# Patient Record
Sex: Female | Born: 1990 | Race: Black or African American | Hispanic: No | Marital: Single | Smoking: Never smoker
Health system: Southern US, Community
[De-identification: ages and names within clinical notes are randomized; demographics above are authoritative.]

## PROBLEM LIST (undated history)

## (undated) ENCOUNTER — Inpatient Hospital Stay (HOSPITAL_COMMUNITY): Payer: Self-pay

## (undated) DIAGNOSIS — A749 Chlamydial infection, unspecified: Secondary | ICD-10-CM

## (undated) DIAGNOSIS — R51 Headache: Secondary | ICD-10-CM

## (undated) DIAGNOSIS — O039 Complete or unspecified spontaneous abortion without complication: Secondary | ICD-10-CM

## (undated) HISTORY — PX: NO PAST SURGERIES: SHX2092

---
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
## 2012-01-02 ENCOUNTER — Encounter (HOSPITAL_COMMUNITY): Payer: Self-pay | Admitting: *Deleted

## 2012-01-02 ENCOUNTER — Inpatient Hospital Stay (HOSPITAL_COMMUNITY)
Admission: AD | Admit: 2012-01-02 | Discharge: 2012-01-03 | Disposition: A | Discharge status: Home / Self Care | Payer: 59 | Source: Ambulatory Visit | Attending: Obstetrics and Gynecology | Admitting: Obstetrics and Gynecology

## 2012-01-02 DIAGNOSIS — O99891 Other specified diseases and conditions complicating pregnancy: Secondary | ICD-10-CM | POA: Insufficient documentation

## 2012-01-02 DIAGNOSIS — O4700 False labor before 37 completed weeks of gestation, unspecified trimester: Secondary | ICD-10-CM

## 2012-01-02 LAB — URINALYSIS, ROUTINE W REFLEX MICROSCOPIC
Hgb urine dipstick: NEGATIVE
Leukocytes, UA: NEGATIVE
Nitrite: NEGATIVE
Protein, ur: NEGATIVE mg/dL
Urobilinogen, UA: 4 mg/dL — ABNORMAL HIGH (ref 0.0–1.0)

## 2012-01-02 NOTE — MAU Note (Signed)
Pt G3 P0 at 33.2wks leaking clear fluid since 2030 and having lower back pain.

## 2012-01-02 NOTE — MAU Provider Note (Signed)
  History     CSN: [id number]  Arrival date and time: 01/02/12 2057   None     Chief Complaint  Patient presents with  . Rupture of Membranes   HPI 21 yo G3P0020 @ 33+2 p/w c/o leaking fluid.  She states she got out of the shower and had a gush of fluid.  She denies continued leaking fluid.  She states she has had some dysuria. Denies vaginal bleeding.  Active fetal movement and ocassional cramping. Her pregnancy has been uncomplicated up to this point. Last sexual activity 1 week ago    Past Medical History  Diagnosis Date  . Headache   . Miscarriage     x2, one at 13weeks, one at [redacted] weeks gestation    Past Surgical History  Procedure Date  . No past surgeries     Family History  Problem Relation Age of Onset  . Hypertension Mother   . Depression Maternal Aunt   . Diabetes Maternal Aunt   . Depression Maternal Uncle   . Diabetes Maternal Uncle   . Asthma Paternal Aunt   . Cancer Paternal Aunt   . Depression Paternal Aunt   . Diabetes Paternal Aunt   . Asthma Paternal Uncle   . Depression Paternal Uncle   . Diabetes Paternal Uncle   . Birth defects Cousin   . Anesthesia problems Neg Hx   . Hypotension Neg Hx   . Malignant hyperthermia Neg Hx   . Pseudochol deficiency Neg Hx     History  Substance Use Topics  . Smoking status: Never Smoker   . Smokeless tobacco: Never Used  . Alcohol Use: Yes     Comment: social    Allergies:  Allergies  Allergen Reactions  . Fruit Blend Other (See Comments)    Sore throat from eating acidic fresh fruits    Prescriptions prior to admission  Medication Sig Dispense Refill  . Prenatal Vit-Fe Fumarate-FA (MULTIVITAMIN-PRENATAL) 27-0.8 MG TABS Take 1 tablet by mouth daily.        ROS: as above Physical Exam   Blood pressure 133/66, pulse 107, temperature 98.2 F (36.8 C), temperature source Oral, resp. rate 18, height 5\' 6"  (1.676 m), weight 92.443 kg (203 lb 12.8 oz), last menstrual period 05/14/2011.  Physical  Exam AOx3, NAD Gravid soft, NT FHT 130-140 reassuring NEFG, perineum dry, 2 small lesions that appear to be molluscum Vagina rugated, scant d/c, no pool Cvx closed, thick, high  WP: fern negative  MAU Course  Procedures 1) WP: fern negative 2) U/A   Assessment and Plan  1) Check U/A 2) No evidence of ROM. If u/a negative with D/C home. If c/w UTI will D/C home with abx  Crystall Donaldson H. 01/02/2012, 9:55 PM

## 2012-01-02 NOTE — MAU Note (Signed)
Pt feels like she broke her water about ago, unsure if fluid continues to leak

## 2012-01-23 NOTE — L&D Delivery Note (Signed)
Delivery Note Called to stand by for delivery as Dr was in another delivery.  When I entered her room, the head was crowning.   At 6:24 PM a viable and healthy female was delivered via  (Presentation:OA ;  ).  APGAR:9/9, ; weight .   Placenta status: Delivered spontaneously and grossly intact with 3VC  No difficulty with shoulders.   Anesthesia:   Episiotomy: None  Lacerations: shallow right labial/perifurethral noted, not bleeding, not repaired.  Suture Repair:  Est. Blood Loss (mL):   Mom to postpartum.  Baby to nursery-stable.  Wynelle Bourgeois 02/22/2012, 6:42 PM

## 2012-01-23 NOTE — L&D Delivery Note (Signed)
Appreciate Marie's attendance.  Pt comfortable and doing well postpartum.  No complaints.  Baby girl doing well.

## 2012-01-24 LAB — OB RESULTS CONSOLE GBS: GBS: NEGATIVE

## 2012-02-16 ENCOUNTER — Inpatient Hospital Stay (HOSPITAL_COMMUNITY)
Admission: AD | Admit: 2012-02-16 | Discharge: 2012-02-16 | Disposition: A | Discharge status: Home / Self Care | Payer: 59 | Source: Ambulatory Visit | Attending: Obstetrics & Gynecology | Admitting: Obstetrics & Gynecology

## 2012-02-16 ENCOUNTER — Encounter (HOSPITAL_COMMUNITY): Payer: Self-pay | Admitting: *Deleted

## 2012-02-16 DIAGNOSIS — O99891 Other specified diseases and conditions complicating pregnancy: Secondary | ICD-10-CM

## 2012-02-16 DIAGNOSIS — N949 Unspecified condition associated with female genital organs and menstrual cycle: Secondary | ICD-10-CM

## 2012-02-16 DIAGNOSIS — N898 Other specified noninflammatory disorders of vagina: Secondary | ICD-10-CM

## 2012-02-16 LAB — WET PREP, GENITAL: Yeast Wet Prep HPF POC: NONE SEEN

## 2012-02-16 MED ORDER — VALACYCLOVIR HCL 500 MG PO TABS: 500.0000 mg | ORAL_TABLET | Freq: Two times a day (BID) | ORAL | Status: DC

## 2012-02-16 NOTE — MAU Note (Signed)
I'm having vaginal irritation and swelling. Some occ abdominal pains

## 2012-02-16 NOTE — MAU Provider Note (Signed)
History     CSN: [id number]  Arrival date and time: 02/16/12 2131   None     Chief Complaint  Patient presents with  . Vaginal Pain   HPI  Pt is a G3P0020 at 38.4 wks IUP here with report that perineum is swollen and burns x 1 day.  Last intercourse three days ago.  Denies HSV or MRSA.  No report of itching.    Past Medical History  Diagnosis Date  . Headache   . Miscarriage     x2, one at 13weeks, one at [redacted] weeks gestation    Past Surgical History  Procedure Date  . No past surgeries     Family History  Problem Relation Age of Onset  . Hypertension Mother   . Depression Maternal Aunt   . Diabetes Maternal Aunt   . Depression Maternal Uncle   . Diabetes Maternal Uncle   . Asthma Paternal Aunt   . Cancer Paternal Aunt   . Depression Paternal Aunt   . Diabetes Paternal Aunt   . Asthma Paternal Uncle   . Depression Paternal Uncle   . Diabetes Paternal Uncle   . Birth defects Cousin   . Anesthesia problems Neg Hx   . Hypotension Neg Hx   . Malignant hyperthermia Neg Hx   . Pseudochol deficiency Neg Hx     History  Substance Use Topics  . Smoking status: Former Games developer  . Smokeless tobacco: Never Used  . Alcohol Use: Yes     Comment: social    Allergies:  Allergies  Allergen Reactions  . Fruit Blend Other (See Comments)    Sore throat from eating acidic fresh fruits    Prescriptions prior to admission  Medication Sig Dispense Refill  . guaiFENesin (ROBITUSSIN) 100 MG/5ML liquid Take 200 mg by mouth 3 (three) times daily as needed. cough      . Prenatal Vit-Fe Fumarate-FA (PRENATAL MULTIVITAMIN) TABS Take 1 tablet by mouth daily.        Review of Systems  Genitourinary:       Vaginal pain and burning  All other systems reviewed and are negative.   Physical Exam   Blood pressure 132/72, pulse 89, temperature 97.9 F (36.6 C), temperature source Oral, resp. rate 20, height 5' 7.5" (1.715 m), weight 95.8 kg (211 lb 3.2 oz), last menstrual period  05/14/2011.  Physical Exam  Constitutional: She is oriented to person, place, and time. She appears well-developed and well-nourished. No distress.  HENT:  Head: Normocephalic.  Neck: Normal range of motion. Neck supple.  Cardiovascular: Normal rate, regular rhythm and normal heart sounds.   Respiratory: Effort normal and breath sounds normal.  GI: Soft. There is no tenderness.  Genitourinary:    No bleeding around the vagina. Vaginal discharge (thick white) found.  Neurological: She is alert and oriented to person, place, and time.  Skin: Skin is warm and dry.  FHR 130's, +accels Toco - None  MAU Course  Procedures Results for orders placed during the hospital encounter of 02/16/12 (from the past 24 hour(s))  WET PREP, GENITAL     Status: Abnormal   Collection Time   02/16/12 10:15 PM      Component Value Range   Yeast Wet Prep HPF POC NONE SEEN  NONE SEEN   Trich, Wet Prep NONE SEEN  NONE SEEN   Clue Cells Wet Prep HPF POC NONE SEEN  NONE SEEN   WBC, Wet Prep HPF POC FEW (*) NONE SEEN  Consulted with Dr. Arlyce Dice > reviewed HPI/exam > begin Valtrex BID with follow-up in office.  Assessment and Plan   Vaginal Lesion   Plan: DC to home RX Valtrex HSV Culture pending Pt education provided.  [facility] 02/16/2012, 10:06 PM

## 2012-02-16 NOTE — MAU Note (Signed)
PT SAYS HER PERINEUM  IS SWOLLEN  AND BURNS- STARTED Friday NIGHT-  LAST SEX - ON WED .   DENIES HSV AND MRSA.   NO ITCHING.

## 2012-02-18 LAB — HERPES SIMPLEX VIRUS CULTURE

## 2012-02-22 ENCOUNTER — Inpatient Hospital Stay (HOSPITAL_COMMUNITY)
Admission: AD | Admit: 2012-02-22 | Discharge: 2012-02-24 | DRG: 775 | Disposition: A | Discharge status: Home / Self Care | Payer: 59 | Source: Ambulatory Visit | Attending: Obstetrics and Gynecology | Admitting: Obstetrics and Gynecology

## 2012-02-22 ENCOUNTER — Encounter (HOSPITAL_COMMUNITY): Payer: Self-pay | Admitting: Anesthesiology

## 2012-02-22 ENCOUNTER — Inpatient Hospital Stay (HOSPITAL_COMMUNITY): Payer: 59 | Admitting: Anesthesiology

## 2012-02-22 ENCOUNTER — Encounter (HOSPITAL_COMMUNITY): Payer: Self-pay | Admitting: *Deleted

## 2012-02-22 DIAGNOSIS — N898 Other specified noninflammatory disorders of vagina: Secondary | ICD-10-CM

## 2012-02-22 HISTORY — DX: Chlamydial infection, unspecified: A74.9

## 2012-02-22 LAB — COMPREHENSIVE METABOLIC PANEL
ALT: 10 U/L (ref 0–35)
Alkaline Phosphatase: 114 U/L (ref 39–117)
CO2: 22 mEq/L (ref 19–32)
Chloride: 102 mEq/L (ref 96–112)
GFR calc Af Amer: >90 mL/min (ref >90)
GFR calc non Af Amer: >90 mL/min (ref >90)
Glucose, Bld: 89 mg/dL (ref 70–99)
Potassium: 3.7 mEq/L (ref 3.5–5.1)
Sodium: 134 mEq/L — ABNORMAL LOW (ref 135–145)

## 2012-02-22 LAB — CBC
Hemoglobin: 10 g/dL — ABNORMAL LOW (ref 12.0–15.0)
MCH: 30.1 pg (ref 26.0–34.0)
RBC: 3.32 MIL/uL — ABNORMAL LOW (ref 3.87–5.11)

## 2012-02-22 LAB — URINALYSIS, DIPSTICK ONLY
Bilirubin Urine: NEGATIVE
Nitrite: NEGATIVE
Specific Gravity, Urine: 1.025 (ref 1.005–1.030)
pH: 7 (ref 5.0–8.0)

## 2012-02-22 MED ORDER — CITRIC ACID-SODIUM CITRATE 334-500 MG/5ML PO SOLN: 30.0000 mL | ORAL | Status: DC | PRN

## 2012-02-22 MED ORDER — ACETAMINOPHEN 325 MG PO TABS: 650.0000 mg | ORAL_TABLET | ORAL | Status: DC | PRN

## 2012-02-22 MED ORDER — FENTANYL 2.5 MCG/ML BUPIVACAINE 1/10 % EPIDURAL INFUSION (WH - ANES)
14.0000 mL/h | INTRAMUSCULAR | Status: DC
  Filled 2012-02-22: qty 125

## 2012-02-22 MED ORDER — PRENATAL MULTIVITAMIN CH
1.0000 | ORAL_TABLET | Freq: Every day | ORAL | Status: DC
  Administered 2012-02-24: 1 via ORAL
  Filled 2012-02-22 (×2): qty 1

## 2012-02-22 MED ORDER — TETANUS-DIPHTH-ACELL PERTUSSIS 5-2.5-18.5 LF-MCG/0.5 IM SUSP
0.5000 mL | Freq: Once | INTRAMUSCULAR | Status: AC
  Administered 2012-02-24: 0.5 mL via INTRAMUSCULAR

## 2012-02-22 MED ORDER — WITCH HAZEL-GLYCERIN EX PADS: 1.0000 "application " | MEDICATED_PAD | CUTANEOUS | Status: DC | PRN

## 2012-02-22 MED ORDER — ZOLPIDEM TARTRATE 5 MG PO TABS: 5.0000 mg | ORAL_TABLET | Freq: Every evening | ORAL | Status: DC | PRN

## 2012-02-22 MED ORDER — BENZOCAINE-MENTHOL 20-0.5 % EX AERO
1.0000 "application " | INHALATION_SPRAY | CUTANEOUS | Status: DC | PRN
  Administered 2012-02-22 – 2012-02-24 (×2): 1 via TOPICAL
  Filled 2012-02-22 (×2): qty 56

## 2012-02-22 MED ORDER — LACTATED RINGERS IV SOLN
500.0000 mL | Freq: Once | INTRAVENOUS | Status: AC
  Administered 2012-02-22: 500 mL via INTRAVENOUS

## 2012-02-22 MED ORDER — IBUPROFEN 600 MG PO TABS: 600.0000 mg | ORAL_TABLET | Freq: Four times a day (QID) | ORAL | Status: DC | PRN

## 2012-02-22 MED ORDER — PHENYLEPHRINE 40 MCG/ML (10ML) SYRINGE FOR IV PUSH (FOR BLOOD PRESSURE SUPPORT): 80.0000 ug | PREFILLED_SYRINGE | INTRAVENOUS | Status: DC | PRN

## 2012-02-22 MED ORDER — ONDANSETRON HCL 4 MG/2ML IJ SOLN: 4.0000 mg | INTRAMUSCULAR | Status: DC | PRN

## 2012-02-22 MED ORDER — LIDOCAINE HCL (PF) 1 % IJ SOLN
INTRAMUSCULAR | Status: DC | PRN
  Administered 2012-02-22 (×2): 9 mL

## 2012-02-22 MED ORDER — ONDANSETRON HCL 4 MG PO TABS: 4.0000 mg | ORAL_TABLET | ORAL | Status: DC | PRN

## 2012-02-22 MED ORDER — IBUPROFEN 600 MG PO TABS
600.0000 mg | ORAL_TABLET | Freq: Four times a day (QID) | ORAL | Status: DC
  Administered 2012-02-23 – 2012-02-24 (×7): 600 mg via ORAL
  Filled 2012-02-22 (×7): qty 1

## 2012-02-22 MED ORDER — ONDANSETRON HCL 4 MG/2ML IJ SOLN: 4.0000 mg | Freq: Four times a day (QID) | INTRAMUSCULAR | Status: DC | PRN

## 2012-02-22 MED ORDER — DIPHENHYDRAMINE HCL 25 MG PO CAPS: 25.0000 mg | ORAL_CAPSULE | Freq: Four times a day (QID) | ORAL | Status: DC | PRN

## 2012-02-22 MED ORDER — DIPHENHYDRAMINE HCL 50 MG/ML IJ SOLN: 12.5000 mg | INTRAMUSCULAR | Status: DC | PRN

## 2012-02-22 MED ORDER — SIMETHICONE 80 MG PO CHEW: 80.0000 mg | CHEWABLE_TABLET | ORAL | Status: DC | PRN

## 2012-02-22 MED ORDER — EPHEDRINE 5 MG/ML INJ: 10.0000 mg | INTRAVENOUS | Status: DC | PRN

## 2012-02-22 MED ORDER — OXYCODONE-ACETAMINOPHEN 5-325 MG PO TABS
1.0000 | ORAL_TABLET | ORAL | Status: DC | PRN
  Administered 2012-02-23: 1 via ORAL
  Filled 2012-02-22: qty 1

## 2012-02-22 MED ORDER — DIBUCAINE 1 % RE OINT: 1.0000 "application " | TOPICAL_OINTMENT | RECTAL | Status: DC | PRN

## 2012-02-22 MED ORDER — SENNOSIDES-DOCUSATE SODIUM 8.6-50 MG PO TABS
2.0000 | ORAL_TABLET | Freq: Every day | ORAL | Status: DC
  Administered 2012-02-23 (×2): 2 via ORAL

## 2012-02-22 MED ORDER — PHENYLEPHRINE 40 MCG/ML (10ML) SYRINGE FOR IV PUSH (FOR BLOOD PRESSURE SUPPORT)
80.0000 ug | PREFILLED_SYRINGE | INTRAVENOUS | Status: DC | PRN
  Filled 2012-02-22: qty 5

## 2012-02-22 MED ORDER — LACTATED RINGERS IV SOLN
INTRAVENOUS | Status: DC
  Administered 2012-02-22: 16:00:00 via INTRAVENOUS

## 2012-02-22 MED ORDER — OXYTOCIN 40 UNITS IN LACTATED RINGERS INFUSION - SIMPLE MED
62.5000 mL/h | INTRAVENOUS | Status: DC
  Filled 2012-02-22: qty 1000

## 2012-02-22 MED ORDER — EPHEDRINE 5 MG/ML INJ
10.0000 mg | INTRAVENOUS | Status: DC | PRN
  Filled 2012-02-22: qty 4

## 2012-02-22 MED ORDER — LANOLIN HYDROUS EX OINT: TOPICAL_OINTMENT | CUTANEOUS | Status: DC | PRN

## 2012-02-22 MED ORDER — OXYCODONE-ACETAMINOPHEN 5-325 MG PO TABS: 1.0000 | ORAL_TABLET | ORAL | Status: DC | PRN

## 2012-02-22 MED ORDER — FENTANYL 2.5 MCG/ML BUPIVACAINE 1/10 % EPIDURAL INFUSION (WH - ANES)
INTRAMUSCULAR | Status: DC | PRN
  Administered 2012-02-22: 14 mL/h via EPIDURAL

## 2012-02-22 MED ORDER — OXYTOCIN BOLUS FROM INFUSION: 500.0000 mL | INTRAVENOUS | Status: DC

## 2012-02-22 MED ORDER — LACTATED RINGERS IV SOLN: 500.0000 mL | INTRAVENOUS | Status: DC | PRN

## 2012-02-22 MED ORDER — LIDOCAINE HCL (PF) 1 % IJ SOLN
30.0000 mL | INTRAMUSCULAR | Status: DC | PRN
  Filled 2012-02-22: qty 30

## 2012-02-22 NOTE — Anesthesia Preprocedure Evaluation (Signed)
Anesthesia Evaluation  Patient identified by MRN, date of birth, ID band Patient awake    Reviewed: Allergy & Precautions, H&P , NPO status , Patient's Chart, lab work & pertinent test results  Airway Mallampati: II TM Distance: >3 FB Neck ROM: full    Dental No notable dental hx.    Pulmonary neg pulmonary ROS,    Pulmonary exam normal       Cardiovascular negative cardio ROS      Neuro/Psych negative psych ROS   GI/Hepatic negative GI ROS, Neg liver ROS,   Endo/Other  negative endocrine ROS  Renal/GU negative Renal ROS  negative genitourinary   Musculoskeletal negative musculoskeletal ROS (+)   Abdominal Normal abdominal exam  (+)   Peds negative pediatric ROS (+)  Hematology negative hematology ROS (+)   Anesthesia Other Findings   Reproductive/Obstetrics (+) Pregnancy                           Anesthesia Physical Anesthesia Plan  ASA: II  Anesthesia Plan: Epidural   Post-op Pain Management:    Induction:   Airway Management Planned:   Additional Equipment:   Intra-op Plan:   Post-operative Plan:   Informed Consent: I have reviewed the patients History and Physical, chart, labs and discussed the procedure including the risks, benefits and alternatives for the proposed anesthesia with the patient or authorized representative who has indicated his/her understanding and acceptance.     Plan Discussed with:   Anesthesia Plan Comments:         Anesthesia Quick Evaluation  

## 2012-02-22 NOTE — Anesthesia Procedure Notes (Signed)
Epidural Patient location during procedure: OB Start time: 02/22/2012 3:17 PM End time: 02/22/2012 3:20 PM  Staffing Anesthesiologist: Sandrea Hughs Performed by: anesthesiologist   Preanesthetic Checklist Completed: patient identified, site marked, surgical consent, pre-op evaluation, timeout performed, IV checked, risks and benefits discussed and monitors and equipment checked  Epidural Patient position: sitting Prep: site prepped and draped and DuraPrep Patient monitoring: continuous pulse ox and blood pressure Approach: midline Injection technique: LOR air  Needle:  Needle type: Tuohy  Needle gauge: 17 G Needle length: 9 cm and 9 Needle insertion depth: 7 cm Catheter type: closed end flexible Catheter size: 19 Gauge Catheter at skin depth: 12 cm Test dose: negative and Other  Assessment Sensory level: T8 Events: blood not aspirated, injection not painful, no injection resistance, negative IV test and no paresthesia  Additional Notes Reason for block:procedure for pain

## 2012-02-22 NOTE — MAU Note (Signed)
Patient states she is having contractions every 5 minutes. Denies bleeding or leaking. Reports good fetal movement.  

## 2012-02-22 NOTE — MAU Note (Signed)
UC's since 0500, intense. States MD stripped membranes 2 days and was 1cm.

## 2012-02-22 NOTE — H&P (Signed)
22 y.o. [redacted]w[redacted]d  G3P0020 comes in c/o ctx.  Otherwise has good fetal movement and no bleeding.  Past Medical History  Diagnosis Date  . Headache   . Miscarriage     x2, one at 13weeks, one at [redacted] weeks gestation  . Chlamydia     Past Surgical History  Procedure Date  . No past surgeries     OB History    Grav Para Term Preterm Abortions TAB SAB Ect Mult Living   3 0 0 0 2 0 2 0 0 0      # Outc Date GA Lbr Len/2nd Wgt Sex Del Anes PTL Lv   1 SAB            2 SAB            3 CUR               History   Social History  . Marital Status: Single    Spouse Name: N/A    Number of Children: N/A  . Years of Education: N/A   Occupational History  . Not on file.   Social History Main Topics  . Smoking status: Former Games developer  . Smokeless tobacco: Never Used  . Alcohol Use: Yes     Comment: social  . Drug Use: No  . Sexually Active: Yes    Birth Control/ Protection: None   Other Topics Concern  . Not on file   Social History Narrative  . No narrative on file   Fruit blend    Prenatal Transfer Tool  Maternal Diabetes: No Genetic Screening: Normal Fetal Ultrasounds or other Referrals:  Other:  no abnormalities note for anatomy scan Maternal Substance Abuse:  No Significant Maternal Medications:  None Significant Maternal Lab Results: Lab values include: Group B Strep negative  Other PNC:    Filed Vitals:   02/22/12 1546  BP: 93/49  Pulse: 108  Temp:   Resp:      Lungs/Cor:  NAD Abdomen:  soft, gravid Ex:  no cords, erythema SVE: 4.5/80/-1, now 7/100/0 FHTs:  125, good STV, NST R Toco:  q2-3   A/P   Admit in labor  GBS neg  Epidural received  AROM clear  Routine care  [place], Darlene Livingston

## 2012-02-23 LAB — CBC
HCT: 26.3 % — ABNORMAL LOW (ref 36.0–46.0)
Hemoglobin: 8.9 g/dL — ABNORMAL LOW (ref 12.0–15.0)
RBC: 2.97 MIL/uL — ABNORMAL LOW (ref 3.87–5.11)

## 2012-02-23 MED ORDER — INFLUENZA VIRUS VACC SPLIT PF IM SUSP
0.5000 mL | Freq: Once | INTRAMUSCULAR | Status: AC
  Administered 2012-02-23: 0.5 mL via INTRAMUSCULAR
  Filled 2012-02-23: qty 0.5

## 2012-02-23 NOTE — Anesthesia Postprocedure Evaluation (Signed)
Anesthesia Post Note  Patient: Darlene Livingston  Procedure(s) Performed: * No procedures listed *  Anesthesia type: Epidural  Patient location: Mother/Baby  Post pain: Pain level controlled  Post assessment: Post-op Vital signs reviewed  Last Vitals:  Filed Vitals:   02/23/12 0525  BP: 126/85  Pulse: 80  Temp: 36.4 C  Resp: 18    Post vital signs: Reviewed  Level of consciousness:alert  Complications: No apparent anesthesia complications

## 2012-02-24 MED ORDER — IBUPROFEN 600 MG PO TABS: 600.0000 mg | ORAL_TABLET | Freq: Four times a day (QID) | ORAL | Status: DC

## 2012-02-24 MED ORDER — OXYCODONE-ACETAMINOPHEN 5-325 MG PO TABS: 1.0000 | ORAL_TABLET | ORAL | Status: DC | PRN

## 2012-02-24 NOTE — Discharge Summary (Signed)
Obstetric Discharge Summary Reason for Admission: onset of labor Prenatal Procedures: none Intrapartum Procedures: spontaneous vaginal delivery Postpartum Procedures: none Complications-Operative and Postpartum: none Hemoglobin  Date Value Range Status  02/23/2012 8.9* 12.0 - 15.0 g/dL Final     HCT  Date Value Range Status  02/23/2012 26.3* 36.0 - 46.0 % Final    Physical Exam:  General: alert and cooperative Lochia: appropriate Uterine Fundus: firm DVT Evaluation: No evidence of DVT seen on physical exam.  Discharge Diagnoses: Term Pregnancy-delivered  Discharge Information: Date: 02/24/2012 Activity: pelvic rest Diet: routine Medications: PNV, Ibuprofen and Percocet Condition: stable Instructions: refer to practice specific booklet Discharge to: home Follow-up Information    Follow up with Rae Sutcliffe, DO. In 4 weeks.   Contact information:   [address] [place] [address] [phone]          Newborn Data: Live born female  Birth Weight: 6 lb 2.9 oz (2805 g) APGAR: 9, 9  Home with mother.  Darlene Livingston 02/24/2012, 7:44 AM

## 2012-04-07 ENCOUNTER — Emergency Department (HOSPITAL_COMMUNITY)
Admission: EM | Admit: 2012-04-07 | Discharge: 2012-04-07 | Disposition: A | Discharge status: Home / Self Care | Payer: 59 | Attending: Emergency Medicine | Admitting: Emergency Medicine

## 2012-04-07 ENCOUNTER — Encounter (HOSPITAL_COMMUNITY): Payer: Self-pay | Admitting: Emergency Medicine

## 2012-04-07 DIAGNOSIS — D1800 Hemangioma unspecified site: Secondary | ICD-10-CM

## 2012-04-07 DIAGNOSIS — Z8619 Personal history of other infectious and parasitic diseases: Secondary | ICD-10-CM | POA: Insufficient documentation

## 2012-04-07 DIAGNOSIS — D1801 Hemangioma of skin and subcutaneous tissue: Secondary | ICD-10-CM | POA: Insufficient documentation

## 2012-04-07 DIAGNOSIS — Z87891 Personal history of nicotine dependence: Secondary | ICD-10-CM | POA: Insufficient documentation

## 2012-04-07 MED ORDER — HYDROCODONE-ACETAMINOPHEN 5-325 MG PO TABS
1.0000 | ORAL_TABLET | Freq: Once | ORAL | Status: AC
  Administered 2012-04-07: 1 via ORAL
  Filled 2012-04-07: qty 1

## 2012-04-07 NOTE — ED Provider Notes (Signed)
History     CSN: [id number]  Arrival date & time 04/07/12  0006   First MD Initiated Contact with Patient 04/07/12 0106      Chief Complaint  Patient presents with  . Headache   HPI  History provided by the patient. Patient is a 22 year old female with history of migraine headaches who presents with 2 complaints of migraine headache as well as right scalp irritation from a birthmark. Patient states she has a birthmark to her right scalp since she was born but normally stays flat with redness but in the past has caused a small area of growth which she had removed by a dermatologist. She states that over the past several months there has been a new growth to the area which recently became irritated and cut with bleeding. She has had some bleeding every time she rubs the area when she sleeps or combs her hair. Patient also states this irritation has caused her to have her migraine headaches which have been unrelieved by ibuprofen at home. She denies any other associated symptoms or complaints. No fever, chills or sweats. No numbness weakness in extremities.    Past Medical History  Diagnosis Date  . Headache   . Miscarriage     x2, one at 13weeks, one at [redacted] weeks gestation  . Chlamydia     Past Surgical History  Procedure Laterality Date  . No past surgeries      Family History  Problem Relation Age of Onset  . Hypertension Mother   . Depression Maternal Aunt   . Diabetes Maternal Aunt   . Depression Maternal Uncle   . Diabetes Maternal Uncle   . Asthma Paternal Aunt   . Cancer Paternal Aunt   . Depression Paternal Aunt   . Diabetes Paternal Aunt   . Asthma Paternal Uncle   . Depression Paternal Uncle   . Diabetes Paternal Uncle   . Birth defects Cousin   . Anesthesia problems Neg Hx   . Hypotension Neg Hx   . Malignant hyperthermia Neg Hx   . Pseudochol deficiency Neg Hx     History  Substance Use Topics  . Smoking status: Former Games developer  . Smokeless tobacco: Never  Used  . Alcohol Use: Yes     Comment: social    OB History   Grav Para Term Preterm Abortions TAB SAB Ect Mult Living   3 1 1  0 2 0 2 0 0 1      Review of Systems  Constitutional: Negative for fever and chills.  Neurological: Positive for headaches.  Psychiatric/Behavioral: Negative for confusion.  All other systems reviewed and are negative.    Allergies  Fruit blend  Home Medications   Current Outpatient Rx  Name  Route  Sig  Dispense  Refill  . ibuprofen (ADVIL,MOTRIN) 600 MG tablet   Oral   Take 1 tablet (600 mg total) by mouth every 6 (six) hours.   30 tablet   0   . oxyCODONE-acetaminophen (PERCOCET/ROXICET) 5-325 MG per tablet   Oral   Take 1-2 tablets by mouth every 4 (four) hours as needed (moderate - severe pain).   6 tablet   0   . Prenatal Vit-Fe Fumarate-FA (PRENATAL MULTIVITAMIN) TABS   Oral   Take 1 tablet by mouth daily.           BP 121/70  Pulse 68  Temp(Src) 98.6 F (37 C) (Oral)  Resp 18  Ht 5\' 7"  (1.702 m)  Wt 190  lb (86.183 kg)  BMI 29.75 kg/m2  SpO2 100%  LMP 04/06/2012  Breastfeeding? No  Physical Exam  Nursing note and vitals reviewed. Constitutional: She is oriented to person, place, and time. She appears well-developed and well-nourished. No distress.  HENT:  Head: Normocephalic and atraumatic.  Eyes: Conjunctivae and EOM are normal. Pupils are equal, round, and reactive to light.  Neck: Normal range of motion. Neck supple.  No meningeal signs  Cardiovascular: Normal rate and regular rhythm.   No murmur heard. Pulmonary/Chest: Effort normal and breath sounds normal. No respiratory distress. She has no wheezes. She has no rales.  Abdominal: Soft. There is no tenderness. There is no rigidity, no rebound, no guarding, no CVA tenderness and no tenderness at McBurney's point.  Neurological: She is alert and oriented to person, place, and time. She has normal strength. No cranial nerve deficit or sensory deficit. Gait normal.   Skin: Skin is warm and dry. No rash noted.  Erythematous patch to the right parietal scalp consistent with history of hemangioma. There is a small 3 mm pedunculated flesh-colored growth to the superior posterior aspect of this with underlying dry blood. No active bleeding. There is no fluctuance to the skin. No signs concerning for secondary skin infection.  Psychiatric: She has a normal mood and affect. Her behavior is normal.    ED Course  Procedures     1. Hemangioma       MDM  Patient seen and evaluated. Patient well-appearing in no acute distress. Normal nonfocal neuro exam.          Angus Seller, PA-C 04/07/12 0151

## 2012-04-07 NOTE — ED Notes (Signed)
Pt states she has a birth mark in hair that is growing and causing her to have headaches. Pt states she had it removed when she was 11-20 yrs old, since being pregnant area has grown significantly. PWD. VSS.

## 2012-04-07 NOTE — ED Provider Notes (Signed)
Medical screening examination/treatment/procedure(s) were performed by non-physician practitioner and as supervising physician I was immediately available for consultation/collaboration.  Laiana Fratus K Linker, MD 04/07/12 0156 

## 2012-04-15 ENCOUNTER — Encounter: Payer: Self-pay | Admitting: Medical

## 2012-04-15 ENCOUNTER — Telehealth: Payer: Self-pay | Admitting: Family Medicine

## 2012-04-15 ENCOUNTER — Ambulatory Visit (INDEPENDENT_AMBULATORY_CARE_PROVIDER_SITE_OTHER): Payer: 59 | Admitting: Medical

## 2012-04-15 VITALS — BP 110/80 | HR 68 | Temp 98.3°F | Resp 16 | Wt 194.0 lb

## 2012-04-15 DIAGNOSIS — L42 Pityriasis rosea: Secondary | ICD-10-CM

## 2012-04-15 DIAGNOSIS — L989 Disorder of the skin and subcutaneous tissue, unspecified: Secondary | ICD-10-CM

## 2012-04-15 NOTE — Patient Instructions (Signed)
Pityriasis Rosea - the rash on back, chest, abdomen, arms.  This will resolve on its on in several weeks.   It may get worse appearing before it gets better.   If the itching is real bad, you can use short term Benadryl OTC, 1/2-1 tablet (up to 25mg ), 3 times daily, but I would limit this.   Cool packs can help with itching.   Aveeno lotion or Lubriderm moisturizing lotion can also help with the itchy rash.  We will refer you back to dermatology to recheck the scalp lesion.  What is pityriasis rosea? Pityriasis rosea (say "pih-tih-RY-uh-sus ROH-zee-uh") is a common skin problem that causes a rash. Although it can occur at any age, it is seen most often in those between the ages of 51 and 47.  Pityriasis rosea is usually harmless. But it can cause serious problems in pregnant women. See a picture of pityriasis rosea. What causes pityriasis rosea? Experts aren't sure what causes pityriasis rosea. Unlike many other skin conditions, it is not an allergic reaction or caused by a fungus or bacteria. And there aren't signs that it is caused by a virus. But something irritates the skin and causes the rash. What are the symptoms? Pityriasis rosea causes a rash. The rash often begins with a single, round or oval, pink patch that is scaly with a raised border (herald patch). The size of the patch ranges from 2 cm (0.8 in.) to 10 cm (3.9 in.). The larger patches are more common. See a picture of a herald patch.  Days to weeks later, salmon-colored, 1 cm (0.4 in.) to 2 cm (0.8 in.) oval patches appear in batches on the abdomen, chest, back, arms, and legs. Patches sometimes spread to the neck but rarely to the face.  Patches on the back are often vertical and angled to form a "Christmas tree" or "fir tree" appearance.  The rash does not cause itching in 25% of people who have pityriasis rosea. For 50% of people, the itch is mild to moderate. And for 25% of people, the itch is severe.1  The rash usually lasts 6  to 8 weeks, but it can last up to several months. The rash may take other forms. Rounded bumps (papular rash) may be seen in young children, pregnant women, and people with dark skin. Blisters (vesicular rash) may be seen in infants and young children. In some people, the herald patch may not appear, or two herald patches may appear close together. Before the herald patch appears, you may feel tired and as though you have a cold. You may have a headache, nausea, sore throat, and loss of appetite. The pityriasis rosea rash is similar to the rash seen in other skin conditions, including ringworm of the skin, tinea versicolor, eczema, and psoriasis. A rash similar to pityriasis rosea also can be caused by syphilis and by certain medicines such as antibiotics. If you get a rash on the palms of your hands or the soles of your feet, see your doctor. This can be a sign of something more serious than pityriasis rosea. How is pityriasis rosea diagnosed? Your doctor will diagnose pityriasis rosea by looking at the rash. Diagnosis can be hard when only the herald patch is visible, because the condition is often mistaken for ringworm or eczema at this time. After the rash appears, diagnosis is generally clear. If the diagnosis is unclear, your doctor may do a potassium hydroxide (KOH) test to make sure the rash is not caused by a  fungal infection. A skin sample may be taken from the infected area and examined under the microscope (biopsy). If the diagnosis is unclear in a sexually active person, a test for syphilis is often done.

## 2012-04-15 NOTE — Telephone Encounter (Signed)
Message copied by Janeice Robinson on Tue Apr 15, 2012  4:00 PM ------      Message from: Jac Canavan      Created: Tue Apr 15, 2012  1:07 PM       Refer to dermatology.  She has seen derm on [address] in the past, but wants whoever will see her soonest, [place], [place], etc.             Scalp lesion. ------

## 2012-04-15 NOTE — Telephone Encounter (Signed)
Patient is aware of her appointment to see Darlene Livingston on 04/17/12 @ 845 pm at [facility]. CLS [phone]

## 2012-04-15 NOTE — Progress Notes (Signed)
Subjective: Here as a new patient for skin concerns.  She is breastfeeding, just had baby 02/22/12.    She reports breaking out rash on chest, stomach, back, and arms.  Rash x 2 wk, worsening.  Rash is itching.   Has had this before.   She saw dermatology on [address] in the past, was diagnosed with "rosacea."  Diagnosis 1-2 year ago.  At that time was advised to use Aveeno.  She reports that rash started with a flat itch spot on her back, then on chest, then abdomen, low back, arms.  She notes some redness, itching.  No scaling, no pus, no warmth.  She has second skin concern.  Hx/o "birthmark" on right scalp, has 2 growth in the area that are crusting and getting bigger, and at times painful and flaky. Had this happen when she was young, had this cut off.   Now there are 2 growths.    Past Medical History  Diagnosis Date  . Headache   . Miscarriage     x2, one at 13weeks, one at [redacted] weeks gestation  . Chlamydia    ROS as in subjective   Objective: Gen: wd, wn, nad, AA female Skin: right parietal scalp with elongated slightly raised broad flat pink lesions, somewhat thickened compared to surrounding skin with 2 separate pedunculated crusting lesions with separation at the base.  Given all the crusted debris intermixed within the hair, hard to appreciate the pedunculated growths.  The flat pink base and 2 growths on top of the base are all within hairline.  Possible herald patch on left mid back, roundish flat, pink/red lesion, similar smaller scattered lesions on lower back, lower abdomen, left breast laterally, forearms.    Assessment: Encounter Diagnoses  Name Primary?  . Pityriasis rosea Yes  . Scalp lesion   . Lactating mother    Plan: pityriasis rosea - discussed findings, diagnosis, reassured she didn't have Rosacea.  Advised aveeno or lubriderm cream for itching, cool packs, and if needed, occasional low dose benadryl oral OTC.  Gave handout on diagnosis, and advised that this  will resolve spontaneously in the weeks to come.  Scalp lesion - refer to dermatology for eval and management.  Unclear etiology, and growing irritated lesion.   Differential includes seborrheic dermatitis, verruca, hemangioma, AK, other.

## 2012-05-03 ENCOUNTER — Emergency Department (HOSPITAL_COMMUNITY): Payer: 59

## 2012-05-03 ENCOUNTER — Emergency Department (HOSPITAL_COMMUNITY)
Admission: EM | Admit: 2012-05-03 | Discharge: 2012-05-04 | Disposition: A | Discharge status: Home / Self Care | Payer: 59 | Attending: Emergency Medicine | Admitting: Emergency Medicine

## 2012-05-03 ENCOUNTER — Encounter (HOSPITAL_COMMUNITY): Payer: Self-pay | Admitting: *Deleted

## 2012-05-03 DIAGNOSIS — Y9389 Activity, other specified: Secondary | ICD-10-CM | POA: Insufficient documentation

## 2012-05-03 DIAGNOSIS — W1809XA Striking against other object with subsequent fall, initial encounter: Secondary | ICD-10-CM | POA: Insufficient documentation

## 2012-05-03 DIAGNOSIS — Y92009 Unspecified place in unspecified non-institutional (private) residence as the place of occurrence of the external cause: Secondary | ICD-10-CM | POA: Insufficient documentation

## 2012-05-03 DIAGNOSIS — S8001XA Contusion of right knee, initial encounter: Secondary | ICD-10-CM

## 2012-05-03 DIAGNOSIS — S8000XA Contusion of unspecified knee, initial encounter: Secondary | ICD-10-CM | POA: Insufficient documentation

## 2012-05-03 DIAGNOSIS — Z87891 Personal history of nicotine dependence: Secondary | ICD-10-CM | POA: Insufficient documentation

## 2012-05-03 DIAGNOSIS — Z8679 Personal history of other diseases of the circulatory system: Secondary | ICD-10-CM | POA: Insufficient documentation

## 2012-05-03 DIAGNOSIS — Z8619 Personal history of other infectious and parasitic diseases: Secondary | ICD-10-CM | POA: Insufficient documentation

## 2012-05-03 DIAGNOSIS — Z8742 Personal history of other diseases of the female genital tract: Secondary | ICD-10-CM | POA: Insufficient documentation

## 2012-05-03 NOTE — ED Notes (Signed)
Pt states she fell off her bed this evening and hit her right knee on bed frame.  Pt states she has now has sharp, aching pain to right knee and is unable to bear weight on leg without pain.  Pt states pain is located in medial right knee.  No obvious swelling noted.

## 2012-05-03 NOTE — ED Provider Notes (Signed)
History    This chart was scribed for Darlene Livingston, a non-physician practitioner working with Raeford Razor, MD by Lewanda Rife, ED Scribe. This patient was seen in room WTR9/WTR9 and the patient's care was started at 2342.    CSN: [id number]  Arrival date & time 05/03/12  2227   First MD Initiated Contact with Patient 05/03/12 2320      Chief Complaint  Patient presents with  . Knee Pain    right    (Consider location/radiation/quality/duration/timing/severity/associated sxs/prior treatment) The history is provided by the patient.   Darlene Livingston is a 22 y.o. female who presents to the Emergency Department complaining of constant moderate right knee pain onset PTA. Pt reports falling off her bed and hitting her right knee on the frame. Pt denies previous knee injury. Pt denies any alleviating or aggravating factors. Pt denies taking any pain medications at home PTA.   Past Medical History  Diagnosis Date  . Headache   . Miscarriage     x2, one at 13weeks, one at [redacted] weeks gestation  . Chlamydia     Past Surgical History  Procedure Laterality Date  . No past surgeries      Family History  Problem Relation Age of Onset  . Hypertension Mother   . Depression Maternal Aunt   . Diabetes Maternal Aunt   . Depression Maternal Uncle   . Diabetes Maternal Uncle   . Asthma Paternal Aunt   . Cancer Paternal Aunt   . Depression Paternal Aunt   . Diabetes Paternal Aunt   . Asthma Paternal Uncle   . Depression Paternal Uncle   . Diabetes Paternal Uncle   . Birth defects Cousin   . Anesthesia problems Neg Hx   . Hypotension Neg Hx   . Malignant hyperthermia Neg Hx   . Pseudochol deficiency Neg Hx     History  Substance Use Topics  . Smoking status: Former Games developer  . Smokeless tobacco: Never Used  . Alcohol Use: Yes     Comment: social    OB History   Grav Para Term Preterm Abortions TAB SAB Ect Mult Living   3 1 1  0 2 0 2 0 0 1      Review of  Systems A complete 10 system review of systems was obtained and all systems are negative except as noted in the HPI and PMH.    Allergies  Fruit blend  Home Medications   Current Outpatient Rx  Name  Route  Sig  Dispense  Refill  . diphenhydrAMINE (BENADRYL) 25 mg capsule   Oral   Take 50 mg by mouth every 6 (six) hours as needed for itching.           BP 129/77  Pulse 76  Temp(Src) 99.1 F (37.3 C) (Oral)  Resp 17  SpO2 99%  LMP 04/06/2012  Physical Exam  Nursing note and vitals reviewed. Constitutional: She is oriented to person, place, and time. She appears well-developed and well-nourished. No distress.  HENT:  Head: Normocephalic and atraumatic.  Eyes: EOM are normal.  Neck: Neck supple. No tracheal deviation present.  Cardiovascular: Normal rate.   Pulses:      Popliteal pulses are 2+ on the right side.       Dorsalis pedis pulses are 2+ on the right side.       Posterior tibial pulses are 2+ on the right side.  Pulmonary/Chest: Effort normal. No respiratory distress.  Musculoskeletal: Normal range of motion.  She exhibits tenderness.  Tenderness over right patella. Patellar tendon intact. Negative anterior/posterior drawer test. Pain with anterior drawer test. Negative valgus and varus laxity. Limited ROM due to pain.   Neurological: She is alert and oriented to person, place, and time.  Skin: Skin is warm and dry.  Psychiatric: She has a normal mood and affect. Her behavior is normal.    ED Course  Procedures (including critical care time) Medications - No data to display  Dg Knee Complete 4 Views Right  05/04/2012  *RADIOLOGY REPORT*  Clinical Data: Fall out of bed.  anterior knee pain and swelling.  RIGHT KNEE - COMPLETE 4+ VIEW  Comparison:  None.  Findings:  There is no evidence of fracture, dislocation, or joint effusion.  There is no evidence of arthropathy or other focal bone abnormality.  Soft tissues are unremarkable.  IMPRESSION: Negative.    Original Report Authenticated By: Myles Rosenthal, M.D.       1. Knee contusion, right, initial encounter       MDM  PT with right knee injury after hitting it on bed frame. Joint is stable. X-ray negative. Pt unable to bear weight. Will apply immobilizer. Pain medications, crutches. Follow up with orthopedics if not improving.   I personally performed the services described in this documentation, which was scribed in my presence. The recorded information has been reviewed and is accurate.      Lottie Mussel, PA-C 05/04/12 [phone]

## 2012-05-03 NOTE — ED Notes (Signed)
Rushing to get ready for work and fell hitting  Rt knee  On the bed frame of the bed. Sharp pain in the knee radiating to the throughout the leg to the foot. States foot feel like it is falling asleep. Unable to apply pressure to the rt leg/ knee.

## 2012-05-04 MED ORDER — HYDROCODONE-ACETAMINOPHEN 5-325 MG PO TABS
1.0000 | ORAL_TABLET | Freq: Once | ORAL | Status: AC
  Administered 2012-05-04: 1 via ORAL
  Filled 2012-05-04: qty 1

## 2012-05-04 MED ORDER — NAPROXEN 500 MG PO TABS: 500.0000 mg | ORAL_TABLET | Freq: Two times a day (BID) | ORAL | Status: DC

## 2012-05-04 MED ORDER — IBUPROFEN 800 MG PO TABS
800.0000 mg | ORAL_TABLET | Freq: Once | ORAL | Status: AC
  Administered 2012-05-04: 800 mg via ORAL
  Filled 2012-05-04: qty 1

## 2012-05-04 MED ORDER — HYDROCODONE-ACETAMINOPHEN 5-325 MG PO TABS: 1.0000 | ORAL_TABLET | Freq: Four times a day (QID) | ORAL | Status: DC | PRN

## 2012-05-06 NOTE — ED Provider Notes (Signed)
Medical screening examination/treatment/procedure(s) were performed by non-physician practitioner and as supervising physician I was immediately available for consultation/collaboration.   Terris Germano, MD 05/06/12 1053 

## 2012-05-13 IMAGING — US US OB COMP LESS 14 WK
1 series · 14 of 14 positions shown · non-contrast
Comparison: 10/04/2010

CLINICAL DATA: Vaginal bleeding off and on for 1 week.  Heavy
bleeding with clots today.

OBSTETRIC <14 WK ULTRASOUND
TECHNIQUE: Transabdominal ultrasound was performed for evaluation
of the gestation as well as the maternal uterus and adnexal
regions.

[Series 1: us ob comp less 14 wks · 14 acquisitions, 14 frames shown]
[im 1/14]
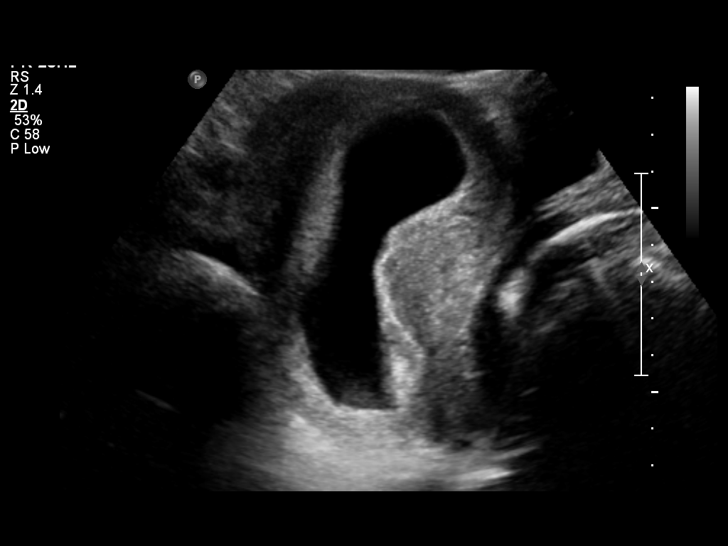
[im 2/14]
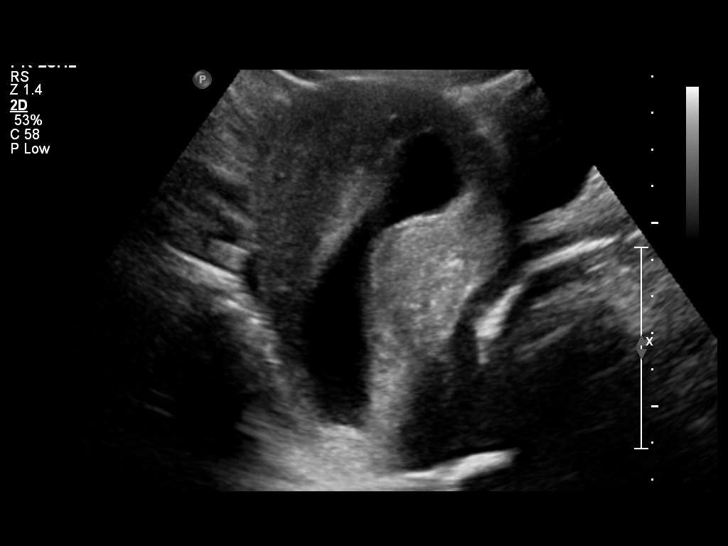
[im 3/14]
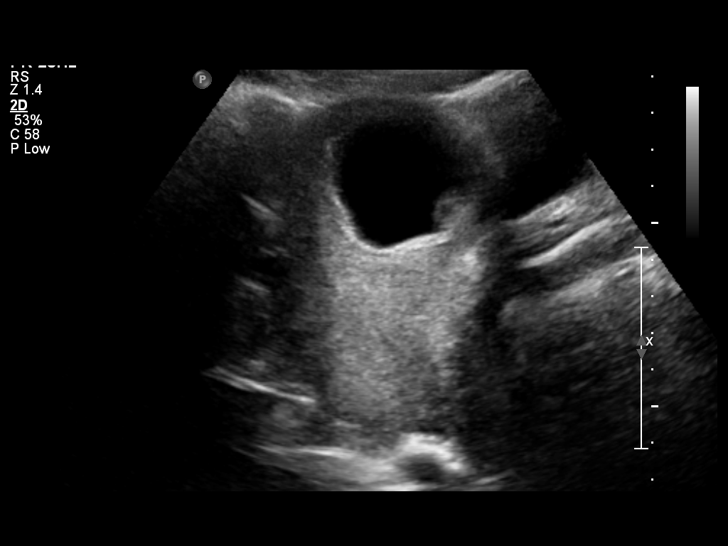
[im 4/14]
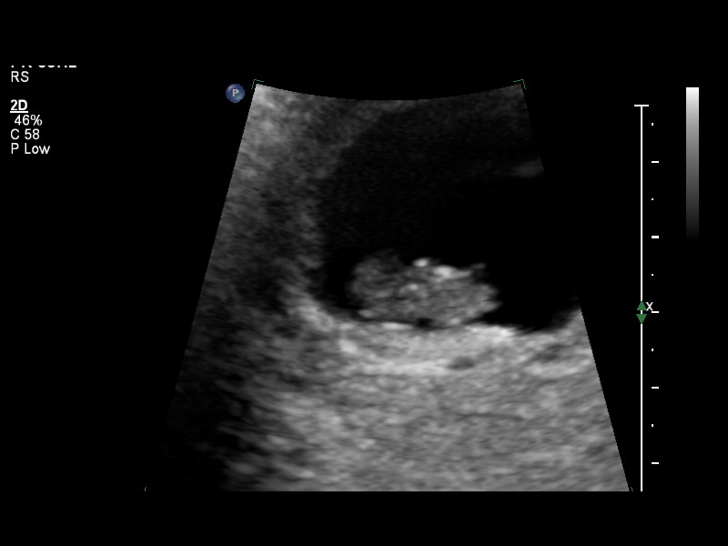
[im 5/14]
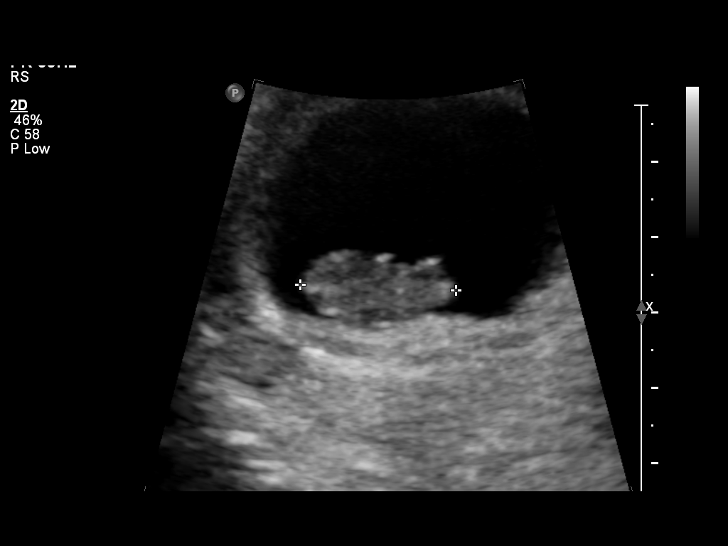
[im 6/14]
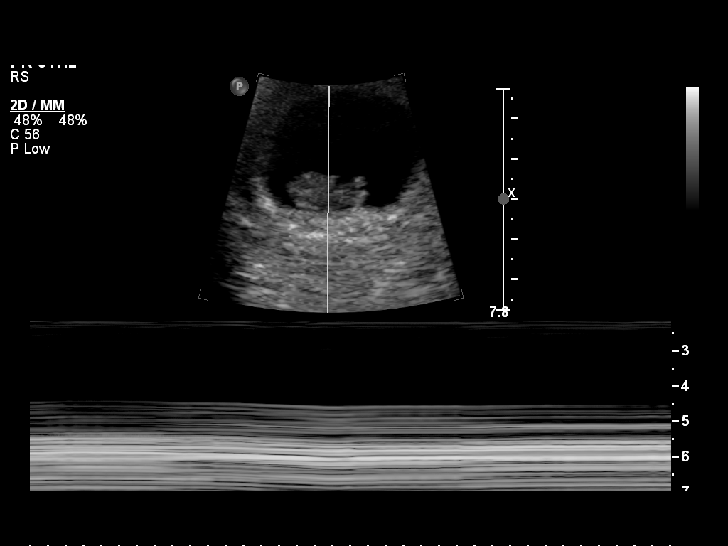
[im 7/14]
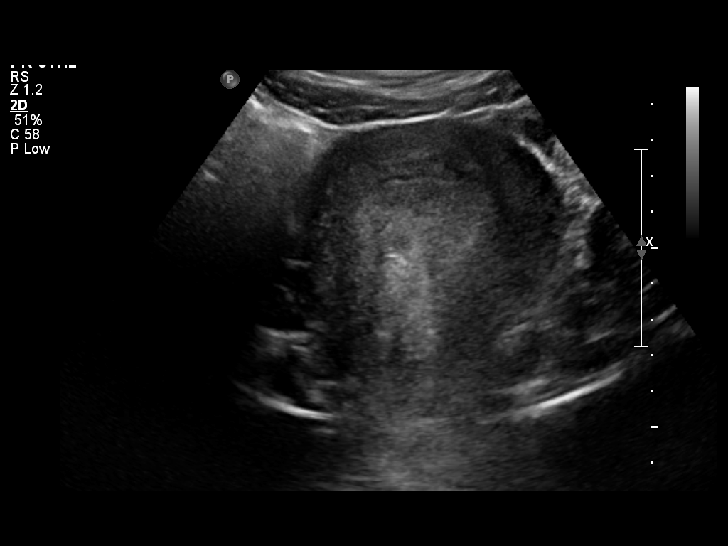
[im 8/14]
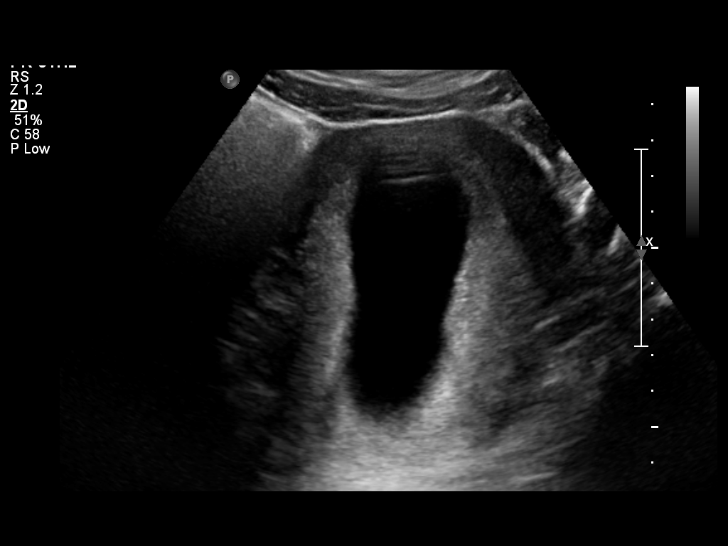
[im 9/14]
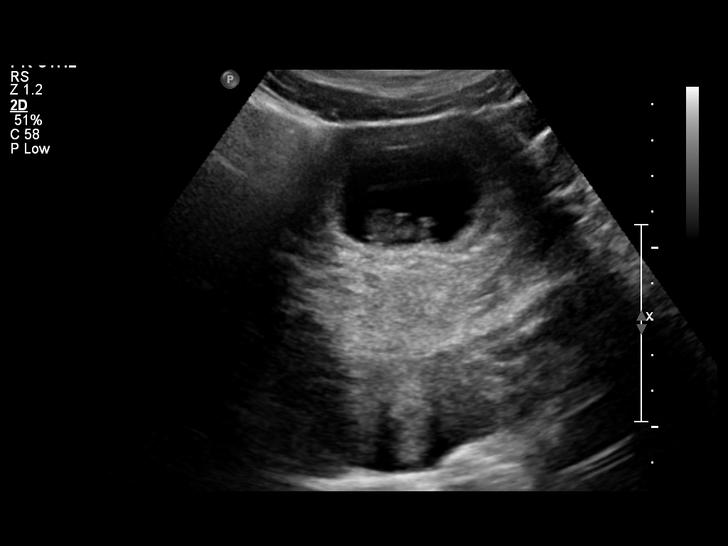
[im 10/14]
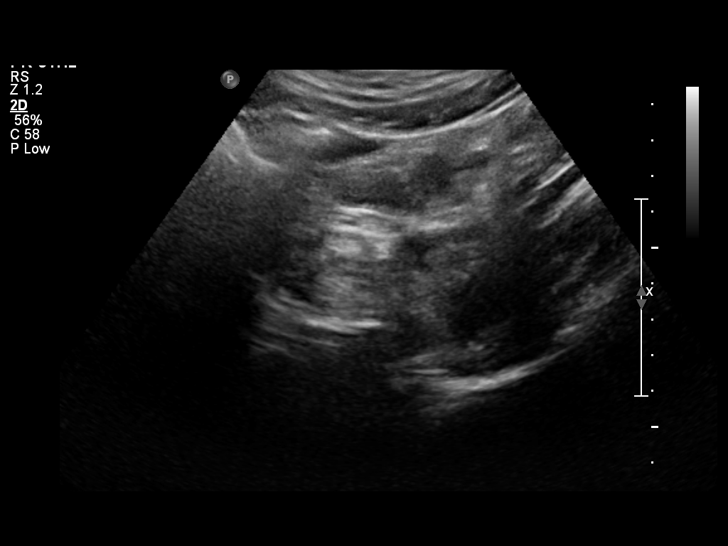
[im 11/14]
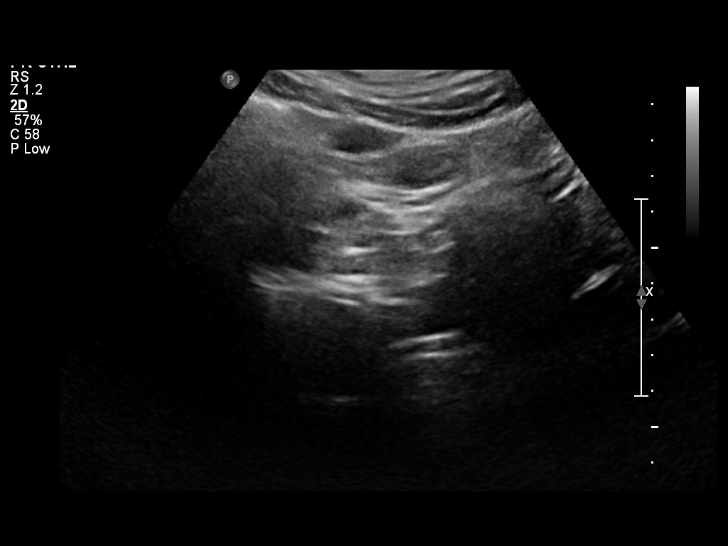
[im 12/14]
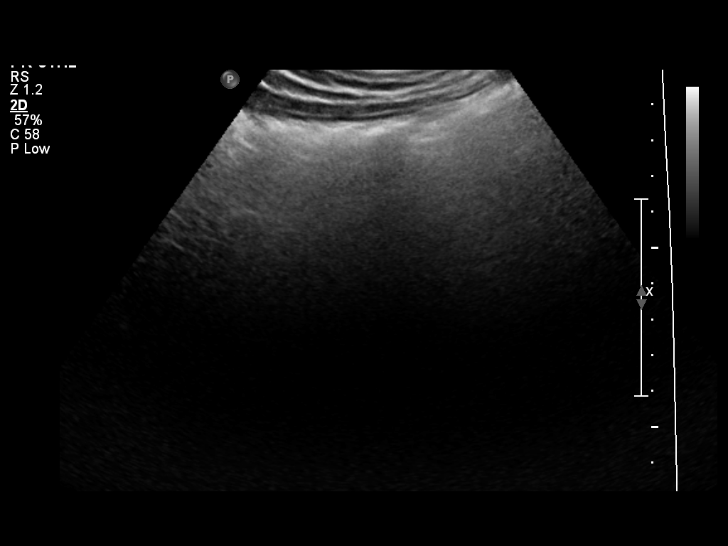
[im 13/14]
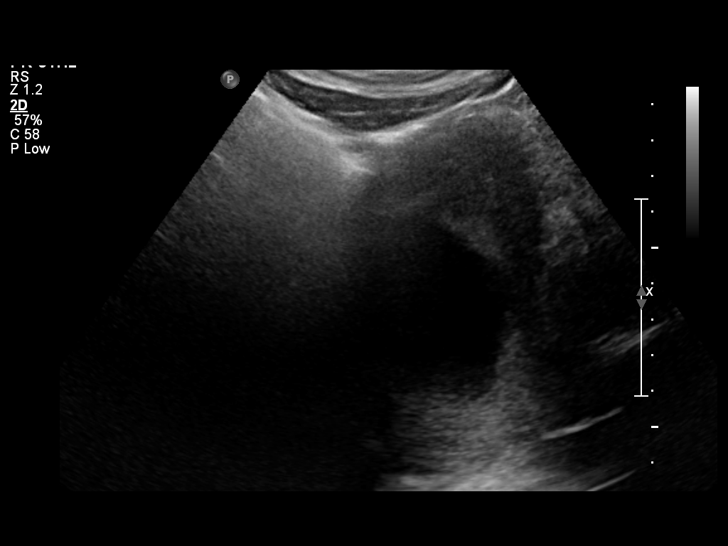
[im 14/14]
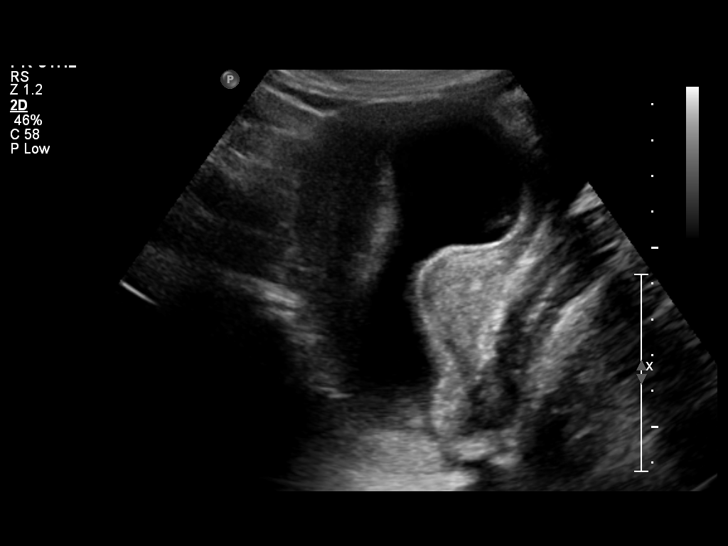

[14 of 14 positions shown; findings below may reference images not displayed]

Intrauterine gestational sac: Present, elongated.
Yolk sac: Not visualized
Embryo: Present
Cardiac Activity: Not present

CRL:  2.1 cm

Maternal uterus/Adnexae:
The ovaries are not visualized but have been previously evaluated.
IMPRESSION: Intrauterine fetal demise.

## 2012-08-14 ENCOUNTER — Emergency Department (HOSPITAL_COMMUNITY)
Admission: EM | Admit: 2012-08-14 | Discharge: 2012-08-14 | Disposition: A | Discharge status: Home / Self Care | Payer: 59 | Attending: Emergency Medicine | Admitting: Emergency Medicine

## 2012-08-14 ENCOUNTER — Encounter (HOSPITAL_COMMUNITY): Payer: Self-pay | Admitting: Emergency Medicine

## 2012-08-14 DIAGNOSIS — N390 Urinary tract infection, site not specified: Secondary | ICD-10-CM

## 2012-08-14 DIAGNOSIS — Z8742 Personal history of other diseases of the female genital tract: Secondary | ICD-10-CM | POA: Insufficient documentation

## 2012-08-14 DIAGNOSIS — N760 Acute vaginitis: Secondary | ICD-10-CM

## 2012-08-14 DIAGNOSIS — Z87891 Personal history of nicotine dependence: Secondary | ICD-10-CM | POA: Insufficient documentation

## 2012-08-14 DIAGNOSIS — B9689 Other specified bacterial agents as the cause of diseases classified elsewhere: Secondary | ICD-10-CM

## 2012-08-14 DIAGNOSIS — Z8619 Personal history of other infectious and parasitic diseases: Secondary | ICD-10-CM | POA: Insufficient documentation

## 2012-08-14 DIAGNOSIS — Z3202 Encounter for pregnancy test, result negative: Secondary | ICD-10-CM | POA: Insufficient documentation

## 2012-08-14 DIAGNOSIS — Z8669 Personal history of other diseases of the nervous system and sense organs: Secondary | ICD-10-CM | POA: Insufficient documentation

## 2012-08-14 LAB — WET PREP, GENITAL
Trich, Wet Prep: NONE SEEN
Yeast Wet Prep HPF POC: NONE SEEN

## 2012-08-14 LAB — URINALYSIS, ROUTINE W REFLEX MICROSCOPIC
Bilirubin Urine: NEGATIVE
Glucose, UA: NEGATIVE mg/dL
Ketones, ur: 15 mg/dL — AB
pH: 6 (ref 5.0–8.0)

## 2012-08-14 LAB — URINE MICROSCOPIC-ADD ON

## 2012-08-14 LAB — POCT PREGNANCY, URINE: Preg Test, Ur: NEGATIVE

## 2012-08-14 MED ORDER — METRONIDAZOLE 500 MG PO TABS: 500.0000 mg | ORAL_TABLET | Freq: Two times a day (BID) | ORAL | Status: DC

## 2012-08-14 MED ORDER — LIDOCAINE HCL (PF) 1 % IJ SOLN
INTRAMUSCULAR | Status: AC
  Filled 2012-08-14: qty 5

## 2012-08-14 MED ORDER — METRONIDAZOLE 500 MG PO TABS
500.0000 mg | ORAL_TABLET | Freq: Once | ORAL | Status: AC
  Administered 2012-08-14: 500 mg via ORAL
  Filled 2012-08-14: qty 1

## 2012-08-14 MED ORDER — CEFTRIAXONE SODIUM 1 G IJ SOLR
1.0000 g | Freq: Once | INTRAMUSCULAR | Status: AC
  Administered 2012-08-14: 1 g via INTRAMUSCULAR
  Filled 2012-08-14: qty 10

## 2012-08-14 MED ORDER — CEPHALEXIN 500 MG PO CAPS: 500.0000 mg | ORAL_CAPSULE | Freq: Two times a day (BID) | ORAL | Status: DC

## 2012-08-14 NOTE — ED Provider Notes (Signed)
History    CSN: [id number] Arrival date & time 08/14/12  0123  First MD Initiated Contact with Patient 08/14/12 0135     Chief Complaint  Patient presents with  . Vaginal Itching   (Consider location/radiation/quality/duration/timing/severity/associated sxs/prior Treatment) HPI  Darlene Livingston is a 22 y.o. female complaining of vaginal itching and burning, not exacerbated with urination, but constant x5 days. Pt has been taking OTC monistat suppositories with no relief. H/o BV and STI. Pt denies abdominal pain, fever, abnormal vaginal discharge.   Past Medical History  Diagnosis Date  . Headache(784.0)   . Miscarriage     x2, one at 13weeks, one at [redacted] weeks gestation  . Chlamydia    Past Surgical History  Procedure Laterality Date  . No past surgeries     Family History  Problem Relation Age of Onset  . Hypertension Mother   . Depression Maternal Aunt   . Diabetes Maternal Aunt   . Depression Maternal Uncle   . Diabetes Maternal Uncle   . Asthma Paternal Aunt   . Cancer Paternal Aunt   . Depression Paternal Aunt   . Diabetes Paternal Aunt   . Asthma Paternal Uncle   . Depression Paternal Uncle   . Diabetes Paternal Uncle   . Birth defects Cousin   . Anesthesia problems Neg Hx   . Hypotension Neg Hx   . Malignant hyperthermia Neg Hx   . Pseudochol deficiency Neg Hx    History  Substance Use Topics  . Smoking status: Former Games developer  . Smokeless tobacco: Never Used  . Alcohol Use: Yes     Comment: social   OB History   Grav Para Term Preterm Abortions TAB SAB Ect Mult Living   3 1 1  0 2 0 2 0 0 1     Review of Systems 10 systems reviewed and found to be negative, except as noted in the HPI  Allergies  Fruit blend  Home Medications   Current Outpatient Rx  Name  Route  Sig  Dispense  Refill  . diphenhydrAMINE (BENADRYL) 25 mg capsule   Oral   Take 50 mg by mouth every 6 (six) hours as needed for itching.         Marland Kitchen HYDROcodone-acetaminophen  (NORCO) 5-325 MG per tablet   Oral   Take 1 tablet by mouth every 6 (six) hours as needed for pain.   20 tablet   0   . naproxen (NAPROSYN) 500 MG tablet   Oral   Take 1 tablet (500 mg total) by mouth 2 (two) times daily.   30 tablet   0    BP 113/71  Pulse 79  Resp 16  SpO2 99% Physical Exam  Nursing note and vitals reviewed. Constitutional: She is oriented to person, place, and time. She appears well-developed and well-nourished. No distress.  HENT:  Head: Normocephalic.  Eyes: Conjunctivae and EOM are normal.  Cardiovascular: Normal rate.   Pulmonary/Chest: Effort normal and breath sounds normal. No stridor.  Abdominal: Soft. She exhibits no distension and no mass. There is no tenderness. There is no rebound and no guarding.  Genitourinary:  Pelvic exam chaperoned by Tech  No rashes or lesions, white/yellow homogenous discharge, no foul odor. No cervical motion or adnexal tenderess  Musculoskeletal: Normal range of motion.  Neurological: She is alert and oriented to person, place, and time.  Psychiatric: She has a normal mood and affect.    ED Course  Procedures (including critical care time)  Labs Reviewed  WET PREP, GENITAL - Abnormal; Notable for the following:    Clue Cells Wet Prep HPF POC MANY (*)    WBC, Wet Prep HPF POC MANY (*)    All other components within normal limits  URINALYSIS, ROUTINE W REFLEX MICROSCOPIC - Abnormal; Notable for the following:    APPearance TURBID (*)    Specific Gravity, Urine 1.043 (*)    Hgb urine dipstick MODERATE (*)    Bilirubin Urine SMALL (*)    Ketones, ur 15 (*)    Protein, ur 30 (*)    Leukocytes, UA MODERATE (*)    All other components within normal limits  URINE MICROSCOPIC-ADD ON - Abnormal; Notable for the following:    Squamous Epithelial / LPF MANY (*)    Bacteria, UA MANY (*)    Crystals CA OXALATE CRYSTALS (*)    All other components within normal limits  GC/CHLAMYDIA PROBE AMP  URINE CULTURE  URINALYSIS,  ROUTINE W REFLEX MICROSCOPIC  POCT PREGNANCY, URINE   No results found. 1. UTI (lower urinary tract infection)   2. Bacterial vaginosis     MDM   Filed Vitals:   08/14/12 0340  BP: 113/71  Pulse: 79  Resp: 16  SpO2: 99%     Darlene Livingston is a 22 y.o. female UA c/w UTI wet prep c/w BV. No signs of PID.   Medications  cefTRIAXone (ROCEPHIN) injection 1 g (1 g Intramuscular Given 08/14/12 0429)  metroNIDAZOLE (FLAGYL) tablet 500 mg (500 mg Oral Given 08/14/12 0416)    Pt is hemodynamically stable, appropriate for, and amenable to discharge at this time. Pt verbalized understanding and agrees with care plan. All questions answered. Outpatient follow-up and specific return precautions discussed.    Discharge Medication List as of 08/14/2012  4:07 AM    START taking these medications   Details  cephALEXin (KEFLEX) 500 MG capsule Take 1 capsule (500 mg total) by mouth 2 (two) times daily., Starting 08/14/2012, Until Discontinued, Print    metroNIDAZOLE (FLAGYL) 500 MG tablet Take 1 tablet (500 mg total) by mouth 2 (two) times daily. One po bid x 7 days, Starting 08/14/2012, Until Discontinued, Print        Note: Portions of this report may have been transcribed using voice recognition software. Every effort was made to ensure accuracy; however, inadvertent computerized transcription errors may be present    Darlene Emery, PA-C 08/16/12 [phone]

## 2012-08-14 NOTE — ED Notes (Signed)
PT. REPORTS VAGINAL IRRITATION ": BURNING" / ITCHING UNRELIEVED BY OTC MONISTAT CREAM FOR SEVERAL DAYS .

## 2012-08-14 NOTE — ED Notes (Signed)
Pelvic cart at bedside. 

## 2012-08-15 LAB — URINE CULTURE

## 2012-08-15 LAB — GC/CHLAMYDIA PROBE AMP
CT Probe RNA: NEGATIVE
GC Probe RNA: NEGATIVE

## 2012-08-16 NOTE — ED Provider Notes (Signed)
Medical screening examination/treatment/procedure(s) were performed by non-physician practitioner and as supervising physician I was immediately available for consultation/collaboration.   Hanley Seamen, MD 08/16/12 2239

## 2012-09-11 IMAGING — US US OB TRANSVAGINAL
1 series · 13 of 28 positions shown · non-contrast
Comparison: Pelvic ultrasound performed 11/20/2010

CLINICAL DATA: Pelvic cramping.

OBSTETRIC <14 WK US AND TRANSVAGINAL OB US
TECHNIQUE: Both transabdominal and transvaginal ultrasound
examinations were performed for complete evaluation of the
gestation as well as the maternal uterus, adnexal regions, and
pelvic cul-de-sac.  Transvaginal technique was performed to assess
early pregnancy.

[Series 1: us ob comp less 14 wks · 13 of 57 slices shown]
[im 3/57]
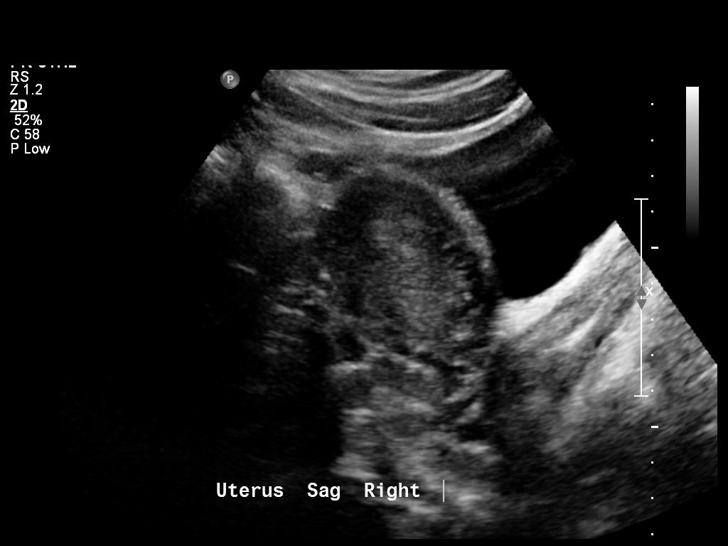
[im 7/57]
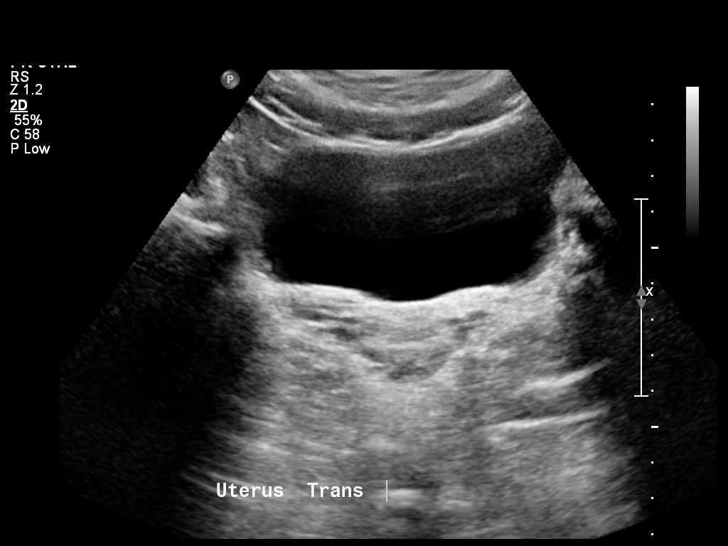
[im 11/57]
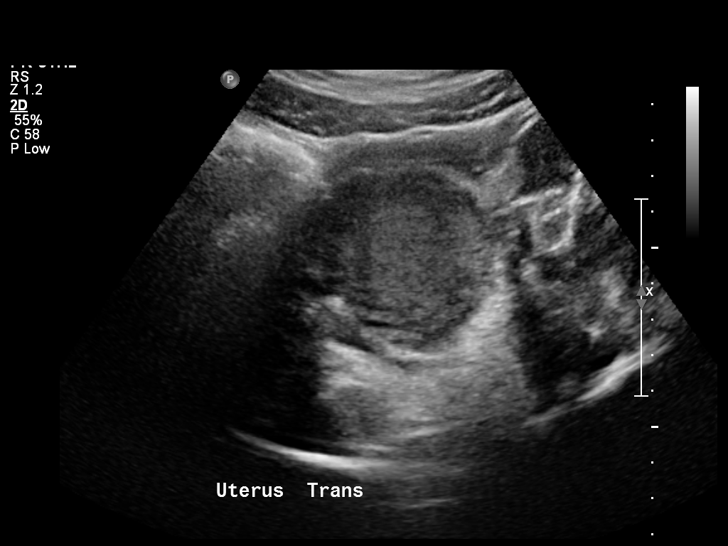
[im 15/57]
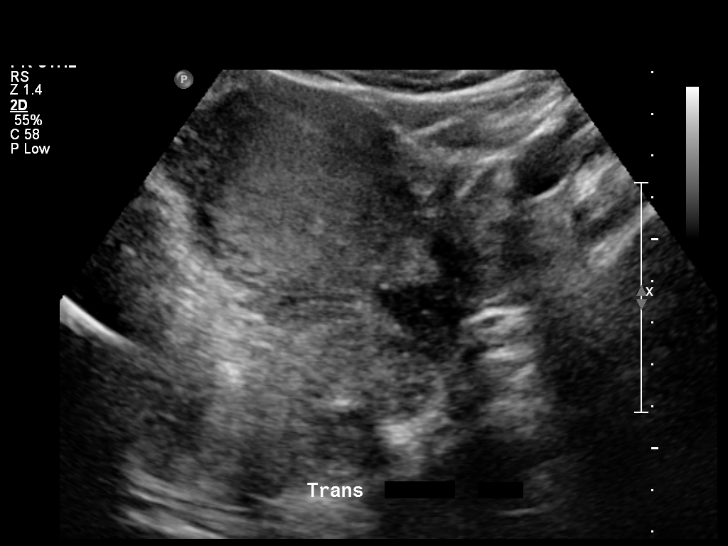
[im 19/57]
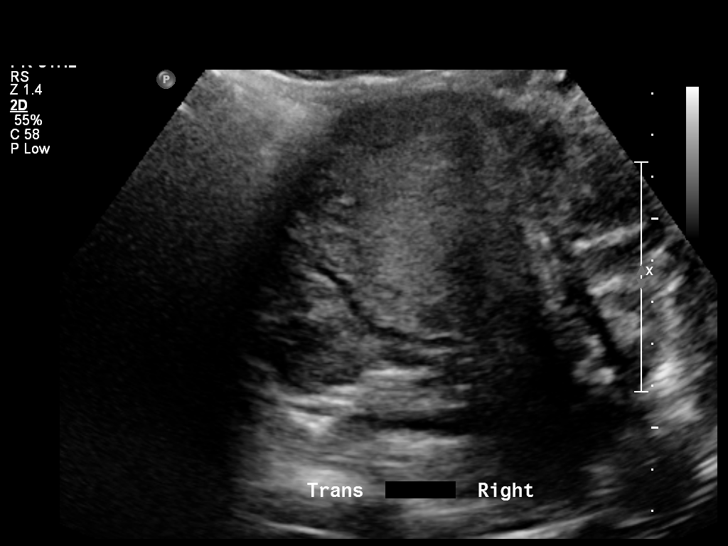
[im 23/57]
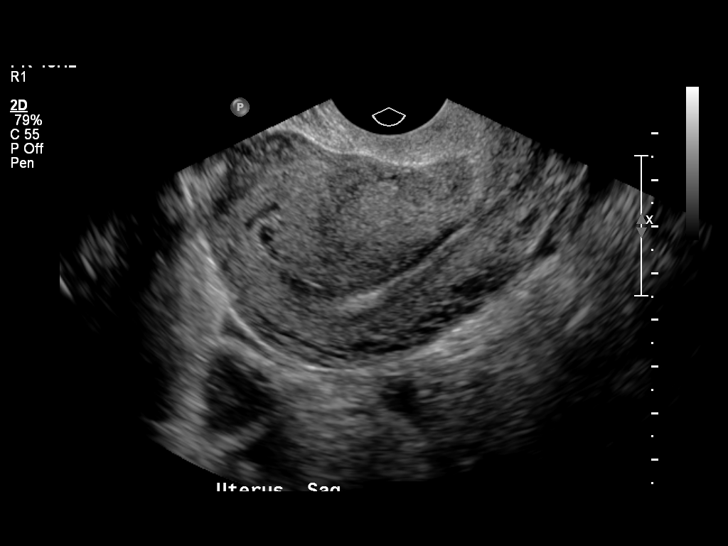
[im 30/57]
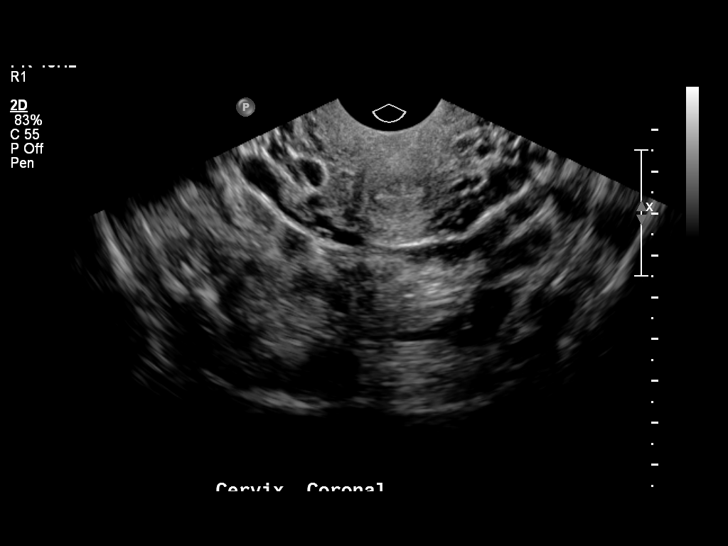
[im 34/57]
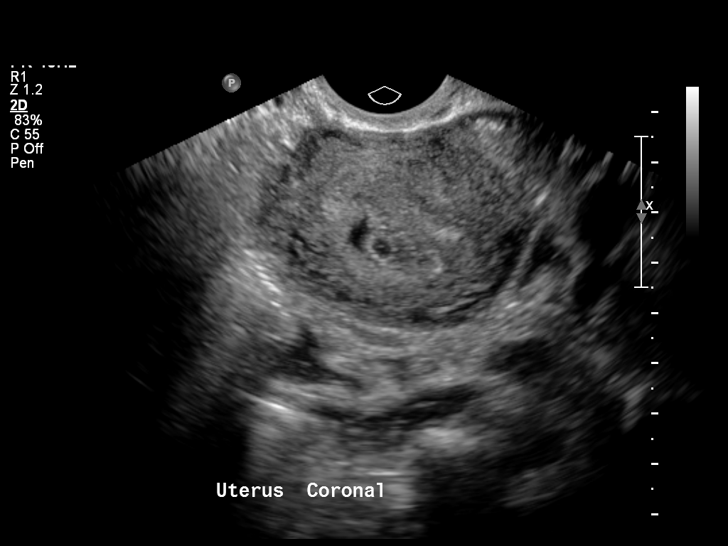
[im 38/57]
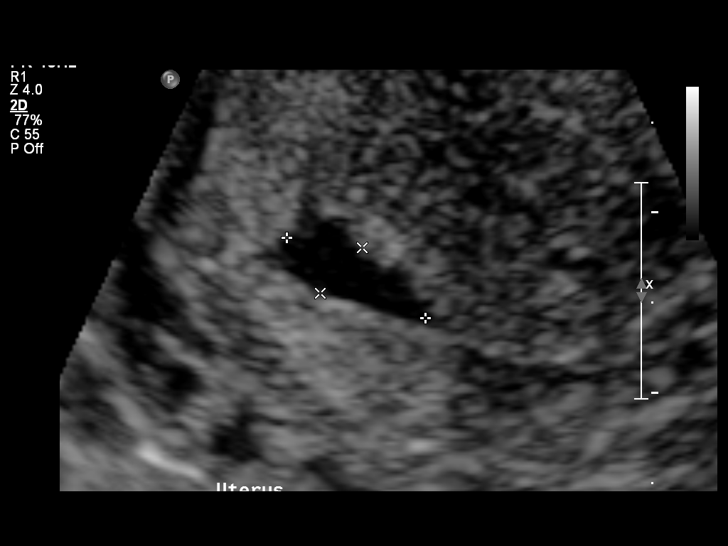
[im 42/57]
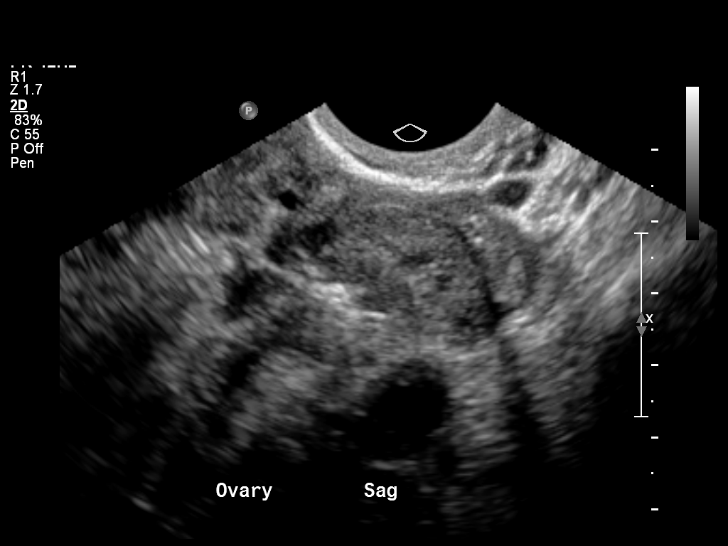
[im 46/57]
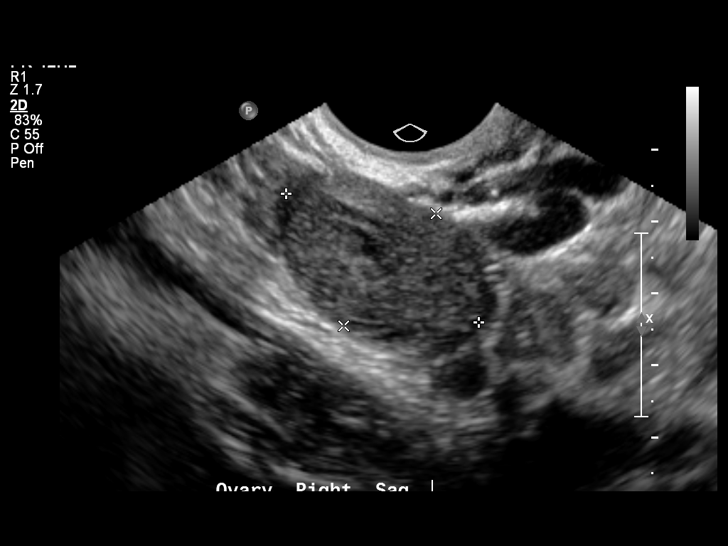
[im 50/57]
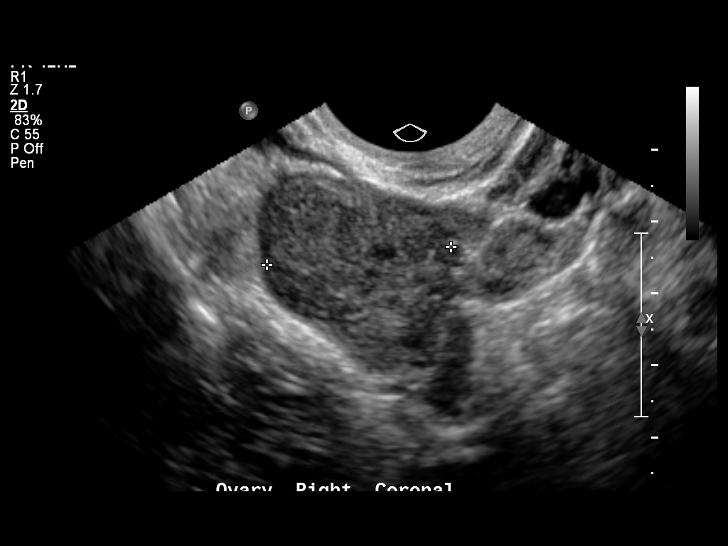
[im 54/57]
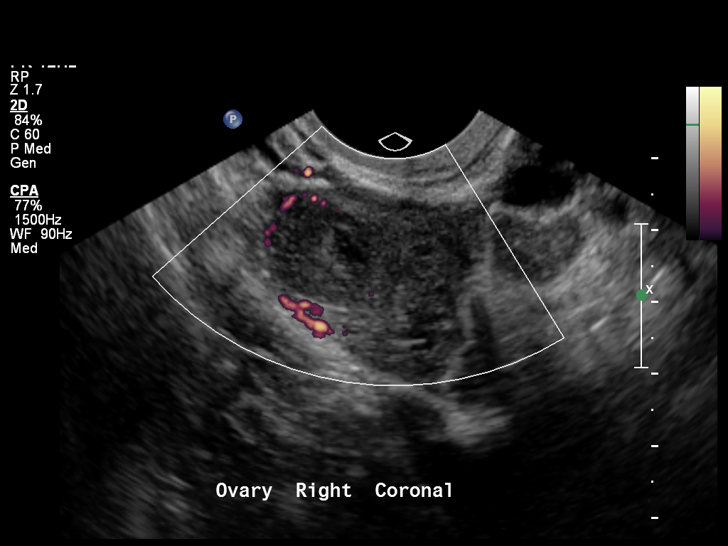

[13 of 28 positions shown; findings below may reference images not displayed]

Intrauterine gestational sac:  Visualized/normal in shape.
Yolk sac: Yes
Embryo: No
Cardiac Activity: N/A

MSD: 3.9  mm  4    w 6    d         US EDC: 11/22/2011

Maternal uterus/adnexae:
A small amount of subchorionic hemorrhage is noted.  The uterus is
otherwise unremarkable in appearance.

The ovaries are within normal limits.  The right ovary measures
x 2.0 x 2.6 cm, while the left ovary measures 2.3 x 1.7 x 1.4 cm.
No suspicious adnexal masses are seen; there is no evidence of
ovarian torsion.

No free fluid is seen within the pelvic cul-de-sac.
IMPRESSION: 1.  Single intrauterine gestational sac noted, with a mean sac
diameter of 3.9 mm.  The embryo is not yet seen.  This corresponds
to a gestational age of 4 weeks 6 days, which matches the
gestational age of 5 weeks 3 days by LMP, reflecting the estimated
date of delivery November 18, 2011.
2.  Small amount of subchorionic hemorrhage noted.

## 2013-08-12 ENCOUNTER — Inpatient Hospital Stay (HOSPITAL_COMMUNITY)
Admission: AD | Admit: 2013-08-12 | Discharge: 2013-08-13 | Disposition: A | Discharge status: Home / Self Care | Payer: 59 | Source: Ambulatory Visit | Attending: Obstetrics & Gynecology | Admitting: Obstetrics & Gynecology

## 2013-08-12 ENCOUNTER — Encounter (HOSPITAL_COMMUNITY): Payer: Self-pay | Admitting: *Deleted

## 2013-08-12 DIAGNOSIS — N898 Other specified noninflammatory disorders of vagina: Secondary | ICD-10-CM | POA: Insufficient documentation

## 2013-08-12 DIAGNOSIS — N9489 Other specified conditions associated with female genital organs and menstrual cycle: Secondary | ICD-10-CM

## 2013-08-12 DIAGNOSIS — R110 Nausea: Secondary | ICD-10-CM

## 2013-08-12 DIAGNOSIS — R51 Headache: Secondary | ICD-10-CM | POA: Insufficient documentation

## 2013-08-12 DIAGNOSIS — F17200 Nicotine dependence, unspecified, uncomplicated: Secondary | ICD-10-CM | POA: Insufficient documentation

## 2013-08-12 DIAGNOSIS — R109 Unspecified abdominal pain: Secondary | ICD-10-CM | POA: Insufficient documentation

## 2013-08-12 LAB — URINALYSIS, ROUTINE W REFLEX MICROSCOPIC
Bilirubin Urine: NEGATIVE
GLUCOSE, UA: NEGATIVE mg/dL
HGB URINE DIPSTICK: NEGATIVE
KETONES UR: 40 mg/dL — AB
LEUKOCYTES UA: NEGATIVE
Nitrite: NEGATIVE
PH: 6 (ref 5.0–8.0)
PROTEIN: NEGATIVE mg/dL
Specific Gravity, Urine: 1.025 (ref 1.005–1.030)
Urobilinogen, UA: 1 mg/dL (ref 0.0–1.0)

## 2013-08-12 LAB — POCT PREGNANCY, URINE: PREG TEST UR: NEGATIVE

## 2013-08-12 NOTE — MAU Note (Signed)
Pt reports lower abd pain for 3 days, worsening today. Vaginal irritation. Nausea for the last 3 days also. LMP 08/03/2013

## 2013-08-13 DIAGNOSIS — N898 Other specified noninflammatory disorders of vagina: Secondary | ICD-10-CM

## 2013-08-13 LAB — WET PREP, GENITAL
CLUE CELLS WET PREP: NONE SEEN
TRICH WET PREP: NONE SEEN
Yeast Wet Prep HPF POC: NONE SEEN

## 2013-08-13 MED ORDER — ONDANSETRON HCL 4 MG PO TABS: 4.0000 mg | ORAL_TABLET | Freq: Three times a day (TID) | ORAL | Status: DC | PRN

## 2013-08-13 MED ORDER — ONDANSETRON 8 MG PO TBDP
8.0000 mg | ORAL_TABLET | ORAL | Status: AC
  Administered 2013-08-13: 8 mg via ORAL
  Filled 2013-08-13: qty 1

## 2013-08-13 NOTE — Discharge Instructions (Signed)
Pelvic Pain Female pelvic pain can be caused by many different things and start from a variety of places. Pelvic pain refers to pain that is located in the lower half of the abdomen and between your hips. The pain may occur over a short period of time (acute) or may be reoccurring (chronic). The cause of pelvic pain may be related to disorders affecting the female reproductive organs (gynecologic), but it may also be related to the bladder, kidney stones, an intestinal complication, or muscle or skeletal problems. Getting help right away for pelvic pain is important, especially if there has been severe, sharp, or a sudden onset of unusual pain. It is also important to get help right away because some types of pelvic pain can be life threatening.  CAUSES  Below are only some of the causes of pelvic pain. The causes of pelvic pain can be in one of several categories.   Gynecologic.  Pelvic inflammatory disease.  Sexually transmitted infection.  Ovarian cyst or a twisted ovarian ligament (ovarian torsion).  Uterine lining that grows outside the uterus (endometriosis).  Fibroids, cysts, or tumors.  Ovulation.  Pregnancy.  Pregnancy that occurs outside the uterus (ectopic pregnancy).  Miscarriage.  Labor.  Abruption of the placenta or ruptured uterus.  Infection.  Uterine infection (endometritis).  Bladder infection.  Diverticulitis.  Miscarriage related to a uterine infection (septic abortion).  Bladder.  Inflammation of the bladder (cystitis).  Kidney stone(s).  Gastrointestinal.  Constipation.  Diverticulitis.  Neurologic.  Trauma.  Feeling pelvic pain because of mental or emotional causes (psychosomatic).  Cancers of the bowel or pelvis. EVALUATION  Your caregiver will want to take a careful history of your concerns. This includes recent changes in your health, a careful gynecologic history of your periods (menses), and a sexual history. Obtaining your family  history and medical history is also important. Your caregiver may suggest a pelvic exam. A pelvic exam will help identify the location and severity of the pain. It also helps in the evaluation of which organ system may be involved. In order to identify the cause of the pelvic pain and be properly treated, your caregiver may order tests. These tests may include:   A pregnancy test.  Pelvic ultrasonography.  An X-ray exam of the abdomen.  A urinalysis or evaluation of vaginal discharge.  Blood tests. HOME CARE INSTRUCTIONS   Only take over-the-counter or prescription medicines for pain, discomfort, or fever as directed by your caregiver.   Rest as directed by your caregiver.   Eat a balanced diet.   Drink enough fluids to make your urine clear or pale yellow, or as directed.   Avoid sexual intercourse if it causes pain.   Apply warm or cold compresses to the lower abdomen depending on which one helps the pain.   Avoid stressful situations.   Keep a journal of your pelvic pain. Write down when it started, where the pain is located, and if there are things that seem to be associated with the pain, such as food or your menstrual cycle.  Follow up with your caregiver as directed.  SEEK MEDICAL CARE IF:  Your medicine does not help your pain.  You have abnormal vaginal discharge. SEEK IMMEDIATE MEDICAL CARE IF:   You have heavy bleeding from the vagina.   Your pelvic pain increases.   You feel light-headed or faint.   You have chills.   You have pain with urination or blood in your urine.   You have uncontrolled diarrhea   or vomiting.   You have a fever or persistent symptoms for more than 3 days.  You have a fever and your symptoms suddenly get worse.   You are being physically or sexually abused.  MAKE SURE YOU:  Understand these instructions.  Will watch your condition.  Will get help if you are not doing well or get worse. Document Released:  12/06/2003 Document Revised: 05/25/2013 Document Reviewed: 04/30/2011 [facility] Patient Information 2015 [place], [place]. This information is not intended to replace advice given to you by your health care provider. Make sure you discuss any questions you have with your health care provider. Genital Herpes Genital herpes is a sexually transmitted disease. This means that it is a disease passed by having sex with an infected person. There is no cure for genital herpes. The time between attacks can be months to years. The virus may live in a person but produce no problems (symptoms). This infection can be passed to a baby as it travels down the birth canal (vagina). In a newborn, this can cause central nervous system damage, eye damage, or even death. The virus that causes genital herpes is usually HSV-2 virus. The virus that causes oral herpes is usually HSV-1. The diagnosis (learning what is wrong) is made through culture results. SYMPTOMS  Usually symptoms of pain and itching begin a few days to a week after contact. It first appears as small blisters that progress to small painful ulcers which then scab over and heal after several days. It affects the outer genitalia, birth canal, cervix, penis, anal area, buttocks, and thighs. HOME CARE INSTRUCTIONS   Keep ulcerated areas dry and clean.  Take medications as directed. Antiviral medications can speed up healing. They will not prevent recurrences or cure this infection. These medications can also be taken for suppression if there are frequent recurrences.  While the infection is active, it is contagious. Avoid all sexual contact during active infections.  Condoms may help prevent spread of the herpes virus.  Practice safe sex.  Wash your hands thoroughly after touching the genital area.  Avoid touching your eyes after touching your genital area.  Inform your caregiver if you have had genital herpes and become pregnant. It is your responsibility  to insure a safe outcome for your baby in this pregnancy.  Only take over-the-counter or prescription medicines for pain, discomfort, or fever as directed by your caregiver. SEEK MEDICAL CARE IF:   You have a recurrence of this infection.  You do not respond to medications and are not improving.  You have new sources of pain or discharge which have changed from the original infection.  You have an oral temperature above 102 F (38.9 C).  You develop abdominal pain.  You develop eye pain or signs of eye infection. Document Released: 01/06/2000 Document Revised: 04/02/2011 Document Reviewed: 01/26/2009 [facility] Patient Information 2015 [place], [place]. This information is not intended to replace advice given to you by your health care provider. Make sure you discuss any questions you have with your health care provider.

## 2013-08-13 NOTE — MAU Provider Note (Signed)
Chief Complaint: Nausea and Abdominal Pain   First Provider Initiated Contact with Patient 08/12/13 2346     SUBJECTIVE HPI: Darlene Livingston is a 23 y.o. [id number] who presents to maternity admissions reporting abdominal cramping, nausea, and vaginal irritation x3 days.  Patient's last menstrual period was 08/03/2013. Her period was normal for her, lasting 5 days.  She denies known exposure to STDs.  She denies vaginal bleeding, vaginal itching/burning, urinary symptoms, h/a, dizziness, vomiting, or fever/chills.     Past Medical History  Diagnosis Date  . Headache(784.0)   . Miscarriage     x2, one at 13weeks, one at [redacted] weeks gestation  . Chlamydia    Past Surgical History  Procedure Laterality Date  . No past surgeries     History   Social History  . Marital Status: Single    Spouse Name: N/A    Number of Children: N/A  . Years of Education: N/A   Occupational History  . Not on file.   Social History Main Topics  . Smoking status: Current Some Day Smoker  . Smokeless tobacco: Never Used  . Alcohol Use: No     Comment: social  . Drug Use: No  . Sexual Activity: Yes    Birth Control/ Protection: None   Other Topics Concern  . Not on file   Social History Narrative  . No narrative on file   No current facility-administered medications on file prior to encounter.   Current Outpatient Prescriptions on File Prior to Encounter  Medication Sig Dispense Refill  . cephALEXin (KEFLEX) 500 MG capsule Take 1 capsule (500 mg total) by mouth 2 (two) times daily.  20 capsule  0  . metroNIDAZOLE (FLAGYL) 500 MG tablet Take 1 tablet (500 mg total) by mouth 2 (two) times daily. One po bid x 7 days  14 tablet  0   Allergies  Allergen Reactions  . Fruit Blend Other (See Comments)    Sore throat from eating acidic fresh fruits    ROS: Pertinent items in HPI  OBJECTIVE Blood pressure 123/69, pulse 73, temperature 99.3 F (37.4 C), temperature source Oral, resp. rate 16, height  5\' 6"  (1.676 m), weight 78.472 kg (173 lb), last menstrual period 08/03/2013, SpO2 99.00%. GENERAL: Well-developed, well-nourished female in no acute distress.  HEENT: Normocephalic HEART: normal rate RESP: normal effort ABDOMEN: Soft, non-tender EXTREMITIES: Nontender, no edema NEURO: Alert and oriented Pelvic exam: Cervix pink, visually closed, with "strawberry" appearance/small red patches throughout, scant white creamy discharge, vaginal walls and external genitalia normal except one small flat lesion on right labia which is open and painful to touch Bimanual exam: Cervix 0/long/high, firm, anterior, positive CMT, uterus nontender, nonenlarged, adnexa without tenderness, enlargement, or mass  LAB RESULTS Results for orders placed during the hospital encounter of 08/12/13 (from the past 24 hour(s))  URINALYSIS, ROUTINE W REFLEX MICROSCOPIC     Status: Abnormal   Collection Time    08/12/13 11:05 PM      Result Value Ref Range   Color, Urine YELLOW  YELLOW   APPearance CLEAR  CLEAR   Specific Gravity, Urine 1.025  1.005 - 1.030   pH 6.0  5.0 - 8.0   Glucose, UA NEGATIVE  NEGATIVE mg/dL   Hgb urine dipstick NEGATIVE  NEGATIVE   Bilirubin Urine NEGATIVE  NEGATIVE   Ketones, ur 40 (*) NEGATIVE mg/dL   Protein, ur NEGATIVE  NEGATIVE mg/dL   Urobilinogen, UA 1.0  0.0 - 1.0 mg/dL   Nitrite  NEGATIVE  NEGATIVE   Leukocytes, UA NEGATIVE  NEGATIVE  POCT PREGNANCY, URINE     Status: None   Collection Time    08/12/13 11:12 PM      Result Value Ref Range   Preg Test, Ur NEGATIVE  NEGATIVE  WET PREP, GENITAL     Status: Abnormal   Collection Time    08/13/13 12:12 AM      Result Value Ref Range   Yeast Wet Prep HPF POC NONE SEEN  NONE SEEN   Trich, Wet Prep NONE SEEN  NONE SEEN   Clue Cells Wet Prep HPF POC NONE SEEN  NONE SEEN   WBC, Wet Prep HPF POC FEW (*) NONE SEEN     ASSESSMENT 1. Vaginal lesion   2. Uterine cramping   3. Nausea     PLAN Zofran 8 mg ODT in  MAU Discharge home Zofran 4 mg PO Q 8 hours PRN GCC pending Take home pregnancy test in 1 week if symptoms persist F/U with [facility] as needed Return to MAU as needed for emergencies    Medication List    STOP taking these medications       cephALEXin 500 MG capsule  Commonly known as:  KEFLEX     metroNIDAZOLE 500 MG tablet  Commonly known as:  FLAGYL       Follow-up Information   Follow up with [facility]. (As needed)    Specialty:  Obstetrics and Gynecology   Contact information:   [place] [address] [place] Certified Nurse-Midwife 08/13/2013  12:50 AM

## 2013-08-14 LAB — GC/CHLAMYDIA PROBE AMP
CT Probe RNA: NEGATIVE
GC PROBE AMP APTIMA: NEGATIVE

## 2013-08-14 LAB — HERPES SIMPLEX VIRUS CULTURE: CULTURE: NOT DETECTED

## 2013-10-11 ENCOUNTER — Encounter (HOSPITAL_COMMUNITY): Payer: Self-pay

## 2013-10-11 ENCOUNTER — Inpatient Hospital Stay (HOSPITAL_COMMUNITY)
Admission: AD | Admit: 2013-10-11 | Discharge: 2013-10-11 | Disposition: A | Discharge status: Home / Self Care | Payer: 59 | Source: Ambulatory Visit | Attending: Obstetrics and Gynecology | Admitting: Obstetrics and Gynecology

## 2013-10-11 DIAGNOSIS — N898 Other specified noninflammatory disorders of vagina: Secondary | ICD-10-CM

## 2013-10-11 DIAGNOSIS — N949 Unspecified condition associated with female genital organs and menstrual cycle: Secondary | ICD-10-CM | POA: Diagnosis present

## 2013-10-11 DIAGNOSIS — F17200 Nicotine dependence, unspecified, uncomplicated: Secondary | ICD-10-CM | POA: Insufficient documentation

## 2013-10-11 DIAGNOSIS — N9089 Other specified noninflammatory disorders of vulva and perineum: Secondary | ICD-10-CM

## 2013-10-11 DIAGNOSIS — N899 Noninflammatory disorder of vagina, unspecified: Secondary | ICD-10-CM | POA: Diagnosis not present

## 2013-10-11 DIAGNOSIS — A6000 Herpesviral infection of urogenital system, unspecified: Secondary | ICD-10-CM

## 2013-10-11 HISTORY — DX: Herpesviral infection of urogenital system, unspecified: A60.00

## 2013-10-11 LAB — CBC
HEMATOCRIT: 34.8 % — AB (ref 36.0–46.0)
HEMOGLOBIN: 11.7 g/dL — AB (ref 12.0–15.0)
MCH: 30.7 pg (ref 26.0–34.0)
MCHC: 33.6 g/dL (ref 30.0–36.0)
MCV: 91.3 fL (ref 78.0–100.0)
Platelets: 243 10*3/uL (ref 150–400)
RBC: 3.81 MIL/uL — ABNORMAL LOW (ref 3.87–5.11)
RDW: 13.4 % (ref 11.5–15.5)
WBC: 5.1 10*3/uL (ref 4.0–10.5)

## 2013-10-11 LAB — WET PREP, GENITAL
Clue Cells Wet Prep HPF POC: NONE SEEN
TRICH WET PREP: NONE SEEN
YEAST WET PREP: NONE SEEN

## 2013-10-11 LAB — URINE MICROSCOPIC-ADD ON

## 2013-10-11 LAB — URINALYSIS, ROUTINE W REFLEX MICROSCOPIC
Bilirubin Urine: NEGATIVE
GLUCOSE, UA: NEGATIVE mg/dL
Ketones, ur: NEGATIVE mg/dL
NITRITE: NEGATIVE
PH: 6 (ref 5.0–8.0)
Protein, ur: NEGATIVE mg/dL
SPECIFIC GRAVITY, URINE: 1.02 (ref 1.005–1.030)
Urobilinogen, UA: 2 mg/dL — ABNORMAL HIGH (ref 0.0–1.0)

## 2013-10-11 LAB — POCT PREGNANCY, URINE: PREG TEST UR: NEGATIVE

## 2013-10-11 MED ORDER — FLUCONAZOLE 150 MG PO TABS: 150.0000 mg | ORAL_TABLET | Freq: Once | ORAL | Status: DC

## 2013-10-11 MED ORDER — LIDOCAINE 5 % EX OINT
TOPICAL_OINTMENT | Freq: Once | CUTANEOUS | Status: AC
  Administered 2013-10-11: 22:00:00 via TOPICAL
  Filled 2013-10-11: qty 35.44

## 2013-10-11 MED ORDER — VALACYCLOVIR HCL 1 G PO TABS: 1000.0000 mg | ORAL_TABLET | Freq: Two times a day (BID) | ORAL | Status: DC

## 2013-10-11 NOTE — Discharge Instructions (Signed)
Genital Herpes (we are not sure that you have this)  Genital herpes is a sexually transmitted disease. This means that it is a disease passed by having sex with an infected person. There is no cure for genital herpes. The time between attacks can be months to years. The virus may live in a person but produce no problems (symptoms). This infection can be passed to a baby as it travels down the birth canal (vagina). In a newborn, this can cause central nervous system damage, eye damage, or even death. The virus that causes genital herpes is usually HSV-2 virus. The virus that causes oral herpes is usually HSV-1. The diagnosis (learning what is wrong) is made through culture results. SYMPTOMS  Usually symptoms of pain and itching begin a few days to a week after contact. It first appears as small blisters that progress to small painful ulcers which then scab over and heal after several days. It affects the outer genitalia, birth canal, cervix, penis, anal area, buttocks, and thighs. HOME CARE INSTRUCTIONS   Keep ulcerated areas dry and clean.  Take medications as directed. Antiviral medications can speed up healing. They will not prevent recurrences or cure this infection. These medications can also be taken for suppression if there are frequent recurrences.  While the infection is active, it is contagious. Avoid all sexual contact during active infections.  Condoms may help prevent spread of the herpes virus.  Practice safe sex.  Wash your hands thoroughly after touching the genital area.  Avoid touching your eyes after touching your genital area.  Inform your caregiver if you have had genital herpes and become pregnant. It is your responsibility to insure a safe outcome for your baby in this pregnancy.  Only take over-the-counter or prescription medicines for pain, discomfort, or fever as directed by your caregiver. SEEK MEDICAL CARE IF:   You have a recurrence of this infection.  You do  not respond to medications and are not improving.  You have new sources of pain or discharge which have changed from the original infection.  You have an oral temperature above 102 F (38.9 C).  You develop abdominal pain.  You develop eye pain or signs of eye infection. Document Released: 01/06/2000 Document Revised: 04/02/2011 Document Reviewed: 01/26/2009 [facility] Patient Information 2015 [place], [place]. This information is not intended to replace advice given to you by your health care provider. Make sure you discuss any questions you have with your health care provider. Monilial Vaginitis Vaginitis in a soreness, swelling and redness (inflammation) of the vagina and vulva. Monilial vaginitis is not a sexually transmitted infection. CAUSES  Yeast vaginitis is caused by yeast (candida) that is normally found in your vagina. With a yeast infection, the candida has overgrown in number to a point that upsets the chemical balance. SYMPTOMS   White, thick vaginal discharge.  Swelling, itching, redness and irritation of the vagina and possibly the lips of the vagina (vulva).  Burning or painful urination.  Painful intercourse. DIAGNOSIS  Things that may contribute to monilial vaginitis are:  Postmenopausal and virginal states.  Pregnancy.  Infections.  Being tired, sick or stressed, especially if you had monilial vaginitis in the past.  Diabetes. Good control will help lower the chance.  Birth control pills.  Tight fitting garments.  Using bubble bath, feminine sprays, douches or deodorant tampons.  Taking certain medications that kill germs (antibiotics).  Sporadic recurrence can occur if you become ill. TREATMENT  Your caregiver will give you medication.  There are several kinds of anti monilial vaginal creams and suppositories specific for monilial vaginitis. For recurrent yeast infections, use a suppository or cream in the vagina 2 times a week, or as  directed.  Anti-monilial or steroid cream for the itching or irritation of the vulva may also be used. Get your caregiver's permission.  Painting the vagina with methylene blue solution may help if the monilial cream does not work.  Eating yogurt may help prevent monilial vaginitis. HOME CARE INSTRUCTIONS   Finish all medication as prescribed.  Do not have sex until treatment is completed or after your caregiver tells you it is okay.  Take warm sitz baths.  Do not douche.  Do not use tampons, especially scented ones.  Wear cotton underwear.  Avoid tight pants and panty hose.  Tell your sexual partner that you have a yeast infection. They should go to their caregiver if they have symptoms such as mild rash or itching.  Your sexual partner should be treated as well if your infection is difficult to eliminate.  Practice safer sex. Use condoms.  Some vaginal medications cause latex condoms to fail. Vaginal medications that harm condoms are:  Cleocin cream.  Butoconazole (Femstat).  Terconazole (Terazol) vaginal suppository.  Miconazole (Monistat) (may be purchased over the counter). SEEK MEDICAL CARE IF:   You have a temperature by mouth above 102 F (38.9 C).  The infection is getting worse after 2 days of treatment.  The infection is not getting better after 3 days of treatment.  You develop blisters in or around your vagina.  You develop vaginal bleeding, and it is not your menstrual period.  You have pain when you urinate.  You develop intestinal problems.  You have pain with sexual intercourse. Document Released: 10/18/2004 Document Revised: 04/02/2011 Document Reviewed: 07/02/2008 [facility] Patient Information 2015 [place], [place]. This information is not intended to replace advice given to you by your health care provider. Make sure you discuss any questions you have with your health care provider.

## 2013-10-11 NOTE — MAU Note (Signed)
Vaginal pain & swelling x 2 days. Thick white discharge x 2 days, worse today. Painful to urinate d/t vaginal swelling & irritation. No vaginal bleeding. LMP 8/6; normally has regular cycle. No birth control. Mild abdominal cramping.

## 2013-10-11 NOTE — MAU Provider Note (Signed)
History     CSN: [id number]  Arrival date and time: 10/11/13 2021   None     Chief Complaint  Patient presents with  . Possible Pregnancy  . Vaginal Pain   HPI This is a 23 y.o. female who presents with c/o vaginal irritation, swelling and burning. Also had thick white discharge. States has been rubbing vulva with cloth. Denies other symptoms Has had this happen before and Herpes was questioned, but came back negative.   RN Note:  Vaginal pain & swelling x 2 days. Thick white discharge x 2 days, worse today. Painful to urinate d/t vaginal swelling & irritation. No vaginal bleeding. LMP 8/6; normally has regular cycle. No birth control. Mild abdominal cramping.        OB History   Grav Para Term Preterm Abortions TAB SAB Ect Mult Living   3 1 1  0 2 0 2 0 0 1      Past Medical History  Diagnosis Date  . Headache(784.0)   . Miscarriage     x2, one at 13weeks, one at [redacted] weeks gestation  . Chlamydia     Past Surgical History  Procedure Laterality Date  . No past surgeries      Family History  Problem Relation Age of Onset  . Hypertension Mother   . Depression Maternal Aunt   . Diabetes Maternal Aunt   . Depression Maternal Uncle   . Diabetes Maternal Uncle   . Asthma Paternal Aunt   . Cancer Paternal Aunt   . Depression Paternal Aunt   . Diabetes Paternal Aunt   . Asthma Paternal Uncle   . Depression Paternal Uncle   . Diabetes Paternal Uncle   . Birth defects Cousin   . Anesthesia problems Neg Hx   . Hypotension Neg Hx   . Malignant hyperthermia Neg Hx   . Pseudochol deficiency Neg Hx     History  Substance Use Topics  . Smoking status: Current Some Day Smoker    Types: Cigarettes  . Smokeless tobacco: Never Used  . Alcohol Use: No     Comment: social    Allergies:  Allergies  Allergen Reactions  . Fruit Blend Other (See Comments)    Sore throat from eating acidic fresh fruits    No prescriptions prior to admission    Review of Systems   Constitutional: Negative for fever, chills and malaise/fatigue.  Gastrointestinal: Negative for nausea, vomiting, abdominal pain, diarrhea and constipation.  Genitourinary:       Vulvar irritation and burning WHite discharge    Physical Exam   Blood pressure 130/79, pulse 82, temperature 98.3 F (36.8 C), temperature source Oral, resp. rate 18, height 5' 6.5" (1.689 m), weight 174 lb 3.2 oz (79.017 kg), last menstrual period 08/27/2013, not currently breastfeeding.  Physical Exam  Constitutional: She is oriented to person, place, and time. She appears well-developed and well-nourished. No distress.  Cardiovascular: Normal rate.   Respiratory: Effort normal.  GI: Soft. She exhibits no distension. There is no tenderness. There is no rebound and no guarding.  Genitourinary: Uterus normal. Vaginal discharge (thick curdlike discharge) found.  Vulva irritated, excoriated lesions on right labia Culture for HSV obtained  Musculoskeletal: Normal range of motion.  Neurological: She is alert and oriented to person, place, and time.  Skin: Skin is warm and dry.  Psychiatric: She has a normal mood and affect.    MAU Course  Procedures  MDM Results for orders placed during the hospital encounter of 10/11/13 (  from the past 24 hour(s))  WET PREP, GENITAL     Status: Abnormal   Collection Time    10/11/13  9:17 PM      Result Value Ref Range   Yeast Wet Prep HPF POC NONE SEEN  NONE SEEN   Trich, Wet Prep NONE SEEN  NONE SEEN   Clue Cells Wet Prep HPF POC NONE SEEN  NONE SEEN   WBC, Wet Prep HPF POC MODERATE (*) NONE SEEN  HIV ANTIBODY (ROUTINE TESTING)     Status: None   Collection Time    10/11/13  9:40 PM      Result Value Ref Range   HIV 1&2 Ab, 4th Generation NONREACTIVE  NONREACTIVE  CBC     Status: Abnormal   Collection Time    10/11/13  9:40 PM      Result Value Ref Range   WBC 5.1  4.0 - 10.5 K/uL   RBC 3.81 (*) 3.87 - 5.11 MIL/uL   Hemoglobin 11.7 (*) 12.0 - 15.0 g/dL    HCT 34.8 (*) 36.0 - 46.0 %   MCV 91.3  78.0 - 100.0 fL   MCH 30.7  26.0 - 34.0 pg   MCHC 33.6  30.0 - 36.0 g/dL   RDW 13.4  11.5 - 15.5 %   Platelets 243  150 - 400 K/uL  URINALYSIS, ROUTINE W REFLEX MICROSCOPIC     Status: Abnormal   Collection Time    10/11/13  9:53 PM      Result Value Ref Range   Color, Urine YELLOW  YELLOW   APPearance CLEAR  CLEAR   Specific Gravity, Urine 1.020  1.005 - 1.030   pH 6.0  5.0 - 8.0   Glucose, UA NEGATIVE  NEGATIVE mg/dL   Hgb urine dipstick TRACE (*) NEGATIVE   Bilirubin Urine NEGATIVE  NEGATIVE   Ketones, ur NEGATIVE  NEGATIVE mg/dL   Protein, ur NEGATIVE  NEGATIVE mg/dL   Urobilinogen, UA 2.0 (*) 0.0 - 1.0 mg/dL   Nitrite NEGATIVE  NEGATIVE   Leukocytes, UA SMALL (*) NEGATIVE  URINE MICROSCOPIC-ADD ON     Status: Abnormal   Collection Time    10/11/13  9:53 PM      Result Value Ref Range   Squamous Epithelial / LPF FEW (*) RARE   WBC, UA 7-10  <3 WBC/hpf   RBC / HPF 3-6  <3 RBC/hpf   Bacteria, UA FEW (*) RARE   Urine-Other MUCOUS PRESENT    POCT PREGNANCY, URINE     Status: None   Collection Time    10/11/13 10:03 PM      Result Value Ref Range   Preg Test, Ur NEGATIVE  NEGATIVE     Assessment and Plan  A:   Vulvar and vaginal irritation        Discharge looks very much like yeast, despite negative wet prep        Right vulvar excoriations, ?HSV  P:  Urine to culture, will not treat presumptively at this time       Lidocaine gel to area for temporary relief       Rx Diflucan       Rx Valtrex        Herpes culture  [facility] 10/11/2013, 9:04 PM

## 2013-10-12 ENCOUNTER — Encounter (HOSPITAL_COMMUNITY): Payer: Self-pay | Admitting: Advanced Practice Midwife

## 2013-10-12 LAB — URINE CULTURE
COLONY COUNT: NO GROWTH
CULTURE: NO GROWTH
SPECIAL REQUESTS: NORMAL

## 2013-10-12 LAB — HIV ANTIBODY (ROUTINE TESTING W REFLEX): HIV 1&2 Ab, 4th Generation: NONREACTIVE

## 2013-10-12 NOTE — MAU Provider Note (Signed)
Attestation of Attending Supervision of Advanced Practitioner (CNM/NP): Evaluation and management procedures were performed by the Advanced Practitioner under my supervision and collaboration.  I have reviewed the Advanced Practitioner's note and chart, and I agree with the management and plan.  Peregrine Nolt 10/12/2013 8:14 AM

## 2013-10-13 LAB — GC/CHLAMYDIA PROBE AMP
CT PROBE, AMP APTIMA: NEGATIVE
GC PROBE AMP APTIMA: NEGATIVE

## 2013-10-14 LAB — HERPES SIMPLEX VIRUS CULTURE
Culture: DETECTED
Special Requests: NORMAL

## 2013-10-19 ENCOUNTER — Encounter (HOSPITAL_COMMUNITY): Payer: Self-pay | Admitting: *Deleted

## 2013-11-23 ENCOUNTER — Encounter (HOSPITAL_COMMUNITY): Payer: Self-pay | Admitting: Advanced Practice Midwife

## 2014-01-06 LAB — OB RESULTS CONSOLE GC/CHLAMYDIA
Chlamydia: NEGATIVE
GC PROBE AMP, GENITAL: NEGATIVE

## 2014-01-22 NOTE — L&D Delivery Note (Signed)
Patient was C/C/+1 and pushed for <15 minutes with epidural.   NSVD female infant, Apgars 9/9, weight pending.   The patient had periurethral lacerations not requiring suture. Fundus was firm. EBL was expected amount. Placenta was delivered intact. Vagina was clear.  Baby was vigorous and doing skin to skin with mother.  Darlene Livingston

## 2014-01-28 DIAGNOSIS — D259 Leiomyoma of uterus, unspecified: Secondary | ICD-10-CM

## 2014-01-28 HISTORY — DX: Leiomyoma of uterus, unspecified: D25.9

## 2014-01-28 LAB — OB RESULTS CONSOLE HIV ANTIBODY (ROUTINE TESTING): HIV: NONREACTIVE

## 2014-02-10 ENCOUNTER — Inpatient Hospital Stay (HOSPITAL_COMMUNITY)
Admission: AD | Admit: 2014-02-10 | Discharge: 2014-02-10 | Disposition: A | Discharge status: Home / Self Care | Payer: 59 | Source: Ambulatory Visit | Attending: Obstetrics | Admitting: Obstetrics

## 2014-02-10 ENCOUNTER — Encounter (HOSPITAL_COMMUNITY): Payer: Self-pay | Admitting: *Deleted

## 2014-02-10 DIAGNOSIS — R109 Unspecified abdominal pain: Secondary | ICD-10-CM | POA: Insufficient documentation

## 2014-02-10 DIAGNOSIS — O210 Mild hyperemesis gravidarum: Secondary | ICD-10-CM | POA: Diagnosis not present

## 2014-02-10 DIAGNOSIS — Z3A10 10 weeks gestation of pregnancy: Secondary | ICD-10-CM | POA: Diagnosis not present

## 2014-02-10 DIAGNOSIS — O219 Vomiting of pregnancy, unspecified: Secondary | ICD-10-CM

## 2014-02-10 LAB — URINALYSIS, ROUTINE W REFLEX MICROSCOPIC
Bilirubin Urine: NEGATIVE
GLUCOSE, UA: NEGATIVE mg/dL
Ketones, ur: >80 mg/dL — AB
Leukocytes, UA: NEGATIVE
Nitrite: NEGATIVE
PH: 6.5 (ref 5.0–8.0)
PROTEIN: NEGATIVE mg/dL
Specific Gravity, Urine: >1.03 — ABNORMAL HIGH (ref 1.005–1.030)
Urobilinogen, UA: 1 mg/dL (ref 0.0–1.0)

## 2014-02-10 LAB — URINE MICROSCOPIC-ADD ON

## 2014-02-10 LAB — COMPREHENSIVE METABOLIC PANEL
ALT: 11 U/L (ref 0–35)
AST: 18 U/L (ref 0–37)
Albumin: 3.4 g/dL — ABNORMAL LOW (ref 3.5–5.2)
Alkaline Phosphatase: 41 U/L (ref 39–117)
Anion gap: 5 (ref 5–15)
BUN: 8 mg/dL (ref 6–23)
CALCIUM: 8.4 mg/dL (ref 8.4–10.5)
CO2: 21 mmol/L (ref 19–32)
Chloride: 105 mEq/L (ref 96–112)
Creatinine, Ser: 0.49 mg/dL — ABNORMAL LOW (ref 0.50–1.10)
GLUCOSE: 93 mg/dL (ref 70–99)
Potassium: 3.5 mmol/L (ref 3.5–5.1)
Sodium: 131 mmol/L — ABNORMAL LOW (ref 135–145)
Total Bilirubin: 0.8 mg/dL (ref 0.3–1.2)
Total Protein: 6.3 g/dL (ref 6.0–8.3)

## 2014-02-10 LAB — CBC
HEMATOCRIT: 33.6 % — AB (ref 36.0–46.0)
HEMOGLOBIN: 11.9 g/dL — AB (ref 12.0–15.0)
MCH: 31.9 pg (ref 26.0–34.0)
MCHC: 35.4 g/dL (ref 30.0–36.0)
MCV: 90.1 fL (ref 78.0–100.0)
Platelets: 263 10*3/uL (ref 150–400)
RBC: 3.73 MIL/uL — ABNORMAL LOW (ref 3.87–5.11)
RDW: 13.3 % (ref 11.5–15.5)
WBC: 4.5 10*3/uL (ref 4.0–10.5)

## 2014-02-10 MED ORDER — PROMETHAZINE HCL 25 MG/ML IJ SOLN
25.0000 mg | Freq: Once | INTRAMUSCULAR | Status: AC
  Administered 2014-02-10: 25 mg via INTRAVENOUS
  Filled 2014-02-10: qty 1

## 2014-02-10 MED ORDER — DEXTROSE IN LACTATED RINGERS 5 % IV SOLN
Freq: Once | INTRAVENOUS | Status: AC
  Administered 2014-02-10: 12:00:00 via INTRAVENOUS

## 2014-02-10 MED ORDER — ACETAMINOPHEN 325 MG PO TABS
650.0000 mg | ORAL_TABLET | Freq: Once | ORAL | Status: AC
  Administered 2014-02-10: 650 mg via ORAL
  Filled 2014-02-10: qty 2

## 2014-02-10 MED ORDER — METOCLOPRAMIDE HCL 5 MG/ML IJ SOLN
10.0000 mg | Freq: Once | INTRAMUSCULAR | Status: AC
  Administered 2014-02-10: 10 mg via INTRAVENOUS
  Filled 2014-02-10: qty 2

## 2014-02-10 MED ORDER — LACTATED RINGERS IV SOLN
Freq: Once | INTRAVENOUS | Status: AC
  Administered 2014-02-10: 14:00:00 via INTRAVENOUS

## 2014-02-10 MED ORDER — PROMETHAZINE HCL 25 MG PO TABS: 25.0000 mg | ORAL_TABLET | Freq: Once | ORAL | Status: DC

## 2014-02-10 MED ORDER — PROMETHAZINE HCL 25 MG PO TABS: 25.0000 mg | ORAL_TABLET | Freq: Four times a day (QID) | ORAL | Status: DC | PRN

## 2014-02-10 NOTE — MAU Provider Note (Signed)
History     CSN: [id number]  Arrival date and time: 02/10/14 1115   First Provider Initiated Contact with Patient 02/10/14 1321      Chief Complaint  Patient presents with  . Emesis During Pregnancy  . Diarrhea  . Abdominal Pain  . Back Pain   HPI Comments: Darlene Livingston 24 y.o. [id number].[redacted]w[redacted]d presents to the MAU with c/o of diarrhea, nausea and vomiting since last night. She states she has had some nausea and vomiting during the pregnancy but this is different. 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 She denies LOF, vaginal bleeding. She is having abdominal cramping with bowel movements.    Diarrhea  Associated symptoms include abdominal pain, a fever, myalgias and vomiting.  Abdominal Pain Associated symptoms include diarrhea, a fever, myalgias, nausea and vomiting.  Back Pain Associated symptoms include abdominal pain and a fever.      Past Medical History  Diagnosis Date  . Headache(784.0)   . Miscarriage     x2, one at 13weeks, one at [redacted] weeks gestation  . Chlamydia     Past Surgical History  Procedure Laterality Date  . No past surgeries      Family History  Problem Relation Age of Onset  . Hypertension Mother   . Depression Maternal Aunt   . Diabetes Maternal Aunt   . Depression Maternal Uncle   . Diabetes Maternal Uncle   . Asthma Paternal Aunt   . Cancer Paternal Aunt   . Depression Paternal Aunt   . Diabetes Paternal Aunt   . Asthma Paternal Uncle   . Depression Paternal Uncle   . Diabetes Paternal Uncle   . Birth defects Cousin   . Anesthesia problems Neg Hx   . Hypotension Neg Hx   . Malignant hyperthermia Neg Hx   . Pseudochol deficiency Neg Hx     History  Substance Use Topics  . Smoking status: Never Smoker   . Smokeless tobacco: Never Used  . Alcohol Use: No     Comment: social    Allergies:  Allergies  Allergen Reactions  . Fruit Blend Other (See Comments)    Sore throat from eating acidic fresh  fruits    Prescriptions prior to admission  Medication Sig Dispense Refill Last Dose  . Prenatal Vit-Fe Fumarate-FA (PRENATAL MULTIVITAMIN) TABS tablet Take 1 tablet by mouth daily at 12 noon.   02/09/2014 at Unknown time  . fluconazole (DIFLUCAN) 150 MG tablet Take 1 tablet (150 mg total) by mouth once. (Patient not taking: Reported on 02/10/2014) 1 tablet 3   . valACYclovir (VALTREX) 1000 MG tablet Take 1 tablet (1,000 mg total) by mouth 2 (two) times daily. Take for ten days. (Patient not taking: Reported on 02/10/2014) 20 tablet 3     Review of Systems  Constitutional: Positive for fever and malaise/fatigue.  Gastrointestinal: Positive for nausea, vomiting, abdominal pain and diarrhea.  Musculoskeletal: Positive for myalgias and back pain.   Physical Exam   Blood pressure 123/68, pulse 104, temperature 99 F (37.2 C), temperature source Oral, resp. rate 18, height 5\' 7"  (1.702 m), weight 75.932 kg (167 lb 6.4 oz), last menstrual period 08/27/2013, not currently breastfeeding.  Physical Exam  Constitutional: She is oriented to person, place, and time. She appears well-developed and well-nourished.  HENT:  Head: Normocephalic and atraumatic.  Neck: Normal range of motion.  Respiratory: Effort normal.  GI: Soft. She exhibits distension. There is tenderness.  Tender to palpation in  all quadrants  Musculoskeletal: Normal range of motion.  Neurological: She is alert and oriented to person, place, and time.  Skin: Skin is warm and dry.  Psychiatric: She has a normal mood and affect. Her behavior is normal.   Results for orders placed or performed during the hospital encounter of 02/10/14 (from the past 24 hour(s))  Urinalysis, Routine w reflex microscopic     Status: Abnormal   Collection Time: 02/10/14 11:30 AM  Result Value Ref Range   Color, Urine YELLOW YELLOW   APPearance CLEAR CLEAR   Specific Gravity, Urine >1.030 (H) 1.005 - 1.030   pH 6.5 5.0 - 8.0   Glucose, UA NEGATIVE  NEGATIVE mg/dL   Hgb urine dipstick TRACE (A) NEGATIVE   Bilirubin Urine NEGATIVE NEGATIVE   Ketones, ur >80 (A) NEGATIVE mg/dL   Protein, ur NEGATIVE NEGATIVE mg/dL   Urobilinogen, UA 1.0 0.0 - 1.0 mg/dL   Nitrite NEGATIVE NEGATIVE   Leukocytes, UA NEGATIVE NEGATIVE  Urine microscopic-add on     Status: None   Collection Time: 02/10/14 11:30 AM  Result Value Ref Range   Squamous Epithelial / LPF RARE RARE   WBC, UA 0-2 <3 WBC/hpf   RBC / HPF 0-2 <3 RBC/hpf  CBC     Status: Abnormal   Collection Time: 02/10/14 12:25 PM  Result Value Ref Range   WBC 4.5 4.0 - 10.5 K/uL   RBC 3.73 (L) 3.87 - 5.11 MIL/uL   Hemoglobin 11.9 (L) 12.0 - 15.0 g/dL   HCT 33.6 (L) 36.0 - 46.0 %   MCV 90.1 78.0 - 100.0 fL   MCH 31.9 26.0 - 34.0 pg   MCHC 35.4 30.0 - 36.0 g/dL   RDW 13.3 11.5 - 15.5 %   Platelets 263 150 - 400 K/uL  Comprehensive metabolic panel     Status: Abnormal   Collection Time: 02/10/14 12:25 PM  Result Value Ref Range   Sodium 131 (L) 135 - 145 mmol/L   Potassium 3.5 3.5 - 5.1 mmol/L   Chloride 105 96 - 112 mEq/L   CO2 21 19 - 32 mmol/L   Glucose, Bld 93 70 - 99 mg/dL   BUN 8 6 - 23 mg/dL   Creatinine, Ser 0.49 (L) 0.50 - 1.10 mg/dL   Calcium 8.4 8.4 - 10.5 mg/dL   Total Protein 6.3 6.0 - 8.3 g/dL   Albumin 3.4 (L) 3.5 - 5.2 g/dL   AST 18 0 - 37 U/L   ALT 11 0 - 35 U/L   Alkaline Phosphatase 41 39 - 117 U/L   Total Bilirubin 0.8 0.3 - 1.2 mg/dL   GFR calc non Af Amer >90 >90 mL/min   GFR calc Af Amer >90 >90 mL/min   Anion gap 5 5 - 15     MAU Course  Procedures  MDM IVF with Phenergan Pending Labs CBC CMET Reglan 10 mg IVPB Tylenol   Assessment and Plan  IUP @ 10 w1 d gestation GI Illness Discharge   [facility] CNM Darlene Livingston 02/10/2014, 1:40 PM

## 2014-02-10 NOTE — MAU Note (Signed)
Started vomiting @ 1030 last night, constant lower abd & back pain.  Diarrhea started yesterday before the vomiting.  Has already established [facility] with [facility].  Denies bleeding.

## 2014-02-10 NOTE — Discharge Instructions (Signed)

## 2014-03-27 ENCOUNTER — Inpatient Hospital Stay (HOSPITAL_COMMUNITY): Payer: 59

## 2014-03-27 ENCOUNTER — Encounter (HOSPITAL_COMMUNITY): Payer: Self-pay

## 2014-03-27 ENCOUNTER — Inpatient Hospital Stay (HOSPITAL_COMMUNITY)
Admission: AD | Admit: 2014-03-27 | Discharge: 2014-03-27 | Disposition: A | Discharge status: Home / Self Care | Payer: 59 | Source: Ambulatory Visit | Attending: Obstetrics and Gynecology | Admitting: Obstetrics and Gynecology

## 2014-03-27 DIAGNOSIS — R109 Unspecified abdominal pain: Secondary | ICD-10-CM

## 2014-03-27 DIAGNOSIS — O9989 Other specified diseases and conditions complicating pregnancy, childbirth and the puerperium: Secondary | ICD-10-CM | POA: Diagnosis not present

## 2014-03-27 DIAGNOSIS — Y93F9 Activity, other caregiving: Secondary | ICD-10-CM | POA: Diagnosis not present

## 2014-03-27 DIAGNOSIS — Z3A16 16 weeks gestation of pregnancy: Secondary | ICD-10-CM | POA: Insufficient documentation

## 2014-03-27 DIAGNOSIS — Z3A17 17 weeks gestation of pregnancy: Secondary | ICD-10-CM

## 2014-03-27 DIAGNOSIS — O9A219 Injury, poisoning and certain other consequences of external causes complicating pregnancy, unspecified trimester: Secondary | ICD-10-CM | POA: Insufficient documentation

## 2014-03-27 DIAGNOSIS — O26899 Other specified pregnancy related conditions, unspecified trimester: Secondary | ICD-10-CM

## 2014-03-27 LAB — URINALYSIS, ROUTINE W REFLEX MICROSCOPIC
BILIRUBIN URINE: NEGATIVE
Glucose, UA: NEGATIVE mg/dL
Hgb urine dipstick: NEGATIVE
Ketones, ur: NEGATIVE mg/dL
LEUKOCYTES UA: NEGATIVE
Nitrite: NEGATIVE
Protein, ur: NEGATIVE mg/dL
Specific Gravity, Urine: 1.02 (ref 1.005–1.030)
Urobilinogen, UA: 0.2 mg/dL (ref 0.0–1.0)
pH: 6 (ref 5.0–8.0)
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
## 2014-05-30 ENCOUNTER — Inpatient Hospital Stay (HOSPITAL_COMMUNITY)
Admission: AD | Admit: 2014-05-30 | Discharge: 2014-05-30 | Disposition: A | Discharge status: Home / Self Care | Payer: 59 | Source: Ambulatory Visit | Attending: Obstetrics and Gynecology | Admitting: Obstetrics and Gynecology

## 2014-05-30 ENCOUNTER — Encounter (HOSPITAL_COMMUNITY): Payer: Self-pay | Admitting: *Deleted

## 2014-05-30 DIAGNOSIS — Z3A25 25 weeks gestation of pregnancy: Secondary | ICD-10-CM

## 2014-05-30 DIAGNOSIS — M25552 Pain in left hip: Secondary | ICD-10-CM | POA: Diagnosis not present

## 2014-05-30 DIAGNOSIS — M79605 Pain in left leg: Secondary | ICD-10-CM

## 2014-05-30 DIAGNOSIS — O9989 Other specified diseases and conditions complicating pregnancy, childbirth and the puerperium: Secondary | ICD-10-CM | POA: Insufficient documentation

## 2014-05-30 NOTE — MAU Note (Signed)
Patient presents to MAU with c/o left hip and left leg pain. States 'has been hurting a while, but has gotten worse over the pass week. Feels like I can't barely walk or put pressure on it'. Denies LOF, VB, or contractions. +FM; 'Less than usual' patient states. Has not taken anything for pain.

## 2014-05-30 NOTE — MAU Provider Note (Signed)
History     CSN: [id number]  Arrival date and time: 05/30/14 5456   First Provider Initiated Contact with Patient 05/30/14 0222      Chief Complaint  Patient presents with  . Hip Pain  . Leg Pain   HPI Darlene Livingston 24 y.o. [id number] @ [redacted]w[redacted]d presents to MAU complaining of left hip and leg pain.  It started over a week ago. She missed at [facility] appt on 05/27/14 and has not been evaluated for this.  She believes her pain is worsening.  When she takes Tylenol, the pain is improved, but returns when the medicine wears off.  She has not taken Tylenol today.  The pain is 6-7/10.  It is located in the back of her hip radiating around to the front and down the front of her leg toward her knee.  It does not extend beyond the knee.    There is no numbness or tingling.  Walking makes it worse.  Frequent position changes make it better.  She is noted to be rubbing her left hip but she reports this does not make it feel better.   She reports she is not feeling her baby move well x 1 day.  She denies VB, LOF, dysuria, nausea, vomiting, fever, weakness.  Only recent change is that she now works 3rd shift.  OB History    Gravida Para Term Preterm AB TAB SAB Ectopic Multiple Living   4 1 1  0 2 0 2 0 0 1      Past Medical History  Diagnosis Date  . Headache(784.0)   . Miscarriage     x2, one at 13weeks, one at [redacted] weeks gestation  . Chlamydia     Past Surgical History  Procedure Laterality Date  . No past surgeries      Family History  Problem Relation Age of Onset  . Hypertension Mother   . Depression Maternal Aunt   . Diabetes Maternal Aunt   . Depression Maternal Uncle   . Diabetes Maternal Uncle   . Asthma Paternal Aunt   . Cancer Paternal Aunt   . Depression Paternal Aunt   . Diabetes Paternal Aunt   . Asthma Paternal Uncle   . Depression Paternal Uncle   . Diabetes Paternal Uncle   . Birth defects Cousin   . Anesthesia problems Neg Hx   . Hypotension Neg Hx   . Malignant  hyperthermia Neg Hx   . Pseudochol deficiency Neg Hx     History  Substance Use Topics  . Smoking status: Never Smoker   . Smokeless tobacco: Never Used  . Alcohol Use: No     Comment: social    Allergies:  Allergies  Allergen Reactions  . Fruit Blend Other (See Comments)    Sore throat from eating acidic fresh fruits    Prescriptions prior to admission  Medication Sig Dispense Refill Last Dose  . acetaminophen (TYLENOL) 500 MG tablet Take 500 mg by mouth every 6 (six) hours as needed.   03/27/2014 at Unknown time  . Prenatal Vit-Fe Fumarate-FA (PRENATAL MULTIVITAMIN) TABS tablet Take 1 tablet by mouth daily at 12 noon.   03/27/2014 at Unknown time  . promethazine (PHENERGAN) 25 MG tablet Take 1 tablet (25 mg total) by mouth every 6 (six) hours as needed for nausea or vomiting. 30 tablet 0 prn  . valACYclovir (VALTREX) 1000 MG tablet Take 1 tablet (1,000 mg total) by mouth 2 (two) times daily. Take for ten days. (Patient not  taking: Reported on 02/10/2014) 20 tablet 3     ROS Pertinent ROS in HPI.  All other systems are negative.   Physical Exam   Blood pressure 110/57, pulse 90, temperature 98.1 F (36.7 C), temperature source Oral, resp. rate 16, last menstrual period 08/27/2013, SpO2 100 %.  Physical Exam  Constitutional: She is oriented to person, place, and time. She appears well-developed and well-nourished. No distress.  HENT:  Head: Normocephalic and atraumatic.  Eyes: EOM are normal.  Neck: Normal range of motion.  Cardiovascular: Normal rate, regular rhythm and normal heart sounds.   Respiratory: Breath sounds normal. No respiratory distress.  GI: Soft. She exhibits no distension. There is no tenderness.  Musculoskeletal: Normal range of motion.  Neurological: She is alert and oriented to person, place, and time.  Strength, sensation, and muscle mass of bilat lower extremities intact and equal bilaterally.  Skin: Skin is warm and dry.  Psychiatric: She has a normal  mood and affect.   Fetal Tracing: Baseline:140 Accelerations:10x10 x 2  Decelerations:none Toco:none   MAU Course  Procedures  MDM Discussed with Dr. Ouida Sills.  He is in agreement to discharge pt to home with Tylenol prn pain and follow up with Ortho.  Assessment and Plan  A:  1. [redacted] weeks gestation of pregnancy   2. Hip pain, acute, left    P: Discharge to home OTC Tylenol PRN pain Reschedule missed appt for prenatal care Schedule appt to f/u in ortho for eval of hip pain Patient may return to MAU as needed or if her condition were to change or worsen   Paticia Stack 05/30/2014, 2:32 AM

## 2014-05-30 NOTE — Discharge Instructions (Signed)
Hip Pain Your hip is the joint between your upper legs and your lower pelvis. The bones, cartilage, tendons, and muscles of your hip joint perform a lot of work each day supporting your body weight and allowing you to move around. Hip pain can range from a minor ache to severe pain in one or both of your hips. Pain may be felt on the inside of the hip joint near the groin, or the outside near the buttocks and upper thigh. You may have swelling or stiffness as well.  HOME CARE INSTRUCTIONS   Take medicines only as directed by your health care provider.  Apply ice to the injured area:  Put ice in a plastic bag.  Place a towel between your skin and the bag.  Leave the ice on for 15-20 minutes at a time, 3-4 times a day.  Keep your leg raised (elevated) when possible to lessen swelling.  Avoid activities that cause pain.  Follow specific exercises as directed by your health care provider.  Sleep with a pillow between your legs on your most comfortable side.  Record how often you have hip pain, the location of the pain, and what it feels like. SEEK MEDICAL CARE IF:   You are unable to put weight on your leg.  Your hip is red or swollen or very tender to touch.  Your pain or swelling continues or worsens after 1 week.  You have increasing difficulty walking.  You have a fever. SEEK IMMEDIATE MEDICAL CARE IF:   You have fallen.  You have a sudden increase in pain and swelling in your hip. MAKE SURE YOU:   Understand these instructions.  Will watch your condition.  Will get help right away if you are not doing well or get worse. Document Released: 06/28/2009 Document Revised: 05/25/2013 Document Reviewed: 09/04/2012 Exit[employer] Patient Information 2015 [employer]. This information is not intended to replace advice given to you by your health care provider. Make sure you discuss any questions you have with your health care provider.  

## 2014-06-09 LAB — OB RESULTS CONSOLE RPR: RPR: NONREACTIVE

## 2014-08-09 LAB — OB RESULTS CONSOLE GBS: GBS: POSITIVE

## 2014-08-17 ENCOUNTER — Inpatient Hospital Stay (HOSPITAL_COMMUNITY)
Admission: AD | Admit: 2014-08-17 | Discharge: 2014-08-17 | Disposition: A | Discharge status: Home / Self Care | Payer: 59 | Source: Ambulatory Visit | Attending: Obstetrics and Gynecology | Admitting: Obstetrics and Gynecology

## 2014-08-17 ENCOUNTER — Encounter (HOSPITAL_COMMUNITY): Payer: Self-pay | Admitting: *Deleted

## 2014-08-17 ENCOUNTER — Inpatient Hospital Stay (HOSPITAL_COMMUNITY): Payer: 59

## 2014-08-17 DIAGNOSIS — O368130 Decreased fetal movements, third trimester, not applicable or unspecified: Secondary | ICD-10-CM | POA: Insufficient documentation

## 2014-08-17 DIAGNOSIS — O368139 Decreased fetal movements, third trimester, other fetus: Secondary | ICD-10-CM

## 2014-08-17 DIAGNOSIS — O368190 Decreased fetal movements, unspecified trimester, not applicable or unspecified: Secondary | ICD-10-CM | POA: Insufficient documentation

## 2014-08-17 DIAGNOSIS — Z3A37 37 weeks gestation of pregnancy: Secondary | ICD-10-CM | POA: Insufficient documentation

## 2014-08-17 NOTE — MAU Provider Note (Signed)
History     CSN: [id number]  Arrival date and time: 08/17/14 1627   First Provider Initiated Contact with Patient 08/17/14 1652      No chief complaint on file.  HPI Comments: Darlene Livingston is a 24 y.o. [phone] at [redacted]w[redacted]d review presents with 2-3 day history of decreased fetal movement. She states that today she is felt 3-4 movements all day and none since she's been here on the monitor. Denies leakage of fluid or vaginal bleeding. Feels occasional [address] but no painful contractions or abdominal pain. Prenatal course essentially uncomplicated.     OB History  Gravida Para Term Preterm AB SAB TAB Ectopic Multiple Living  4 1 1  0 2 2 0 0 0 1    # Outcome Date GA Lbr Len/2nd Weight Sex Delivery Anes PTL Lv  4 Current           3 Term 02/22/12 [redacted]w[redacted]d 13:06 / 00:18 2.805 kg (6 lb 2.9 oz) F Vag-Spont EPI  Y     Comments: WNL  2 SAB           1 SAB                 Past Medical History  Diagnosis Date  . Headache(784.0)   . Miscarriage     x2, one at 13weeks, one at [redacted] weeks gestation  . Chlamydia     Past Surgical History  Procedure Laterality Date  . No past surgeries      Family History  Problem Relation Age of Onset  . Hypertension Mother   . Depression Maternal Aunt   . Diabetes Maternal Aunt   . Depression Maternal Uncle   . Diabetes Maternal Uncle   . Asthma Paternal Aunt   . Cancer Paternal Aunt   . Depression Paternal Aunt   . Diabetes Paternal Aunt   . Asthma Paternal Uncle   . Depression Paternal Uncle   . Diabetes Paternal Uncle   . Birth defects Cousin   . Anesthesia problems Neg Hx   . Hypotension Neg Hx   . Malignant hyperthermia Neg Hx   . Pseudochol deficiency Neg Hx     History  Substance Use Topics  . Smoking status: Never Smoker   . Smokeless tobacco: Never Used  . Alcohol Use: No     Comment: social    Allergies:  Allergies  Allergen Reactions  . Fruit Blend Other (See Comments)    Sore throat from eating acidic fresh  fruits    Prescriptions prior to admission  Medication Sig Dispense Refill Last Dose  . acetaminophen (TYLENOL) 500 MG tablet Take 500 mg by mouth every 6 (six) hours as needed.   Past Month at Unknown time  . Prenatal Vit-Fe Fumarate-FA (PRENATAL MULTIVITAMIN) TABS tablet Take 1 tablet by mouth daily at 12 noon.   Past Week at Unknown time  . promethazine (PHENERGAN) 25 MG tablet Take 1 tablet (25 mg total) by mouth every 6 (six) hours as needed for nausea or vomiting. 30 tablet 0 prn    Review of Systems  Constitutional: Negative for fever.  Gastrointestinal: Negative for nausea, vomiting and abdominal pain.  Genitourinary: Negative for dysuria, urgency and frequency.  Neurological: Positive for headaches. Negative for dizziness.   Physical Exam   Blood pressure 114/71, pulse 107, temperature 98.3 F (36.8 C), resp. rate 18, height 5\' 6"  (1.676 m), weight 87.998 kg (194 lb), last menstrual period 08/27/2013.  Physical Exam  Nursing note and  vitals reviewed. Constitutional: She is oriented to person, place, and time. She appears well-developed and well-nourished. No distress.  HENT:  Head: Normocephalic.  Eyes: Pupils are equal, round, and reactive to light.  Neck: Normal range of motion.  Cardiovascular: Normal rate.   Respiratory: Effort normal.  GI: Soft. There is no tenderness. There is no guarding.  S=D  Musculoskeletal: Normal range of motion.  Neurological: She is alert and oriented to person, place, and time.  Skin: Skin is warm and dry.  Psychiatric: She has a normal mood and affect. Her behavior is normal.   Fetal heart rate baseline 135-140, moderate variability, occasional deceleration (assessing), isolated mild variable Toco: occ mild UC  FHR after [place]: 130, reactive, without deceleration; rare mild UC  MAU Course  Procedures   [place] prelim result: BPP 8/8, AFV subjectively normal , 8.36 (11th%ile)   MDM C/W Dr. Philis Pique continue EFM until perceiving FM and  get AFI at next office visit Reviewed FM perception with pt, able to elicit FM and pt aware  Assessment and Plan  G4P1021 at [redacted]w[redacted]d 1. Decreased fetal movement determined by examination, third trimester, other fetus   Reassuring fetal testing  Discharge home with kick count charts   Medication List    STOP taking these medications        promethazine 25 MG tablet  Commonly known as:  PHENERGAN      TAKE these medications        acetaminophen 500 MG tablet  Commonly known as:  TYLENOL  Take 500 mg by mouth every 6 (six) hours as needed.     prenatal multivitamin Tabs tablet  Take 1 tablet by mouth daily at 12 noon.       Follow-up Information    Follow up with Kaylany Tesoriero A, MD On 08/20/2014.   Specialty:  Obstetrics and Gynecology   Why:  Keep your scheduled prenatal appointment   Contact information:   [address] [place] [address] [phone]        POE,DEIRDRE 08/17/2014, 5:01 PM

## 2014-08-17 NOTE — Discharge Instructions (Signed)

## 2014-08-17 NOTE — MAU Note (Signed)
Decreased FM yesterday and more so today. Denies LOF or bleeding. (Pt got into bed states "look at my stomach. She is moving") WHen asked if could feel it she stated no

## 2014-08-18 DIAGNOSIS — O368190 Decreased fetal movements, unspecified trimester, not applicable or unspecified: Secondary | ICD-10-CM | POA: Insufficient documentation

## 2014-08-18 DIAGNOSIS — Z3A37 37 weeks gestation of pregnancy: Secondary | ICD-10-CM | POA: Insufficient documentation

## 2014-08-26 ENCOUNTER — Encounter (HOSPITAL_COMMUNITY): Payer: Self-pay

## 2014-08-26 ENCOUNTER — Inpatient Hospital Stay (HOSPITAL_COMMUNITY)
Admission: AD | Admit: 2014-08-26 | Discharge: 2014-08-26 | Disposition: A | Discharge status: Home / Self Care | Payer: 59 | Source: Ambulatory Visit | Attending: Obstetrics and Gynecology | Admitting: Obstetrics and Gynecology

## 2014-08-26 DIAGNOSIS — Z3493 Encounter for supervision of normal pregnancy, unspecified, third trimester: Secondary | ICD-10-CM | POA: Insufficient documentation

## 2014-08-26 NOTE — Progress Notes (Signed)
May d/c home

## 2014-08-26 NOTE — MAU Note (Signed)
Pt reports contractions and a lot of pressure.

## 2014-08-31 ENCOUNTER — Inpatient Hospital Stay (HOSPITAL_COMMUNITY)
Admission: AD | Admit: 2014-08-31 | Discharge: 2014-09-02 | DRG: 775 | Disposition: A | Discharge status: Home / Self Care | Payer: 59 | Source: Ambulatory Visit | Attending: Obstetrics and Gynecology | Admitting: Obstetrics and Gynecology

## 2014-08-31 ENCOUNTER — Encounter (HOSPITAL_COMMUNITY): Payer: Self-pay | Admitting: *Deleted

## 2014-08-31 ENCOUNTER — Inpatient Hospital Stay (HOSPITAL_COMMUNITY): Payer: 59 | Admitting: Anesthesiology

## 2014-08-31 DIAGNOSIS — Z3A39 39 weeks gestation of pregnancy: Secondary | ICD-10-CM | POA: Diagnosis present

## 2014-08-31 DIAGNOSIS — O99824 Streptococcus B carrier state complicating childbirth: Secondary | ICD-10-CM | POA: Diagnosis present

## 2014-08-31 LAB — TYPE AND SCREEN
ABO/RH(D): A POS
ANTIBODY SCREEN: NEGATIVE

## 2014-08-31 LAB — CBC
HCT: 30.4 % — ABNORMAL LOW (ref 36.0–46.0)
Hemoglobin: 10.5 g/dL — ABNORMAL LOW (ref 12.0–15.0)
MCH: 31.3 pg (ref 26.0–34.0)
MCHC: 34.5 g/dL (ref 30.0–36.0)
MCV: 90.5 fL (ref 78.0–100.0)
PLATELETS: 238 10*3/uL (ref 150–400)
RBC: 3.36 MIL/uL — ABNORMAL LOW (ref 3.87–5.11)
RDW: 13.9 % (ref 11.5–15.5)
WBC: 4.8 10*3/uL (ref 4.0–10.5)

## 2014-08-31 LAB — RPR: RPR Ser Ql: NONREACTIVE

## 2014-08-31 MED ORDER — LACTATED RINGERS IV SOLN
500.0000 mL | INTRAVENOUS | Status: DC | PRN
  Administered 2014-08-31: 500 mL via INTRAVENOUS

## 2014-08-31 MED ORDER — ACETAMINOPHEN 325 MG PO TABS: 650.0000 mg | ORAL_TABLET | ORAL | Status: DC | PRN

## 2014-08-31 MED ORDER — EPHEDRINE 5 MG/ML INJ
10.0000 mg | INTRAVENOUS | Status: DC | PRN
  Filled 2014-08-31: qty 2

## 2014-08-31 MED ORDER — BENZOCAINE-MENTHOL 20-0.5 % EX AERO
1.0000 "application " | INHALATION_SPRAY | CUTANEOUS | Status: DC | PRN
  Administered 2014-08-31: 1 via TOPICAL
  Filled 2014-08-31: qty 56

## 2014-08-31 MED ORDER — LIDOCAINE HCL (PF) 1 % IJ SOLN
INTRAMUSCULAR | Status: DC | PRN
  Administered 2014-08-31: 5 mL via EPIDURAL
  Administered 2014-08-31: 3 mL via EPIDURAL
  Administered 2014-08-31: 2 mL via EPIDURAL

## 2014-08-31 MED ORDER — OXYCODONE-ACETAMINOPHEN 5-325 MG PO TABS: 1.0000 | ORAL_TABLET | ORAL | Status: DC | PRN

## 2014-08-31 MED ORDER — WITCH HAZEL-GLYCERIN EX PADS: 1.0000 "application " | MEDICATED_PAD | CUTANEOUS | Status: DC | PRN

## 2014-08-31 MED ORDER — ONDANSETRON HCL 4 MG PO TABS: 4.0000 mg | ORAL_TABLET | ORAL | Status: DC | PRN

## 2014-08-31 MED ORDER — DIBUCAINE 1 % RE OINT: 1.0000 "application " | TOPICAL_OINTMENT | RECTAL | Status: DC | PRN

## 2014-08-31 MED ORDER — ONDANSETRON HCL 4 MG/2ML IJ SOLN: 4.0000 mg | Freq: Four times a day (QID) | INTRAMUSCULAR | Status: DC | PRN

## 2014-08-31 MED ORDER — DIPHENHYDRAMINE HCL 25 MG PO CAPS: 25.0000 mg | ORAL_CAPSULE | Freq: Four times a day (QID) | ORAL | Status: DC | PRN

## 2014-08-31 MED ORDER — ZOLPIDEM TARTRATE 5 MG PO TABS: 5.0000 mg | ORAL_TABLET | Freq: Every evening | ORAL | Status: DC | PRN

## 2014-08-31 MED ORDER — OXYCODONE-ACETAMINOPHEN 5-325 MG PO TABS: 2.0000 | ORAL_TABLET | ORAL | Status: DC | PRN

## 2014-08-31 MED ORDER — PENICILLIN G POTASSIUM 5000000 UNITS IJ SOLR
5.0000 10*6.[IU] | Freq: Once | INTRAMUSCULAR | Status: AC
  Administered 2014-08-31: 5 10*6.[IU] via INTRAVENOUS
  Filled 2014-08-31: qty 5

## 2014-08-31 MED ORDER — SIMETHICONE 80 MG PO CHEW: 80.0000 mg | CHEWABLE_TABLET | ORAL | Status: DC | PRN

## 2014-08-31 MED ORDER — LANOLIN HYDROUS EX OINT: TOPICAL_OINTMENT | CUTANEOUS | Status: DC | PRN

## 2014-08-31 MED ORDER — PHENYLEPHRINE 40 MCG/ML (10ML) SYRINGE FOR IV PUSH (FOR BLOOD PRESSURE SUPPORT)
80.0000 ug | PREFILLED_SYRINGE | INTRAVENOUS | Status: DC | PRN
  Filled 2014-08-31: qty 2
  Filled 2014-08-31: qty 20

## 2014-08-31 MED ORDER — CITRIC ACID-SODIUM CITRATE 334-500 MG/5ML PO SOLN: 30.0000 mL | ORAL | Status: DC | PRN

## 2014-08-31 MED ORDER — OXYTOCIN BOLUS FROM INFUSION
500.0000 mL | INTRAVENOUS | Status: DC
  Administered 2014-08-31: 500 mL via INTRAVENOUS

## 2014-08-31 MED ORDER — OXYTOCIN 40 UNITS IN LACTATED RINGERS INFUSION - SIMPLE MED
62.5000 mL/h | INTRAVENOUS | Status: DC
  Administered 2014-08-31: 62.5 mL/h via INTRAVENOUS
  Filled 2014-08-31: qty 1000

## 2014-08-31 MED ORDER — DIPHENHYDRAMINE HCL 50 MG/ML IJ SOLN: 12.5000 mg | INTRAMUSCULAR | Status: DC | PRN

## 2014-08-31 MED ORDER — IBUPROFEN 600 MG PO TABS
600.0000 mg | ORAL_TABLET | Freq: Four times a day (QID) | ORAL | Status: DC
  Administered 2014-08-31 – 2014-09-02 (×8): 600 mg via ORAL
  Filled 2014-08-31 (×8): qty 1

## 2014-08-31 MED ORDER — SENNOSIDES-DOCUSATE SODIUM 8.6-50 MG PO TABS
2.0000 | ORAL_TABLET | ORAL | Status: DC
  Administered 2014-09-01: 2 via ORAL
  Filled 2014-08-31 (×2): qty 2

## 2014-08-31 MED ORDER — TETANUS-DIPHTH-ACELL PERTUSSIS 5-2.5-18.5 LF-MCG/0.5 IM SUSP: 0.5000 mL | Freq: Once | INTRAMUSCULAR | Status: DC

## 2014-08-31 MED ORDER — LIDOCAINE HCL (PF) 1 % IJ SOLN
30.0000 mL | INTRAMUSCULAR | Status: DC | PRN
  Filled 2014-08-31: qty 30

## 2014-08-31 MED ORDER — PRENATAL MULTIVITAMIN CH
1.0000 | ORAL_TABLET | Freq: Every day | ORAL | Status: DC
Start: 2014-09-01 — End: 2014-09-02
  Administered 2014-09-01 – 2014-09-02 (×2): 1 via ORAL
  Filled 2014-08-31 (×2): qty 1

## 2014-08-31 MED ORDER — FENTANYL 2.5 MCG/ML BUPIVACAINE 1/10 % EPIDURAL INFUSION (WH - ANES)
14.0000 mL/h | INTRAMUSCULAR | Status: DC | PRN
  Administered 2014-08-31 (×2): 14 mL/h via EPIDURAL
  Filled 2014-08-31: qty 125

## 2014-08-31 MED ORDER — ONDANSETRON HCL 4 MG/2ML IJ SOLN: 4.0000 mg | INTRAMUSCULAR | Status: DC | PRN

## 2014-08-31 MED ORDER — PENICILLIN G POTASSIUM 5000000 UNITS IJ SOLR
2.5000 10*6.[IU] | INTRAVENOUS | Status: DC
  Administered 2014-08-31: 2.5 10*6.[IU] via INTRAVENOUS
  Filled 2014-08-31 (×5): qty 2.5

## 2014-08-31 MED ORDER — OXYCODONE-ACETAMINOPHEN 5-325 MG PO TABS
1.0000 | ORAL_TABLET | ORAL | Status: DC | PRN
  Administered 2014-09-01 – 2014-09-02 (×2): 1 via ORAL
  Filled 2014-08-31 (×2): qty 1

## 2014-08-31 MED ORDER — LACTATED RINGERS IV SOLN
INTRAVENOUS | Status: DC
  Administered 2014-08-31: 07:00:00 via INTRAVENOUS

## 2014-08-31 MED ORDER — ACETAMINOPHEN 325 MG PO TABS
650.0000 mg | ORAL_TABLET | ORAL | Status: DC | PRN
Start: 2014-08-31 — End: 2014-09-02

## 2014-08-31 NOTE — Lactation Note (Signed)
This note was copied from the chart of Darlene Livingston. Lactation Consultation Note  Mother attempting to latch baby on R breast after feeding for 10 min on L-side. Baby is tongue sucking.  Reviewed suck training.  Encouraged parents to try 5xday. Mother states she knows how to hand express and has viewed colostrum. Discussed basics, STS and cluster feeding. Mom encouraged to feed baby 8-12 times/24 hours and with feeding cues.  Mom made aware of O/P services, breastfeeding support groups, community resources, and our phone # for post-discharge questions.    Patient Name: Darlene Lileigh Fahringer [id number] Date: 08/31/2014 Reason for consult: Initial assessment   Maternal Data Has patient been taught Hand Expression?: Yes Does the patient have breastfeeding experience prior to this delivery?: Yes  Feeding Feeding Type: Breast Fed Length of feed: 10 min (entered at end of feeding)  LATCH Score/Interventions Latch: Repeated attempts needed to sustain latch, nipple held in mouth throughout feeding, stimulation needed to elicit sucking reflex. (baby tongue thrusts and sucks own tongue) Intervention(s): Adjust position;Assist with latch;Breast massage;Breast compression  Audible Swallowing: None Intervention(s): Hand expression;Skin to skin  Type of Nipple: Everted at rest and after stimulation  Comfort (Breast/Nipple): Soft / non-tender     Hold (Positioning): Assistance needed to correctly position infant at breast and maintain latch. Intervention(s): Breastfeeding basics reviewed;Support Pillows;Position options;Skin to skin  LATCH Score: 6  Lactation Tools Discussed/Used     Consult Status Consult Status: Follow-up Date: 09/01/14 Follow-up type: In-patient    Vivianne Master [facility] 08/31/2014, 10:53 PM

## 2014-08-31 NOTE — Anesthesia Procedure Notes (Signed)
Epidural Patient location during procedure: OB  Staffing Anesthesiologist: Catalina Gravel Performed by: anesthesiologist   Preanesthetic Checklist Completed: patient identified, pre-op evaluation, timeout performed, IV checked, risks and benefits discussed and monitors and equipment checked  Epidural Patient position: sitting Prep: DuraPrep Patient monitoring: blood pressure and continuous pulse ox Approach: midline Location: L3-L4 Injection technique: LOR air  Needle:  Needle type: Tuohy  Needle gauge: 17 G Needle length: 9 cm Needle insertion depth: 6 cm Catheter size: 19 Gauge Catheter at skin depth: 11 cm Test dose: negative and Other (1% Lidocaine)  Additional Notes Patient identified.  Risk benefits discussed including failed block, incomplete pain control, headache, nerve damage, paralysis, blood pressure changes, nausea, vomiting, reactions to medication both toxic or allergic, and postpartum back pain.  Patient expressed understanding and wished to proceed.  All questions were answered.  Sterile technique used throughout procedure and epidural site dressed with sterile barrier dressing. No paresthesia or other complications noted. The patient did not experience any signs of intravascular injection such as tinnitus or metallic taste in mouth nor signs of intrathecal spread such as rapid motor block. Please see nursing notes for vital signs.

## 2014-08-31 NOTE — H&P (Signed)
24 y.o. [redacted]w[redacted]d  [id number] comes in c/o painful ctx.  Otherwise has good fetal movement and no bleeding.  Past Medical History  Diagnosis Date  . Headache(784.0)   . Miscarriage     x2, one at 13weeks, one at [redacted] weeks gestation  . Chlamydia     Past Surgical History  Procedure Laterality Date  . No past surgeries      OB History  Gravida Para Term Preterm AB SAB TAB Ectopic Multiple Living  4 1 1  0 2 2 0 0 0 1    # Outcome Date GA Lbr Len/2nd Weight Sex Delivery Anes PTL Lv  4 Current           3 Term 02/22/12 [redacted]w[redacted]d 13:06 / 00:18 2.805 kg (6 lb 2.9 oz) F Vag-Spont EPI  Y     Comments: WNL  2 SAB           1 SAB               History   Social History  . Marital Status: Single    Spouse Name: N/A  . Number of Children: N/A  . Years of Education: N/A   Occupational History  . Not on file.   Social History Main Topics  . Smoking status: Never Smoker   . Smokeless tobacco: Never Used  . Alcohol Use: No     Comment: social  . Drug Use: No  . Sexual Activity: Yes    Birth Control/ Protection: None   Other Topics Concern  . Not on file   Social History Narrative   Fruit blend    Prenatal Transfer Tool  Maternal Diabetes: No Genetic Screening: Declined Maternal Ultrasounds/Referrals: Normal Fetal Ultrasounds or other Referrals:  None Maternal Substance Abuse:  No Significant Maternal Medications:  None Significant Maternal Lab Results: Lab values include: Group B Strep positive  Other PNC: uncomplicated.    Filed Vitals:   08/31/14 1300  BP: 120/82  Pulse: 95  Temp:   Resp: 20     Lungs/Cor:  NAD Abdomen:  soft, gravid Ex:  no cords, erythema SVE:  4.5/90/-2 FHTs:  120, good STV, NST R Toco:  q3-4   A/P   Admit in labor  GBS Pos- PCN  Routine L&D care  [place], Casimer Bilis

## 2014-08-31 NOTE — Progress Notes (Signed)
Dr Ouida Sills notified of pt's VE, contraction pattern, orders received

## 2014-08-31 NOTE — MAU Note (Signed)
Contractions every 5 mins. Denies LOF or vag bleeding. +FM. +GBS

## 2014-08-31 NOTE — Anesthesia Preprocedure Evaluation (Signed)
Anesthesia Evaluation  Patient identified by MRN, date of birth, ID band Patient awake    Reviewed: Allergy & Precautions, NPO status , Patient's Chart, lab work & pertinent test results  Airway Mallampati: II  TM Distance: >3 FB Neck ROM: Full    Dental  (+) Teeth Intact   Pulmonary neg pulmonary ROS,  breath sounds clear to auscultation  Pulmonary exam normal       Cardiovascular Exercise Tolerance: Good negative cardio ROS Normal cardiovascular examRhythm:Regular Rate:Normal     Neuro/Psych  Headaches, negative psych ROS   GI/Hepatic negative GI ROS, Neg liver ROS,   Endo/Other  negative endocrine ROS  Renal/GU negative Renal ROS     Musculoskeletal negative musculoskeletal ROS (+)   Abdominal   Peds  Hematology  (+) Blood dyscrasia, anemia ,   Anesthesia Other Findings   Reproductive/Obstetrics (+) Pregnancy                             Anesthesia Physical Anesthesia Plan  ASA: II  Anesthesia Plan: Epidural   Post-op Pain Management:    Induction:   Airway Management Planned:   Additional Equipment:   Intra-op Plan:   Post-operative Plan:   Informed Consent: I have reviewed the patients History and Physical, chart, labs and discussed the procedure including the risks, benefits and alternatives for the proposed anesthesia with the patient or authorized representative who has indicated his/her understanding and acceptance.   Dental advisory given  Plan Discussed with:   Anesthesia Plan Comments: (Patient identified. Risks/Benefits/Options discussed with patient including but not limited to bleeding, infection, nerve damage, paralysis, failed block, incomplete pain control, headache, blood pressure changes, nausea, vomiting, reactions to medication both or allergic, itching and postpartum back pain. Confirmed with bedside nurse the patient's most recent platelet count.  Confirmed with patient that they are not currently taking any anticoagulation, have any bleeding history or any family history of bleeding disorders. Patient expressed understanding and wished to proceed. All questions were answered. )        Anesthesia Quick Evaluation

## 2014-09-01 LAB — CBC
HCT: 26 % — ABNORMAL LOW (ref 36.0–46.0)
HEMOGLOBIN: 8.9 g/dL — AB (ref 12.0–15.0)
MCH: 31.1 pg (ref 26.0–34.0)
MCHC: 34.2 g/dL (ref 30.0–36.0)
MCV: 90.9 fL (ref 78.0–100.0)
Platelets: 226 10*3/uL (ref 150–400)
RBC: 2.86 MIL/uL — ABNORMAL LOW (ref 3.87–5.11)
RDW: 14 % (ref 11.5–15.5)
WBC: 5.2 10*3/uL (ref 4.0–10.5)

## 2014-09-01 NOTE — Addendum Note (Signed)
Addendum  created 09/01/14 1025 by Jonna Munro, CRNA   Modules edited: Charges VN, Notes Section   Notes Section:  File: [id number]

## 2014-09-01 NOTE — Anesthesia Postprocedure Evaluation (Signed)
  Anesthesia Post-op Note  Patient: Darlene Livingston  Procedure(s) Performed: * No procedures listed *  Patient Location: PACU and Mother/Baby  Anesthesia Type:Epidural  Level of Consciousness: awake, alert  and oriented  Airway and Oxygen Therapy: Patient Spontanous Breathing  Post-op Pain: none  Post-op Assessment: Post-op Vital signs reviewed, Patient's Cardiovascular Status Stable, No headache, No backache, No residual numbness and No residual motor weakness  Post-op Vital Signs: Reviewed and stable  Complications: No apparent anesthesia complications

## 2014-09-01 NOTE — Progress Notes (Signed)
Post Partum Day 1 Subjective: no complaints, up ad lib, voiding, tolerating PO and + flatus  Objective: Blood pressure 102/54, pulse 79, temperature 98.5 F (36.9 C), temperature source Oral, resp. rate 17, height 5\' 6"  (1.676 m), weight 89.812 kg (198 lb), last menstrual period 08/27/2013, SpO2 100 %, unknown if currently breastfeeding.  Physical Exam:  General: alert, cooperative and appears stated age Lochia: appropriate Uterine Fundus: firm DVT Evaluation: No evidence of DVT seen on physical exam.   Recent Labs  08/31/14 0725 09/01/14 0530  HGB 10.5* 8.9*  HCT 30.4* 26.0*    Assessment/Plan: Plan for discharge tomorrow  Routine PP care   LOS: 1 day   Naiara Lombardozzi H. 09/01/2014, 9:44 AM

## 2014-09-02 MED ORDER — IBUPROFEN 600 MG PO TABS: 600.0000 mg | ORAL_TABLET | Freq: Four times a day (QID) | ORAL | Status: DC | PRN

## 2014-09-02 MED ORDER — OXYCODONE-ACETAMINOPHEN 5-325 MG PO TABS: 1.0000 | ORAL_TABLET | ORAL | Status: DC | PRN

## 2014-09-02 NOTE — Discharge Summary (Signed)
Obstetric Discharge Summary Reason for Admission: onset of labor Prenatal Procedures: NST Intrapartum Procedures: spontaneous vaginal delivery Postpartum Procedures: none Complications-Operative and Postpartum: none HEMOGLOBIN  Date Value Ref Range Status  09/01/2014 8.9* 12.0 - 15.0 g/dL Final   HCT  Date Value Ref Range Status  09/01/2014 26.0* 36.0 - 46.0 % Final    Physical Exam:  General: alert, cooperative, appears stated age and no distress Lochia: appropriate Uterine Fundus: firm DVT Evaluation: No evidence of DVT seen on physical exam. Negative Homan's sign. No cords or calf tenderness.  Discharge Diagnoses: Term Pregnancy-delivered  Discharge Information: Date: 09/02/2014 Activity: pelvic rest Diet: routine Medications: PNV, Ibuprofen, Colace and Percocet Condition: stable Instructions: refer to practice specific booklet Discharge to: home   Newborn Data: Live born female  Birth Weight: 6 lb 9.3 oz (2985 g) APGAR: 9, 9  Home with mother.  Traeson Dusza, [place] 09/02/2014, 9:42 AM

## 2014-09-02 NOTE — Progress Notes (Signed)
Post Partum Day 2 Subjective: no complaints, up ad lib, voiding, tolerating PO, + flatus and breast feeding  Objective: Blood pressure 106/58, pulse 68, temperature 97.9 F (36.6 C), temperature source Oral, resp. rate 18, height 5\' 6"  (1.676 m), weight 89.812 kg (198 lb), last menstrual period 08/27/2013, SpO2 100 %, unknown if currently breastfeeding.  Physical Exam:  General: alert, cooperative, appears stated age and no distress Lochia: appropriate Uterine Fundus: firm DVT Evaluation: No evidence of DVT seen on physical exam. Negative Homan's sign. No cords or calf tenderness.   Recent Labs  08/31/14 0725 09/01/14 0530  HGB 10.5* 8.9*  HCT 30.4* 26.0*    Assessment/Plan: Discharge home, Breastfeeding and Contraception will discuss at post partum visit   LOS: 2 days   Benzie, [place] 09/02/2014, 9:40 AM

## 2014-09-02 NOTE — Lactation Note (Signed)
This note was copied from the chart of Darlene Livingston. Lactation Consultation Note; Mom has baby latched to the breast when I went into room. Mom reports baby has been latching well with no pain. Mature milk is beginning to come in. Reviewed engorgement prevention and treatment. Hs her own Medela pump. No questions at present. To call prn  Patient Name: Darlene Livingston [id number] Date: 09/02/2014 Reason for consult: Follow-up assessment   Maternal Data Formula Feeding for Exclusion: No Does the patient have breastfeeding experience prior to this delivery?: Yes  Feeding Feeding Type: Breast Fed Length of feed: 10 min  LATCH Score/Interventions Latch: Grasps breast easily, tongue down, lips flanged, rhythmical sucking.  Audible Swallowing: Spontaneous and intermittent  Type of Nipple: Everted at rest and after stimulation  Comfort (Breast/Nipple): Soft / non-tender  Problem noted: Mild/Moderate discomfort  Hold (Positioning): No assistance needed to correctly position infant at breast.  LATCH Score: 10  Lactation Tools Discussed/Used [facility] Program: No   Consult Status Consult Status: Complete    Truddie Crumble 09/02/2014, 10:34 AM

## 2014-10-11 DIAGNOSIS — S61412A Laceration without foreign body of left hand, initial encounter: Secondary | ICD-10-CM | POA: Diagnosis not present

## 2014-10-11 DIAGNOSIS — Z79899 Other long term (current) drug therapy: Secondary | ICD-10-CM | POA: Diagnosis not present

## 2014-10-11 DIAGNOSIS — S42401A Unspecified fracture of lower end of right humerus, initial encounter for closed fracture: Secondary | ICD-10-CM | POA: Diagnosis not present

## 2014-10-11 DIAGNOSIS — Y998 Other external cause status: Secondary | ICD-10-CM | POA: Insufficient documentation

## 2014-10-11 DIAGNOSIS — W108XXA Fall (on) (from) other stairs and steps, initial encounter: Secondary | ICD-10-CM | POA: Diagnosis not present

## 2014-10-11 DIAGNOSIS — Y92009 Unspecified place in unspecified non-institutional (private) residence as the place of occurrence of the external cause: Secondary | ICD-10-CM | POA: Insufficient documentation

## 2014-10-11 DIAGNOSIS — Y9389 Activity, other specified: Secondary | ICD-10-CM | POA: Insufficient documentation

## 2014-10-11 DIAGNOSIS — S59901A Unspecified injury of right elbow, initial encounter: Secondary | ICD-10-CM | POA: Diagnosis present

## 2014-10-11 DIAGNOSIS — Z8619 Personal history of other infectious and parasitic diseases: Secondary | ICD-10-CM | POA: Diagnosis not present

## 2014-10-12 ENCOUNTER — Encounter (HOSPITAL_COMMUNITY): Payer: Self-pay | Admitting: *Deleted

## 2014-10-12 ENCOUNTER — Emergency Department (HOSPITAL_COMMUNITY)
Admission: EM | Admit: 2014-10-12 | Discharge: 2014-10-12 | Disposition: A | Discharge status: Home / Self Care | Payer: 59 | Attending: Emergency Medicine | Admitting: Emergency Medicine

## 2014-10-12 ENCOUNTER — Emergency Department (HOSPITAL_COMMUNITY): Payer: 59

## 2014-10-12 DIAGNOSIS — S42401A Unspecified fracture of lower end of right humerus, initial encounter for closed fracture: Secondary | ICD-10-CM | POA: Diagnosis not present

## 2014-10-12 DIAGNOSIS — W19XXXA Unspecified fall, initial encounter: Secondary | ICD-10-CM

## 2014-10-12 DIAGNOSIS — S61412A Laceration without foreign body of left hand, initial encounter: Secondary | ICD-10-CM

## 2014-10-12 MED ORDER — BACITRACIN ZINC 500 UNIT/GM EX OINT
TOPICAL_OINTMENT | CUTANEOUS | Status: DC
Start: 2014-10-12 — End: 2014-10-12
  Filled 2014-10-12: qty 0.9

## 2014-10-12 MED ORDER — TETANUS-DIPHTH-ACELL PERTUSSIS 5-2.5-18.5 LF-MCG/0.5 IM SUSP: 0.5000 mL | Freq: Once | INTRAMUSCULAR | Status: DC

## 2014-10-12 MED ORDER — HYDROCODONE-ACETAMINOPHEN 5-325 MG PO TABS: 1.0000 | ORAL_TABLET | ORAL | Status: DC | PRN

## 2014-10-12 NOTE — Discharge Instructions (Signed)
Cast or Splint Care °Casts and splints support injured limbs and keep bones from moving while they heal. It is important to care for your cast or splint at home.   °HOME CARE INSTRUCTIONS °· Keep the cast or splint uncovered during the drying period. It can take 24 to 48 hours to dry if it is made of plaster. A fiberglass cast will dry in less than 1 hour. °· Do not rest the cast on anything harder than a pillow for the first 24 hours. °· Do not put weight on your injured limb or apply pressure to the cast until your health care provider gives you permission. °· Keep the cast or splint dry. Wet casts or splints can lose their shape and may not support the limb as well. A wet cast that has lost its shape can also create harmful pressure on your skin when it dries. Also, wet skin can become infected. °· Cover the cast or splint with a plastic bag when bathing or when out in the rain or snow. If the cast is on the trunk of the body, take sponge baths until the cast is removed. °· If your cast does become wet, dry it with a towel or a blow dryer on the cool setting only. °· Keep your cast or splint clean. Soiled casts may be wiped with a moistened cloth. °· Do not place any hard or soft foreign objects under your cast or splint, such as cotton, toilet paper, lotion, or powder. °· Do not try to scratch the skin under the cast with any object. The object could get stuck inside the cast. Also, scratching could lead to an infection. If itching is a problem, use a blow dryer on a cool setting to relieve discomfort. °· Do not trim or cut your cast or remove padding from inside of it. °· Exercise all joints next to the injury that are not immobilized by the cast or splint. For example, if you have a long leg cast, exercise the hip joint and toes. If you have an arm cast or splint, exercise the shoulder, elbow, thumb, and fingers. °· Elevate your injured arm or leg on 1 or 2 pillows for the first 1 to 3 days to decrease  swelling and pain. It is best if you can comfortably elevate your cast so it is higher than your heart. °SEEK MEDICAL CARE IF:  °· Your cast or splint cracks. °· Your cast or splint is too tight or too loose. °· You have unbearable itching inside the cast. °· Your cast becomes wet or develops a soft spot or area. °· You have a bad smell coming from inside your cast. °· You get an object stuck under your cast. °· Your skin around the cast becomes red or raw. °· You have new pain or worsening pain after the cast has been applied. °SEEK IMMEDIATE MEDICAL CARE IF:  °· You have fluid leaking through the cast. °· You are unable to move your fingers or toes. °· You have discolored (blue or white), cool, painful, or very swollen fingers or toes beyond the cast. °· You have tingling or numbness around the injured area. °· You have severe pain or pressure under the cast. °· You have any difficulty with your breathing or have shortness of breath. °· You have chest pain. °Document Released: 01/06/2000 Document Revised: 10/29/2012 Document Reviewed: 07/17/2012 °[employer]® Patient Information ©2015 [employer]. This information is not intended to replace advice given to you by your health care   provider. Make sure you discuss any questions you have with your health care provider. Radial Head Fracture A radial head fracture is a break of the smaller bone (radius) in the forearm. The head of this bone is the part near the elbow. These fractures commonly happen during a fall, when you land on an outstretched arm. These fractures are more common in middle aged adults and are common with a dislocation of the elbow. SYMPTOMS   Swelling of the elbow joint and pain on the outside of the elbow.  Pain and difficulty in bending or straightening the elbow.  Pain and difficulty in turning the palm of the hand up or down with the elbow bent. DIAGNOSIS  Your caregiver may make this diagnosis by a physical exam. X-rays can confirm the  type and amount of fracture. Sometimes a fracture that is not displaced cannot be seen on the original X-ray. TREATMENT  Radial head fractures are classified according to the amount of movement (displacement) of parts from the normal position.  Type 1 Fractures  Type 1 fractures are generally small fractures in which bone pieces remain together (nondisplaced fracture).  The fracture may not be seen on initial X-rays. Usually if X-rays are repeated two to three weeks later, the fracture will show up. A splint or sling is used for a few days. Gentle early motion is used to prevent the elbow from becoming stiff. It should not be done vigorously or forced as this could displace the bone pieces. Type 2 Fractures  With type 2 fractures, bone pieces are slightly displaced and larger pieces of bone are broken off.  If only a little displacement of the bone piece is present, splinting for 4 to 5 days usually works well. This is again followed with gentle active range of motion. Small fragments may be surgically removed.  Large pieces of bone that can be put back into place will sometimes be fixed with pins or screws to hold them until the bone is healed. If this cannot be done, the fragments are removed. For older, less active people, sometimes the entire radial head is removed if the wrist is not injured. The elbow and arm will still work fine. Soft tissue, tendon, and ligament injuries are corrected at the same time. Type 3 Fractures  Type 3 fractures have multiple broken pieces of bone that cannot be fixed. Surgery is usually needed to remove the broken bits of bone and what is left of the radial head. Soft-tissue damage is repaired. Gentle early motion is used to prevent the elbow from becoming stiff. Sometimes an artificial radial head can be used to prevent deformity if the elbow is unstable. Rest, ice, elevation, immobilization, medications, and pain control are used in the early care. HOME CARE  INSTRUCTIONS   Keep the injured part elevated while sitting or lying down. Keep the injury above the level of your heart (the center of the chest). This will decrease swelling and pain.  Apply ice to the injury for 15-20 minutes, 03-04 times per day while awake, for 2 days. Put the ice in a plastic bag and place a towel between the bag of ice and your cast or splint.  Move your fingers to avoid stiffness and minimize swelling.  If you have a plaster or fiberglass cast:  Do not try to scratch the skin under the cast using sharp or pointed objects.  Check the skin around the cast every day. You may put lotion on any red or sore  areas.  Keep your cast dry and clean.  If you have a plaster splint:  Wear the splint as directed.  You may loosen the elastic around the splint if your fingers become numb, tingle, or turn cold or blue.  Do not put pressure on any part of your cast or splint. It may break. Rest your cast only on a pillow for the first 24 hours until it is fully hardened.  Your cast or splint can be protected during bathing with a plastic bag. Do not lower the cast or splint into the water.  Only take over-the-counter or prescription medicines for pain, discomfort, or fever as directed by your caregiver.  Follow all instructions for follow-up with your caregiver. This includes any orthopedic referrals, physical therapy, and rehabilitation. Any delay in obtaining necessary care could result in a delay or failure of the bones to heal or permanent elbow stiffness.  Do not overdo exercises. This could further damage your injury. SEEK IMMEDIATE MEDICAL CARE IF:   Your cast or splint gets damaged or breaks.  You have more severe pain or swelling than you did before getting the cast.  You have severe pain when stretching your fingers.  There is a bad smell, new stains, and/or pus-like (purulent) drainage coming from under the cast.  Your fingers or hand turn pale or blue,  become cold, or you lose feeling. Document Released: 10/30/2005 Document Revised: 05/25/2013 Document Reviewed: 12/07/2008 [facility] Patient Information 2015 [place], [place]. This information is not intended to replace advice given to you by your health care provider. Make sure you discuss any questions you have with your health care provider. Non-Sutured Laceration A laceration is a cut or wound that goes through all layers of the skin and into the tissue just beneath the skin. Usually, these are stitched up or held together with tape or glue shortly after the injury occurred. However, if several or more hours have passed before getting care, too many germs (bacteria) get into the laceration. Stitching it closed would bring the risk of infection. If your health care provider feels your laceration is too old, it may be left open and then bandaged to allow healing from the bottom layer up. HOME CARE INSTRUCTIONS   Change the bandage (dressing) 2 times a day or as directed by your health care provider.  If the dressing or packing gauze sticks, soak it off with soapy water.  When you re-bandage your laceration, make sure that the dressing or packing gauze goes all the way to the bottom of the laceration. The top of the laceration is kept open so it can heal from the bottom up. There is less chance for infection with this method.  Wash the area with soap and water 2 times a day to remove all the creams or ointments, if used. Rinse off the soap. Pat the area dry with a clean towel. Look for signs of infection, such as redness, swelling, or a red line that goes away from the laceration.  Re-apply creams or ointments if they were used to bandage the laceration. This helps keep the bandage from sticking.  If the bandage becomes wet, dirty, or has a bad smell, change it as soon as possible.  Only take medicine as directed by your health care provider. You might need a tetanus shot now if:  You have no  idea when you had the last one.  You have never had a tetanus shot before.  Your laceration had dirt in it.  Your laceration was dirty, and your last tetanus shot was more than 7 years ago.  Your laceration was clean, and your last tetanus shot was more than 10 years ago. If you need a tetanus shot, and you decide not to get one, there is a rare chance of getting tetanus. Sickness from tetanus can be serious. If you got a tetanus shot, your arm may swell and get red and warm to the touch at the shot site. This is common and not a problem. SEEK MEDICAL CARE IF:   You have redness, swelling, or increasing pain in the laceration.  You notice a red line that goes away from your laceration.  You have pus coming from the laceration.  You have a fever.  You notice a bad smell coming from the laceration or dressing.  You notice something coming out of the laceration, such as wood or glass.  Your laceration is on your hand or foot and you are unable to properly move a finger or toe.  You have severe swelling around the laceration, causing pain and numbness.  You notice a change in color in your arm, hand, leg, or foot. MAKE SURE YOU:   Understand these instructions.  Will watch your condition.  Will get help right away if you are not doing well or get worse. Document Released: 12/06/2005 Document Revised: 01/13/2013 Document Reviewed: 06/28/2008 [facility] Patient Information 2015 [place], [place]. This information is not intended to replace advice given to you by your health care provider. Make sure you discuss any questions you have with your health care provider. Cryotherapy Cryotherapy means treatment with cold. Ice or gel packs can be used to reduce both pain and swelling. Ice is the most helpful within the first 24 to 48 hours after an injury or flare-up from overusing a muscle or joint. Sprains, strains, spasms, burning pain, shooting pain, and aches can all be eased with ice. Ice  can also be used when recovering from surgery. Ice is effective, has very few side effects, and is safe for most people to use. PRECAUTIONS  Ice is not a safe treatment option for people with:  Raynaud phenomenon. This is a condition affecting small blood vessels in the extremities. Exposure to cold may cause your problems to return.  Cold hypersensitivity. There are many forms of cold hypersensitivity, including:  Cold urticaria. Red, itchy hives appear on the skin when the tissues begin to warm after being iced.  Cold erythema. This is a red, itchy rash caused by exposure to cold.  Cold hemoglobinuria. Red blood cells break down when the tissues begin to warm after being iced. The hemoglobin that carry oxygen are passed into the urine because they cannot combine with blood proteins fast enough.  Numbness or altered sensitivity in the area being iced. If you have any of the following conditions, do not use ice until you have discussed cryotherapy with your caregiver:  Heart conditions, such as arrhythmia, angina, or chronic heart disease.  High blood pressure.  Healing wounds or open skin in the area being iced.  Current infections.  Rheumatoid arthritis.  Poor circulation.  Diabetes. Ice slows the blood flow in the region it is applied. This is beneficial when trying to stop inflamed tissues from spreading irritating chemicals to surrounding tissues. However, if you expose your skin to cold temperatures for too long or without the proper protection, you can damage your skin or nerves. Watch for signs of skin damage due to cold. HOME CARE INSTRUCTIONS  Follow these tips to use ice and cold packs safely.  Place a dry or damp towel between the ice and skin. A damp towel will cool the skin more quickly, so you may need to shorten the time that the ice is used.  For a more rapid response, add gentle compression to the ice.  Ice for no more than 10 to 20 minutes at a time. The bonier  the area you are icing, the less time it will take to get the benefits of ice.  Check your skin after 5 minutes to make sure there are no signs of a poor response to cold or skin damage.  Rest 20 minutes or more between uses.  Once your skin is numb, you can end your treatment. You can test numbness by very lightly touching your skin. The touch should be so light that you do not see the skin dimple from the pressure of your fingertip. When using ice, most people will feel these normal sensations in this order: cold, burning, aching, and numbness.  Do not use ice on someone who cannot communicate their responses to pain, such as small children or people with dementia. HOW TO MAKE AN ICE PACK Ice packs are the most common way to use ice therapy. Other methods include ice massage, ice baths, and cryosprays. Muscle creams that cause a cold, tingly feeling do not offer the same benefits that ice offers and should not be used as a substitute unless recommended by your caregiver. To make an ice pack, do one of the following:  Place crushed ice or a bag of frozen vegetables in a sealable plastic bag. Squeeze out the excess air. Place this bag inside another plastic bag. Slide the bag into a pillowcase or place a damp towel between your skin and the bag.  Mix 3 parts water with 1 part rubbing alcohol. Freeze the mixture in a sealable plastic bag. When you remove the mixture from the freezer, it will be slushy. Squeeze out the excess air. Place this bag inside another plastic bag. Slide the bag into a pillowcase or place a damp towel between your skin and the bag. SEEK MEDICAL CARE IF:  You develop white spots on your skin. This may give the skin a blotchy (mottled) appearance.  Your skin turns blue or pale.  Your skin becomes waxy or hard.  Your swelling gets worse. MAKE SURE YOU:   Understand these instructions.  Will watch your condition.  Will get help right away if you are not doing well or  get worse. Document Released: 09/04/2010 Document Revised: 05/25/2013 Document Reviewed: 09/04/2010 [facility] Patient Information 2015 [place], [place]. This information is not intended to replace advice given to you by your health care provider. Make sure you discuss any questions you have with your health care provider.

## 2014-10-12 NOTE — Progress Notes (Signed)
Orthopedic Tech Progress Note Patient Details:  Darlene Livingston 10-Dec-1990 [id number]  Ortho Devices Type of Ortho Device: Ace wrap, Arm sling, Post (long arm) splint Ortho Device/Splint Location: RUE Ortho Device/Splint Interventions: Application   Asia R Thompson 10/12/2014, 3:35 AM

## 2014-10-12 NOTE — ED Provider Notes (Signed)
CSN: [id number]     Arrival date & time 10/11/14  2357 History   First MD Initiated Contact with Patient 10/12/14 0024     No chief complaint on file.    (Consider location/radiation/quality/duration/timing/severity/associated sxs/prior Treatment) Patient is a 24 y.o. female presenting with fall. The history is provided by the patient. No language interpreter was used.  Fall This is a new problem. The current episode started today. Pertinent negatives include no abdominal pain, chest pain, chills, fever, headaches, nausea or neck pain. Associated symptoms comments: She presents with right elbow, wrist and left hand pain/laceration after fall down a flight of stairs at home. No LOC, nausea, head injury, chest or abdominal pain/injury..    Past Medical History  Diagnosis Date  . Headache(784.0)   . Miscarriage     x2, one at 13weeks, one at [redacted] weeks gestation  . Chlamydia    Past Surgical History  Procedure Laterality Date  . No past surgeries     Family History  Problem Relation Age of Onset  . Hypertension Mother   . Depression Maternal Aunt   . Diabetes Maternal Aunt   . Depression Maternal Uncle   . Diabetes Maternal Uncle   . Asthma Paternal Aunt   . Cancer Paternal Aunt   . Depression Paternal Aunt   . Diabetes Paternal Aunt   . Asthma Paternal Uncle   . Depression Paternal Uncle   . Diabetes Paternal Uncle   . Birth defects Cousin   . Anesthesia problems Neg Hx   . Hypotension Neg Hx   . Malignant hyperthermia Neg Hx   . Pseudochol deficiency Neg Hx    Social History  Substance Use Topics  . Smoking status: Never Smoker   . Smokeless tobacco: Never Used  . Alcohol Use: No     Comment: social   OB History    Gravida Para Term Preterm AB TAB SAB Ectopic Multiple Living   4 2 2  0 2 0 2 0 0 2     Review of Systems  Constitutional: Negative for fever and chills.  Eyes: Negative for pain and visual disturbance.  Respiratory: Negative.  Negative for shortness  of breath.   Cardiovascular: Negative.  Negative for chest pain.  Gastrointestinal: Negative.  Negative for nausea and abdominal pain.  Musculoskeletal: Negative for neck pain.       See HPI.  Skin: Positive for wound.  Neurological: Negative.  Negative for syncope, light-headedness and headaches.      Allergies  Fruit blend  Home Medications   Prior to Admission medications   Medication Sig Start Date End Date Taking? Authorizing Shannel Zahm  ibuprofen (ADVIL,MOTRIN) 600 MG tablet Take 1 tablet (600 mg total) by mouth every 6 (six) hours as needed for cramping. 09/02/14   Sanjuana Kava, MD  oxyCODONE-acetaminophen (PERCOCET/ROXICET) 5-325 MG per tablet Take 1-2 tablets by mouth every 4 (four) hours as needed for severe pain (for pain scale 4-7). 09/02/14   Sanjuana Kava, MD  Prenatal Vit-Fe Fumarate-FA (PRENATAL MULTIVITAMIN) TABS tablet Take 1 tablet by mouth daily at 12 noon.    Historical Kaniah Rizzolo, MD   BP 120/91 mmHg  Pulse 82  Temp(Src) 98.3 F (36.8 C) (Oral)  Resp 18  SpO2 99% Physical Exam  Constitutional: She is oriented to person, place, and time. She appears well-developed and well-nourished.  HENT:  Head: Normocephalic.  Neck: Normal range of motion. Neck supple.  Cardiovascular: Normal rate.   Pulmonary/Chest: Effort normal. She exhibits no tenderness.  Abdominal:  Soft. Bowel sounds are normal. There is no tenderness. There is no rebound and no guarding.  Musculoskeletal: Normal range of motion.  Painful ROM of right elbow. No deformity or significant swelling. Right wrist minimally tender with full pain free ROM. Left hand has no bony tenderness, specifically non-tender relative to laceration along ulnar aspect.   Neurological: She is alert and oriented to person, place, and time.  Skin: Skin is warm and dry. No rash noted.  3 cm flap laceration ulnar left hand.  Psychiatric: She has a normal mood and affect.    ED Course  Procedures (including critical care  time) Labs Review Labs Reviewed - No data to display  Imaging Review No results found. I have personally reviewed and evaluated these images and lab results as part of my medical decision-making.   EKG Interpretation None     LACERATION REPAIR Performed by: Charlann Lange A Authorized by: Charlann Lange A Consent: Verbal consent obtained. Risks and benefits: risks, benefits and alternatives were discussed Consent given by: patient Patient identity confirmed: provided demographic data Prepped and Draped in normal sterile fashion Wound explored  Laceration Location: left hand  Laceration Length: 3cm  No Foreign Bodies seen or palpated  Anesthesia: local infiltration  Local anesthetic: lidocaine 1% w/o epinephrine  Anesthetic total: 2 ml  Irrigation method: syringe Amount of cleaning: standard  Skin closure: 4-0 prolene  Number of sutures: 4  Technique: simple interrupted  Patient tolerance: Patient tolerated the procedure well with no immediate complications.  MDM   Final diagnoses:  None    1. Left hand laceration 2. Fall.  3. Right elbow fracture  Laceration repaired as above note. Uncomplicated  Posterior splint applied to right elbow fracture. Orthopedic referral provided. Will provide pain medication. Tetanus updated.    Charlann Lange, PA-C 10/12/14 1840  Jola Schmidt, MD 10/12/14 [phone]

## 2014-10-12 NOTE — ED Notes (Signed)
Pt states she fell down an entire flight of  Stairs at home they were carpeted,  Pt thinks she sprang her right arm and has a cut on her left hand where it hit a vase at bottom of stairs, says she was hurrying because she has a 30 week old and 22 year old,  Denies LOC,  Pt is alert and oriented,  Bleeding is controlled on left hand right wrist is swollen and in sling

## 2014-10-12 NOTE — ED Notes (Signed)
Awaiting orthopaedic for posterior splint,  Shari PA called and spoke with ortho,  Pt is aware and is resting comfortably with warm blankets and dimmed lights,  Call bell at bedside

## 2014-10-12 NOTE — ED Notes (Signed)
Bacitracin ointment and bandage applied

## 2015-10-26 ENCOUNTER — Emergency Department (HOSPITAL_COMMUNITY): Payer: Medicaid Other

## 2015-10-26 ENCOUNTER — Encounter (HOSPITAL_COMMUNITY): Payer: Self-pay | Admitting: *Deleted

## 2015-10-26 ENCOUNTER — Emergency Department (HOSPITAL_COMMUNITY)
Admission: EM | Admit: 2015-10-26 | Discharge: 2015-10-26 | Disposition: A | Discharge status: Home / Self Care | Payer: Medicaid Other | Attending: Emergency Medicine | Admitting: Emergency Medicine

## 2015-10-26 DIAGNOSIS — R52 Pain, unspecified: Secondary | ICD-10-CM

## 2015-10-26 DIAGNOSIS — R1032 Left lower quadrant pain: Secondary | ICD-10-CM | POA: Insufficient documentation

## 2015-10-26 DIAGNOSIS — R102 Pelvic and perineal pain: Secondary | ICD-10-CM | POA: Diagnosis not present

## 2015-10-26 LAB — POC URINE PREG, ED: Preg Test, Ur: NEGATIVE

## 2015-10-26 MED ORDER — MELOXICAM 7.5 MG PO TABS
7.5000 mg | ORAL_TABLET | ORAL | Status: AC
  Administered 2015-10-26: 7.5 mg via ORAL
  Filled 2015-10-26: qty 1

## 2015-10-26 MED ORDER — MELOXICAM 7.5 MG PO TABS: 7.5000 mg | ORAL_TABLET | Freq: Every day | ORAL | 0 refills | Status: DC | PRN

## 2015-10-26 NOTE — ED Notes (Signed)
Pt in US

## 2015-10-26 NOTE — ED Triage Notes (Signed)
Pt reports persistent pain to left groin for over a month, radiates down left leg. Denies urinary or vaginal symptoms.

## 2015-10-26 NOTE — Discharge Instructions (Signed)

## 2015-10-26 NOTE — ED Notes (Signed)
Pt ambulated to the restroom with ease. No complaints

## 2015-10-26 NOTE — ED Provider Notes (Signed)
[facility] DEPT Provider Note   CSN: [id number] Arrival date & time: 10/26/15  1045     History   Chief Complaint Chief Complaint  Patient presents with  . Groin Pain  . Leg Pain    HPI Darlene Livingston is a 25 y.o. female.  HPI   Patient presents with left pelvic pain with radiation down the front of her left leg to the level of her knee.  The pain was initially related to her periods, starting the week before and ending the week after, but has now become constant since her last period 2 weeks ago.  Pt has Mirena in place x 1 year.  Pt has tried otc medications and muscle relaxers without improvement.  Pain is currently 8/10 intensity.  Denies fevers, N/V, urinary, vaginal, bowel changes.    Past Medical History:  Diagnosis Date  . Chlamydia   . Headache(784.0)   . Miscarriage    x2, one at 13weeks, one at [redacted] weeks gestation    Patient Active Problem List   Diagnosis Date Noted  . Labor and delivery indication for care or intervention 08/31/2014  . Decreased fetal movement determined by examination   . [redacted] weeks gestation of pregnancy   . Traumatic injury during pregnancy   . [redacted] weeks gestation of pregnancy   . Vaginal lesion 02/16/2012    Past Surgical History:  Procedure Laterality Date  . NO PAST SURGERIES      OB History    Gravida Para Term Preterm AB Living   4 2 2  0 2 2   SAB TAB Ectopic Multiple Live Births   2 0 0 0 2       Home Medications    Prior to Admission medications   Medication Sig Start Date End Date Taking? Authorizing Provider  HYDROcodone-acetaminophen (NORCO/VICODIN) 5-325 MG per tablet Take 1-2 tablets by mouth every 4 (four) hours as needed. 10/12/14   Charlann Lange, PA-C  meloxicam (MOBIC) 7.5 MG tablet Take 1-2 tablets (7.5-15 mg total) by mouth daily as needed for pain. 10/26/15   Clayton Bibles, PA-C  oxyCODONE-acetaminophen (PERCOCET/ROXICET) 5-325 MG per tablet Take 1-2 tablets by mouth every 4 (four) hours as needed for severe  pain (for pain scale 4-7). 09/02/14   Sanjuana Kava, MD  Prenatal Vit-Fe Fumarate-FA (PRENATAL MULTIVITAMIN) TABS tablet Take 1 tablet by mouth daily at 12 noon.    Historical Provider, MD    Family History Family History  Problem Relation Age of Onset  . Hypertension Mother   . Depression Maternal Aunt   . Diabetes Maternal Aunt   . Depression Maternal Uncle   . Diabetes Maternal Uncle   . Asthma Paternal Aunt   . Cancer Paternal Aunt   . Depression Paternal Aunt   . Diabetes Paternal Aunt   . Asthma Paternal Uncle   . Depression Paternal Uncle   . Diabetes Paternal Uncle   . Birth defects Cousin   . Anesthesia problems Neg Hx   . Hypotension Neg Hx   . Malignant hyperthermia Neg Hx   . Pseudochol deficiency Neg Hx     Social History Social History  Substance Use Topics  . Smoking status: Never Smoker  . Smokeless tobacco: Never Used  . Alcohol use No     Comment: social     Allergies   Fruit blend   Review of Systems Review of Systems  All other systems reviewed and are negative.    Physical Exam Updated Vital Signs BP  117/71 (BP Location: Left Arm)   Pulse (!) 53   Temp 98.5 F (36.9 C) (Oral)   Resp 18   Ht 5\' 7"  (1.702 m)   Wt 76.2 kg   LMP 10/12/2015   SpO2 100%   BMI 26.31 kg/m   Physical Exam  Constitutional: She appears well-developed and well-nourished. No distress.  HENT:  Head: Normocephalic and atraumatic.  Neck: Neck supple.  Cardiovascular: Normal rate and regular rhythm.   Pulmonary/Chest: Effort normal and breath sounds normal. No respiratory distress. She has no wheezes. She has no rales.  Abdominal: Soft. She exhibits no distension and no mass. There is tenderness. There is no rebound and no guarding. No hernia.    Tenderness within left pelvis and also along left anterior hip musculature.    Musculoskeletal:  Left leg without edema.  No erythema or warmth.  Distal pulses and sensation intact.    LEFT: Pain with hip flexion  against resistance.  Left hip without erythema, warmth, or pain with passive ROM.    Neurological: She is alert.  Skin: She is not diaphoretic.  Nursing note and vitals reviewed.    ED Treatments / Results  Labs (all labs ordered are listed, but only abnormal results are displayed) Labs Reviewed  POC URINE PREG, ED    EKG  EKG Interpretation None       Radiology US Transvaginal Non-ob  Result Date: 10/26/2015 CLINICAL DATA:  Left pelvic pain. EXAM: TRANSABDOMINAL AND TRANSVAGINAL ULTRASOUND OF PELVIS DOPPLER ULTRASOUND OF OVARIES TECHNIQUE: Both transabdominal and transvaginal ultrasound examinations of the pelvis were performed. Transabdominal technique was performed for global imaging of the pelvis including uterus, ovaries, adnexal regions, and pelvic cul-de-sac. It was necessary to proceed with endovaginal exam following the transabdominal exam to visualize the ovaries. Color and duplex Doppler ultrasound was utilized to evaluate blood flow to the ovaries. COMPARISON:  None. FINDINGS: Uterus Measurements: 9.8 x 5.8 x 4.9 cm. 2.2 cm fibroid is seen anteriorly. Endometrium Thickness: 6.6 mm which is within normal limits. Intrauterine device is noted. Right ovary Measurements: 4.1 x 3.5 x 2.6 cm. 3.6 cm simple cyst is noted. Left ovary Measurements: 3.1 x 1.7 x 1.6 cm. Normal appearance/no adnexal mass. Pulsed Doppler evaluation of both ovaries demonstrates normal low-resistance arterial and venous waveforms. Other findings No abnormal free fluid. IMPRESSION: 2.2 cm uterine fibroid. 3.6 cm simple right ovarian cyst. No other abnormality seen in the pelvis. Electronically Signed   By: Marijo Conception, M.D.   On: 10/26/2015 15:29   US Pelvis Complete  Result Date: 10/26/2015 CLINICAL DATA:  Left pelvic pain. EXAM: TRANSABDOMINAL AND TRANSVAGINAL ULTRASOUND OF PELVIS DOPPLER ULTRASOUND OF OVARIES TECHNIQUE: Both transabdominal and transvaginal ultrasound examinations of the pelvis were  performed. Transabdominal technique was performed for global imaging of the pelvis including uterus, ovaries, adnexal regions, and pelvic cul-de-sac. It was necessary to proceed with endovaginal exam following the transabdominal exam to visualize the ovaries. Color and duplex Doppler ultrasound was utilized to evaluate blood flow to the ovaries. COMPARISON:  None. FINDINGS: Uterus Measurements: 9.8 x 5.8 x 4.9 cm. 2.2 cm fibroid is seen anteriorly. Endometrium Thickness: 6.6 mm which is within normal limits. Intrauterine device is noted. Right ovary Measurements: 4.1 x 3.5 x 2.6 cm. 3.6 cm simple cyst is noted. Left ovary Measurements: 3.1 x 1.7 x 1.6 cm. Normal appearance/no adnexal mass. Pulsed Doppler evaluation of both ovaries demonstrates normal low-resistance arterial and venous waveforms. Other findings No abnormal free fluid. IMPRESSION: 2.2 cm  uterine fibroid. 3.6 cm simple right ovarian cyst. No other abnormality seen in the pelvis. Electronically Signed   By: Marijo Conception, M.D.   On: 10/26/2015 15:29   [place] Art/ven Flow Abd Pelv Doppler  Result Date: 10/26/2015 CLINICAL DATA:  Left pelvic pain. EXAM: TRANSABDOMINAL AND TRANSVAGINAL ULTRASOUND OF PELVIS DOPPLER ULTRASOUND OF OVARIES TECHNIQUE: Both transabdominal and transvaginal ultrasound examinations of the pelvis were performed. Transabdominal technique was performed for global imaging of the pelvis including uterus, ovaries, adnexal regions, and pelvic cul-de-sac. It was necessary to proceed with endovaginal exam following the transabdominal exam to visualize the ovaries. Color and duplex Doppler ultrasound was utilized to evaluate blood flow to the ovaries. COMPARISON:  None. FINDINGS: Uterus Measurements: 9.8 x 5.8 x 4.9 cm. 2.2 cm fibroid is seen anteriorly. Endometrium Thickness: 6.6 mm which is within normal limits. Intrauterine device is noted. Right ovary Measurements: 4.1 x 3.5 x 2.6 cm. 3.6 cm simple cyst is noted. Left ovary  Measurements: 3.1 x 1.7 x 1.6 cm. Normal appearance/no adnexal mass. Pulsed Doppler evaluation of both ovaries demonstrates normal low-resistance arterial and venous waveforms. Other findings No abnormal free fluid. IMPRESSION: 2.2 cm uterine fibroid. 3.6 cm simple right ovarian cyst. No other abnormality seen in the pelvis. Electronically Signed   By: Marijo Conception, M.D.   On: 10/26/2015 15:29    Procedures Procedures (including critical care time)  Medications Ordered in ED Medications  meloxicam (MOBIC) tablet 7.5 mg (7.5 mg Oral Given 10/26/15 1416)     Initial Impression / Assessment and Plan / ED Course  I have reviewed the triage vital signs and the nursing notes.  Pertinent labs & imaging results that were available during my care of the patient were reviewed by me and considered in my medical decision making (see chart for details).  Clinical Course   Afebrile, nontoxic patient with right pelvic and right anterior hip pain, initially thought to be related to periods and now constant.  Pregnancy test is negative.  US unremarkable.   No e/o hernia. No GU, GI complaints.  Left pelvis pain vs muscular or hip flexor strain?   D/C home with mobic, PCP resources for follow up.  Pt getting insurance this week, will be able to see PCP soon.  Discussed result, findings, treatment, and follow up  with patient.  Pt given return precautions.  Pt verbalizes understanding and agrees with plan.       Final Clinical Impressions(s) / ED Diagnoses   Final diagnoses:  Pelvic pain  Pain  Left groin pain    New Prescriptions New Prescriptions   MELOXICAM (MOBIC) 7.5 MG TABLET    Take 1-2 tablets (7.5-15 mg total) by mouth daily as needed for pain.     [place], PA-C 10/26/15 [place], MD 10/27/15 [phone]

## 2015-10-26 NOTE — ED Notes (Signed)
Called and spoke with ultra sound, and they said she's the next one that they're going to send for.

## 2016-06-04 DIAGNOSIS — R102 Pelvic and perineal pain: Secondary | ICD-10-CM

## 2016-06-04 DIAGNOSIS — Z124 Encounter for screening for malignant neoplasm of cervix: Secondary | ICD-10-CM | POA: Diagnosis not present

## 2016-06-04 DIAGNOSIS — Z01419 Encounter for gynecological examination (general) (routine) without abnormal findings: Secondary | ICD-10-CM | POA: Diagnosis not present

## 2016-06-04 HISTORY — DX: Pelvic and perineal pain: R10.2

## 2016-06-08 DIAGNOSIS — Z23 Encounter for immunization: Secondary | ICD-10-CM | POA: Diagnosis not present

## 2016-08-10 DIAGNOSIS — Z23 Encounter for immunization: Secondary | ICD-10-CM | POA: Diagnosis not present

## 2016-11-24 ENCOUNTER — Encounter (HOSPITAL_COMMUNITY): Payer: Self-pay | Admitting: *Deleted

## 2016-11-24 ENCOUNTER — Inpatient Hospital Stay (HOSPITAL_COMMUNITY)
Admission: AD | Admit: 2016-11-24 | Discharge: 2016-11-24 | Disposition: A | Discharge status: Home / Self Care | Payer: Medicaid Other | Source: Ambulatory Visit | Attending: Obstetrics and Gynecology | Admitting: Obstetrics and Gynecology

## 2016-11-24 DIAGNOSIS — R1030 Lower abdominal pain, unspecified: Secondary | ICD-10-CM

## 2016-11-24 DIAGNOSIS — R109 Unspecified abdominal pain: Secondary | ICD-10-CM | POA: Insufficient documentation

## 2016-11-24 DIAGNOSIS — N898 Other specified noninflammatory disorders of vagina: Secondary | ICD-10-CM

## 2016-11-24 LAB — URINALYSIS, ROUTINE W REFLEX MICROSCOPIC
Bilirubin Urine: NEGATIVE
Glucose, UA: NEGATIVE mg/dL
Hgb urine dipstick: NEGATIVE
KETONES UR: NEGATIVE mg/dL
LEUKOCYTES UA: NEGATIVE
Nitrite: NEGATIVE
PROTEIN: NEGATIVE mg/dL
Specific Gravity, Urine: 1.02 (ref 1.005–1.030)
pH: 5 (ref 5.0–8.0)

## 2016-11-24 LAB — POCT PREGNANCY, URINE: PREG TEST UR: NEGATIVE

## 2016-11-24 LAB — WET PREP, GENITAL
Sperm: NONE SEEN
Trich, Wet Prep: NONE SEEN
YEAST WET PREP: NONE SEEN

## 2016-11-24 MED ORDER — METRONIDAZOLE 500 MG PO TABS: 500.0000 mg | ORAL_TABLET | Freq: Two times a day (BID) | ORAL | 0 refills | Status: DC

## 2016-11-24 NOTE — MAU Note (Addendum)
Abominal pain for 2-3 days with vag d/c. Thick white vag d/c with odor. Has IUD for 2.12yrs

## 2016-11-24 NOTE — Progress Notes (Signed)
Written and verbal d/c instructions given and understanding voiced. 

## 2016-11-24 NOTE — Discharge Instructions (Signed)

## 2016-11-24 NOTE — MAU Provider Note (Signed)
History   26 yo BF in with c/o yellowish vag discharge and abd pain x 2 days. Discharge has a odor. IUD as contraceptive.  CSN: [id number]  Arrival date & time 11/24/16  1947   None     Chief Complaint  Patient presents with  . Abdominal Pain    HPI  Past Medical History:  Diagnosis Date  . Chlamydia   . Headache(784.0)   . Miscarriage    x2, one at 13weeks, one at [redacted] weeks gestation    Past Surgical History:  Procedure Laterality Date  . NO PAST SURGERIES      Family History  Problem Relation Age of Onset  . Hypertension Mother   . Depression Maternal Aunt   . Diabetes Maternal Aunt   . Depression Maternal Uncle   . Diabetes Maternal Uncle   . Asthma Paternal Aunt   . Cancer Paternal Aunt   . Depression Paternal Aunt   . Diabetes Paternal Aunt   . Asthma Paternal Uncle   . Depression Paternal Uncle   . Diabetes Paternal Uncle   . Birth defects Cousin   . Anesthesia problems Neg Hx   . Hypotension Neg Hx   . Malignant hyperthermia Neg Hx   . Pseudochol deficiency Neg Hx     Social History  Substance Use Topics  . Smoking status: Never Smoker  . Smokeless tobacco: Never Used  . Alcohol use No     Comment: social    OB History    Gravida Para Term Preterm AB Living   4 2 2  0 2 2   SAB TAB Ectopic Multiple Live Births   2 0 0 0 2      Review of Systems  Constitutional: Negative.   HENT: Negative.   Eyes: Negative.   Respiratory: Negative.   Cardiovascular: Negative.   Gastrointestinal: Positive for abdominal pain.  Endocrine: Negative.   Genitourinary: Positive for vaginal discharge.  Musculoskeletal: Negative.   Skin: Negative.   Allergic/Immunologic: Negative.   Neurological: Negative.   Hematological: Negative.   Psychiatric/Behavioral: Negative.     Allergies  Fruit blend  Home Medications    BP 123/73 (BP Location: Right Arm)   Pulse 68   Temp 98.2 F (36.8 C)   Resp 16   Ht 5' 6.5" (1.689 m)   Wt 156 lb (70.8 kg)   LMP  11/12/2016   BMI 24.80 kg/m   Physical Exam  Constitutional: She is oriented to person, place, and time. She appears well-developed and well-nourished.  HENT:  Head: Normocephalic.  Eyes: Pupils are equal, round, and reactive to light.  Neck: Normal range of motion.  Cardiovascular: Normal rate, regular rhythm, normal heart sounds and intact distal pulses.   Pulmonary/Chest: Effort normal and breath sounds normal.  Abdominal: Soft. Bowel sounds are normal.  Genitourinary: Vaginal discharge found.  Musculoskeletal: Normal range of motion.  Neurological: She is alert and oriented to person, place, and time. She has normal reflexes.  Skin: Skin is warm and dry.  Psychiatric: She has a normal mood and affect. Her behavior is normal. Judgment and thought content normal.    MAU Course  Procedures (including critical care time)  Labs Reviewed  WET PREP, GENITAL  URINALYSIS, ROUTINE W REFLEX MICROSCOPIC  POCT PREGNANCY, URINE  GC/CHLAMYDIA PROBE AMP ([place]) NOT AT [facility]   No results found.   No diagnosis found.    MDM  VSS, spec exam done wet prep pos clue. GC and chla obtained. yellowish  discharge noted. Cervix smooth pink, no lesions noted, IUD string present. No CMT. POC discussed with Dr. Rogue Bussing

## 2016-11-26 LAB — GC/CHLAMYDIA PROBE AMP (~~LOC~~) NOT AT ARMC
Chlamydia: POSITIVE — AB
NEISSERIA GONORRHEA: NEGATIVE

## 2017-05-15 ENCOUNTER — Inpatient Hospital Stay (HOSPITAL_COMMUNITY)
Admission: AD | Admit: 2017-05-15 | Discharge: 2017-05-15 | Discharge status: Against Medical Advice | Payer: BLUE CROSS/BLUE SHIELD | Source: Ambulatory Visit | Attending: Obstetrics and Gynecology | Admitting: Obstetrics and Gynecology

## 2017-05-15 NOTE — MAU Note (Signed)
Called from lobby, no answer 

## 2017-05-16 ENCOUNTER — Other Ambulatory Visit: Payer: Self-pay

## 2017-05-16 ENCOUNTER — Ambulatory Visit (HOSPITAL_COMMUNITY)
Admission: EM | Admit: 2017-05-16 | Discharge: 2017-05-16 | Disposition: A | Discharge status: Home / Self Care | Payer: BLUE CROSS/BLUE SHIELD | Attending: Internal Medicine | Admitting: Internal Medicine

## 2017-05-16 ENCOUNTER — Encounter (HOSPITAL_COMMUNITY): Payer: Self-pay | Admitting: Emergency Medicine

## 2017-05-16 DIAGNOSIS — Z113 Encounter for screening for infections with a predominantly sexual mode of transmission: Secondary | ICD-10-CM

## 2017-05-16 DIAGNOSIS — N898 Other specified noninflammatory disorders of vagina: Secondary | ICD-10-CM | POA: Diagnosis not present

## 2017-05-16 DIAGNOSIS — Z3202 Encounter for pregnancy test, result negative: Secondary | ICD-10-CM | POA: Diagnosis not present

## 2017-05-16 DIAGNOSIS — Z975 Presence of (intrauterine) contraceptive device: Secondary | ICD-10-CM | POA: Insufficient documentation

## 2017-05-16 DIAGNOSIS — R300 Dysuria: Secondary | ICD-10-CM | POA: Diagnosis not present

## 2017-05-16 DIAGNOSIS — Z202 Contact with and (suspected) exposure to infections with a predominantly sexual mode of transmission: Secondary | ICD-10-CM

## 2017-05-16 DIAGNOSIS — R109 Unspecified abdominal pain: Secondary | ICD-10-CM

## 2017-05-16 LAB — POCT URINALYSIS DIP (DEVICE)
Glucose, UA: NEGATIVE mg/dL
Hgb urine dipstick: NEGATIVE
Ketones, ur: 15 mg/dL — AB
Nitrite: NEGATIVE
PROTEIN: NEGATIVE mg/dL
SPECIFIC GRAVITY, URINE: 1.025 (ref 1.005–1.030)
UROBILINOGEN UA: 2 mg/dL — AB (ref 0.0–1.0)
pH: 7 (ref 5.0–8.0)

## 2017-05-16 LAB — POCT PREGNANCY, URINE: Preg Test, Ur: NEGATIVE

## 2017-05-16 MED ORDER — METRONIDAZOLE 500 MG PO TABS: 500.0000 mg | ORAL_TABLET | Freq: Two times a day (BID) | ORAL | 0 refills | Status: DC

## 2017-05-16 MED ORDER — AZITHROMYCIN 250 MG PO TABS
ORAL_TABLET | ORAL | Status: AC
  Filled 2017-05-16: qty 4

## 2017-05-16 MED ORDER — CEFTRIAXONE SODIUM 250 MG IJ SOLR
250.0000 mg | Freq: Once | INTRAMUSCULAR | Status: AC
  Administered 2017-05-16: 250 mg via INTRAMUSCULAR

## 2017-05-16 MED ORDER — AZITHROMYCIN 250 MG PO TABS
1000.0000 mg | ORAL_TABLET | Freq: Once | ORAL | Status: AC
  Administered 2017-05-16: 1000 mg via ORAL

## 2017-05-16 MED ORDER — FLUCONAZOLE 150 MG PO TABS: 150.0000 mg | ORAL_TABLET | Freq: Every day | ORAL | 0 refills | Status: DC

## 2017-05-16 MED ORDER — CEFTRIAXONE SODIUM 250 MG IJ SOLR
INTRAMUSCULAR | Status: AC
  Filled 2017-05-16: qty 250

## 2017-05-16 NOTE — ED Triage Notes (Signed)
reoccurring "yeast infection" she thinks.  Patient did get treated for BV and yeast approx 3 weeks ago at [facility].  Patient continues to have vaginal discharge .  Denies pain

## 2017-05-16 NOTE — Discharge Instructions (Signed)
You were treated empirically for gonorrhea, chlamydia, trichomonas, BV, yeast.  Azithromycin 1g by mouth and Rocephin 250mg  injection given in office today. Start flagyl and diflucan as directed. Cytology sent, you will be contacted with any positive results that requires further treatment. Refrain from sexual activity and alcohol use for the next 7 days. Monitor for any worsening of symptoms, fever, abdominal pain, nausea, vomiting, to follow up for reevaluation.

## 2017-05-16 NOTE — ED Provider Notes (Signed)
[facility]    CSN: [id number] Arrival date & time: 05/16/17  1859     History   Chief Complaint Chief Complaint  Patient presents with  . Abdominal Pain    HPI Darlene Livingston is a 27 y.o. female.   27 year old female comes in for 3 day history of vaginal discharge. States was seen at [facility] and treated for BV and yeast. Vaginal discharge stopped, but started again 3 days ago. States has had some vaginal irritation as well. Denies fever, chills, night sweats. Denies abdominal pain, nausea, vomiting. With some dysuria, denies frequency, urgency, hematuria. Sexually active with 2 female partners, occasional condom use. Has an IUD in place for the past 3 years. LMP 04/22/2017. Has had changes in hygiene products.      Past Medical History:  Diagnosis Date  . Chlamydia   . Headache(784.0)   . Miscarriage    x2, one at 13weeks, one at [redacted] weeks gestation    Patient Active Problem List   Diagnosis Date Noted  . Labor and delivery indication for care or intervention 08/31/2014  . Decreased fetal movement determined by examination   . [redacted] weeks gestation of pregnancy   . Traumatic injury during pregnancy   . [redacted] weeks gestation of pregnancy   . Vaginal lesion 02/16/2012    Past Surgical History:  Procedure Laterality Date  . NO PAST SURGERIES      OB History    Gravida  4   Para  2   Term  2   Preterm  0   AB  2   Living  2     SAB  2   TAB  0   Ectopic  0   Multiple  0   Live Births  2            Home Medications    Prior to Admission medications   Medication Sig Start Date End Date Taking? Authorizing Provider  fluconazole (DIFLUCAN) 150 MG tablet Take 1 tablet (150 mg total) by mouth daily. Take second dose 72 hours later if symptoms still persists. 05/16/17   Tasia Catchings, Christiano Blandon V, PA-C  metroNIDAZOLE (FLAGYL) 500 MG tablet Take 1 tablet (500 mg total) by mouth 2 (two) times daily. 05/16/17   Ok Edwards, PA-C    Family History Family History    Problem Relation Age of Onset  . Hypertension Mother   . Depression Maternal Aunt   . Diabetes Maternal Aunt   . Depression Maternal Uncle   . Diabetes Maternal Uncle   . Asthma Paternal Aunt   . Cancer Paternal Aunt   . Depression Paternal Aunt   . Diabetes Paternal Aunt   . Asthma Paternal Uncle   . Depression Paternal Uncle   . Diabetes Paternal Uncle   . Birth defects Cousin   . Anesthesia problems Neg Hx   . Hypotension Neg Hx   . Malignant hyperthermia Neg Hx   . Pseudochol deficiency Neg Hx     Social History Social History   Tobacco Use  . Smoking status: Never Smoker  . Smokeless tobacco: Never Used  Substance Use Topics  . Alcohol use: No    Comment: social  . Drug use: No     Allergies   Fruit blend   Review of Systems Review of Systems  Reason unable to perform ROS: See HPI as above.     Physical Exam Triage Vital Signs ED Triage Vitals  Enc Vitals Group  BP 05/16/17 1927 127/79     Pulse Rate 05/16/17 1927 63     Resp 05/16/17 1927 18     Temp 05/16/17 1927 98.2 F (36.8 C)     Temp Source 05/16/17 1927 Oral     SpO2 05/16/17 1927 100 %     Weight --      Height --      Head Circumference --      Peak Flow --      Pain Score 05/16/17 1925 0     Pain Loc --      Pain Edu? --      Excl. in [place]? --    No data found.  Updated Vital Signs BP 127/79 (BP Location: Left Arm)   Pulse 63   Temp 98.2 F (36.8 C) (Oral)   Resp 18   LMP 04/22/2017   SpO2 100%   Physical Exam  Constitutional: She is oriented to person, place, and time. She appears well-developed and well-nourished.  Non-toxic appearance. She does not appear ill. No distress.  HENT:  Head: Normocephalic and atraumatic.  Eyes: Pupils are equal, round, and reactive to light. Conjunctivae are normal.  Cardiovascular: Normal rate, regular rhythm and normal heart sounds. Exam reveals no gallop and no friction rub.  No murmur heard. Pulmonary/Chest: Effort normal and breath  sounds normal. She has no wheezes. She has no rales.  Abdominal: Soft. Bowel sounds are normal. She exhibits no mass. There is no tenderness. There is no rebound, no guarding and no CVA tenderness.  Neurological: She is alert and oriented to person, place, and time.  Skin: Skin is warm and dry.  Psychiatric: She has a normal mood and affect. Her behavior is normal. Judgment normal.     UC Treatments / Results  Labs (all labs ordered are listed, but only abnormal results are displayed) Labs Reviewed  POCT URINALYSIS DIP (DEVICE) - Abnormal; Notable for the following components:      Result Value   Bilirubin Urine SMALL (*)    Ketones, ur 15 (*)    Urobilinogen, UA 2.0 (*)    Leukocytes, UA SMALL (*)    All other components within normal limits  URINE CULTURE  POCT PREGNANCY, URINE  URINE CYTOLOGY ANCILLARY ONLY    EKG None Radiology No results found.  Procedures Procedures (including critical care time)  Medications Ordered in UC Medications  azithromycin (ZITHROMAX) tablet 1,000 mg (1,000 mg Oral Given 05/16/17 2015)  cefTRIAXone (ROCEPHIN) injection 250 mg (250 mg Intramuscular Given 05/16/17 2015)     Initial Impression / Assessment and Plan / UC Course  I have reviewed the triage vital signs and the nursing notes.  Pertinent labs & imaging results that were available during my care of the patient were reviewed by me and considered in my medical decision making (see chart for details).    Patient would like to be treated empirically for STDs as well.  Will treat for gonorrhea, chlamydia, trichomonas, BV, yeast.  Azithromycin and Rocephin given in office today.  Diflucan and Flagyl as directed.  Patient to discontinue new hygiene products for possible causes of vaginal discharge.  Cytology sent, patient will be contacted with any positive results that require additional treatment. Patient to refrain from sexual activity for the next 7 days. Return precautions given.   Patient expresses understanding and agrees to plan.   Final Clinical Impressions(s) / UC Diagnoses   Final diagnoses:  Vaginal discharge    ED Discharge Orders  Ordered    metroNIDAZOLE (FLAGYL) 500 MG tablet  2 times daily     05/16/17 2001    fluconazole (DIFLUCAN) 150 MG tablet  Daily     05/16/17 2001        Ok Edwards, [place] 05/16/17 2135

## 2017-05-17 LAB — URINE CYTOLOGY ANCILLARY ONLY
CHLAMYDIA, DNA PROBE: POSITIVE — AB
NEISSERIA GONORRHEA: NEGATIVE
TRICH (WINDOWPATH): NEGATIVE

## 2017-05-18 LAB — URINE CULTURE

## 2017-05-18 NOTE — Progress Notes (Signed)
Urine culture does not suggest uti.  Chlamydia is positive.  This was treated at the urgent care visit with po zithromax 1g.  Pt contacted and made aware of results. Educated pt to please refrain from sexual intercourse for 7 days to give the medicine time to work.  Sexual partners need to be notified and tested/treated.  Condoms may reduce risk of reinfection.  Recheck or followup with PCP for further evaluation if symptoms are not improving.  [facility] notified  Reports throwing up later in the day after taking medication here at urgent care, pt denies symptoms now.  Pt encouraged if she starts to develop symptoms again to be tested again.

## 2017-05-20 LAB — URINE CYTOLOGY ANCILLARY ONLY
Bacterial vaginitis: POSITIVE — AB
Candida vaginitis: POSITIVE — AB

## 2017-05-21 NOTE — Progress Notes (Signed)
Attempted to reach patient regarding Bacterial Vaginosis test is positive.  Prescription for metronidazole was given at the urgent care visit and Candida (yeast) is positive.  Prescription for fluconazole was given at the urgent care visit.Recheck or followup with PCP for further evaluation if symptoms are not improving. No answer at this time and no voice mailbox set up.

## 2017-06-07 ENCOUNTER — Encounter (HOSPITAL_COMMUNITY): Payer: Self-pay | Admitting: *Deleted

## 2017-06-07 ENCOUNTER — Inpatient Hospital Stay (HOSPITAL_COMMUNITY)
Admission: AD | Admit: 2017-06-07 | Discharge: 2017-06-07 | Disposition: A | Discharge status: Home / Self Care | Payer: BLUE CROSS/BLUE SHIELD | Source: Ambulatory Visit | Attending: Obstetrics | Admitting: Obstetrics

## 2017-06-07 DIAGNOSIS — Z8249 Family history of ischemic heart disease and other diseases of the circulatory system: Secondary | ICD-10-CM | POA: Diagnosis not present

## 2017-06-07 DIAGNOSIS — Z818 Family history of other mental and behavioral disorders: Secondary | ICD-10-CM | POA: Insufficient documentation

## 2017-06-07 DIAGNOSIS — Z825 Family history of asthma and other chronic lower respiratory diseases: Secondary | ICD-10-CM | POA: Diagnosis not present

## 2017-06-07 DIAGNOSIS — Z833 Family history of diabetes mellitus: Secondary | ICD-10-CM | POA: Diagnosis not present

## 2017-06-07 DIAGNOSIS — Z79899 Other long term (current) drug therapy: Secondary | ICD-10-CM | POA: Insufficient documentation

## 2017-06-07 DIAGNOSIS — N898 Other specified noninflammatory disorders of vagina: Secondary | ICD-10-CM | POA: Diagnosis not present

## 2017-06-07 DIAGNOSIS — R102 Pelvic and perineal pain: Secondary | ICD-10-CM | POA: Insufficient documentation

## 2017-06-07 DIAGNOSIS — Z202 Contact with and (suspected) exposure to infections with a predominantly sexual mode of transmission: Secondary | ICD-10-CM | POA: Insufficient documentation

## 2017-06-07 DIAGNOSIS — Z91018 Allergy to other foods: Secondary | ICD-10-CM | POA: Insufficient documentation

## 2017-06-07 DIAGNOSIS — Z975 Presence of (intrauterine) contraceptive device: Secondary | ICD-10-CM | POA: Insufficient documentation

## 2017-06-07 LAB — URINALYSIS, ROUTINE W REFLEX MICROSCOPIC
BILIRUBIN URINE: NEGATIVE
Glucose, UA: NEGATIVE mg/dL
HGB URINE DIPSTICK: NEGATIVE
Ketones, ur: NEGATIVE mg/dL
Leukocytes, UA: NEGATIVE
NITRITE: NEGATIVE
Protein, ur: NEGATIVE mg/dL
Specific Gravity, Urine: 1.031 — ABNORMAL HIGH (ref 1.005–1.030)
pH: 5 (ref 5.0–8.0)

## 2017-06-07 LAB — POCT PREGNANCY, URINE: PREG TEST UR: NEGATIVE

## 2017-06-07 LAB — WET PREP, GENITAL
SPERM: NONE SEEN
TRICH WET PREP: NONE SEEN
Yeast Wet Prep HPF POC: NONE SEEN

## 2017-06-07 MED ORDER — ONDANSETRON 8 MG PO TBDP
8.0000 mg | ORAL_TABLET | Freq: Once | ORAL | Status: AC
  Administered 2017-06-07: 8 mg via ORAL
  Filled 2017-06-07: qty 1

## 2017-06-07 MED ORDER — CEFTRIAXONE SODIUM 250 MG IJ SOLR
250.0000 mg | Freq: Once | INTRAMUSCULAR | Status: AC
  Administered 2017-06-07: 250 mg via INTRAMUSCULAR
  Filled 2017-06-07: qty 250

## 2017-06-07 MED ORDER — AZITHROMYCIN 250 MG PO TABS
1000.0000 mg | ORAL_TABLET | Freq: Once | ORAL | Status: AC
  Administered 2017-06-07: 1000 mg via ORAL
  Filled 2017-06-07: qty 4

## 2017-06-07 NOTE — MAU Note (Signed)
About 2-3 wks ago test positive for Chlamydia. Took meds at hospital and after I left I threw up. My period started wk later. Had sex again with same partner and told me had been treated. Having abd cramping since Tues and white vag d/c.

## 2017-06-07 NOTE — Discharge Instructions (Signed)
In late 2019, the [facility] will be moving to the [place]. At that time, the MAU (Maternity Admissions Unit), where you are being seen today, will no longer take care of non-pregnant patients. We strongly encourage you to find a doctor's office before that time, so that you can be seen with any GYN concerns, like vaginal discharge, urinary tract infection, etc.. in a timely manner.  In order to make an office visit more convenient, the [facility] [place] at [facility] will be offering evening hours with same-day appointments, walk-in appointments and scheduled appointments available during this time.  [facility] for [facility] Hours: Monday - 8am - 7:30 pm with walk-in between 4pm- 7:30 pm Tuesday - 8 am - 5 pm (starting 04/23/17 we will be open late and accepting walk-ins from 4pm - 7:30pm) Wednesday - 8 am - 5 pm (starting 07/24/17 we will be open late and accepting walk-ins from 4pm - 7:30pm) Thursday 8 am - 5 pm (starting 10/24/17 we will be open late and accepting walk-ins from 4pm - 7:30pm) Friday 8 am - 5 pm  For an appointment please call the [facility] [place] @ [facility] at [phone]  For urgent needs, [facility] is also available for management of urgent GYN complaints such as vaginal discharge or urinary tract infections.    Safe Sex Practicing safe sex means taking steps before and during sex to reduce your risk of:  Getting an STD (sexually transmitted disease).  Giving your partner an STD.  Unwanted pregnancy.  How can I practice safe sex?  To practice safe sex:  Limit your sexual partners to only one partner who is having sex with only you.  Avoid using alcohol and recreational drugs before having sex. These substances can affect your judgment.  Before having sex with a new partner: ? Talk to your partner about past partners, past STDs, and drug use. ? You and your partner should  be screened for STDs and discuss the results with each other.  Check your body regularly for sores, blisters, rashes, or unusual discharge. If you notice any of these problems, visit your health care provider.  If you have symptoms of an infection or you are being treated for an STD, avoid sexual contact.  While having sex, use a condom. Make sure to: ? Use a condom every time you have vaginal, oral, or anal sex. Both females and males should wear condoms during oral sex. ? Keep condoms in place from the beginning to the end of sexual activity. ? Use a latex condom, if possible. Latex condoms offer the best protection. ? Use only water-based lubricants or oils to lubricate a condom. Using petroleum-based lubricants or oils will weaken the condom and increase the chance that it will break.  See your health care provider for regular screenings, exams, and tests for STDs.  Talk with your health care provider about the form of birth control (contraception) that is best for you.  Get vaccinated against hepatitis B and human papillomavirus (HPV).  If you are at risk of being infected with HIV (human immunodeficiency virus), talk with your health care provider about taking a prescription medicine to prevent HIV infection. You are considered at risk for HIV if: ? You are a man who has sex with other men. ? You are a heterosexual man or woman who is sexually active with more than one partner. ? You take drugs by injection. ? You  are sexually active with a partner who has HIV.  This information is not intended to replace advice given to you by your health care provider. Make sure you discuss any questions you have with your health care provider. Document Released: 02/16/2004 Document Revised: 05/25/2015 Document Reviewed: 11/28/2014 Elsevier Interactive Patient Education  [employer].

## 2017-06-07 NOTE — Progress Notes (Signed)
Written and verbal d/c instructions given and understanding voiced. 

## 2017-06-07 NOTE — MAU Provider Note (Signed)
History     CSN: [id number]  Arrival date and time: 06/07/17 2106   First Provider Initiated Contact with Patient 06/07/17 2217      Chief Complaint  Patient presents with  . Pelvic Pain   Darlene Livingston is a 27 y.o. [phone] who presents today with lower abdominal pain. She was treated for STDs on 05/16/17. She states that she vomited soon after taking the medication in the ED. She states that she does not believe that her partner was treated.   Pelvic Pain  The patient's primary symptoms include pelvic pain and vaginal discharge. This is a new problem. The current episode started in the past 7 days. The problem occurs intermittently. The problem has been unchanged. Pain severity now: 5/10. The problem affects the left side. She is not pregnant. Associated symptoms include nausea. Pertinent negatives include no chills, dysuria, fever, frequency or vomiting. The vaginal discharge was thick, white and malodorous. There has been no bleeding. Nothing aggravates the symptoms. She has tried nothing for the symptoms. She is sexually active. She uses an IUD for contraception. Her menstrual history has been regular (LMP: approx first week of May).   Past Medical History:  Diagnosis Date  . Chlamydia   . Headache(784.0)   . Miscarriage    x2, one at 13weeks, one at [redacted] weeks gestation    Past Surgical History:  Procedure Laterality Date  . NO PAST SURGERIES      Family History  Problem Relation Age of Onset  . Hypertension Mother   . Depression Maternal Aunt   . Diabetes Maternal Aunt   . Depression Maternal Uncle   . Diabetes Maternal Uncle   . Asthma Paternal Aunt   . Cancer Paternal Aunt   . Depression Paternal Aunt   . Diabetes Paternal Aunt   . Asthma Paternal Uncle   . Depression Paternal Uncle   . Diabetes Paternal Uncle   . Birth defects Cousin   . Anesthesia problems Neg Hx   . Hypotension Neg Hx   . Malignant hyperthermia Neg Hx   . Pseudochol deficiency Neg Hx      Social History   Tobacco Use  . Smoking status: Never Smoker  . Smokeless tobacco: Never Used  Substance Use Topics  . Alcohol use: No    Comment: social  . Drug use: No    Allergies:  Allergies  Allergen Reactions  . Fruit Blend Other (See Comments)    Sore throat from eating acidic fresh fruits    Medications Prior to Admission  Medication Sig Dispense Refill Last Dose  . fluconazole (DIFLUCAN) 150 MG tablet Take 1 tablet (150 mg total) by mouth daily. Take second dose 72 hours later if symptoms still persists. 2 tablet 0   . metroNIDAZOLE (FLAGYL) 500 MG tablet Take 1 tablet (500 mg total) by mouth 2 (two) times daily. 14 tablet 0     Review of Systems  Constitutional: Negative for chills and fever.  Gastrointestinal: Positive for nausea. Negative for vomiting.  Genitourinary: Positive for pelvic pain and vaginal discharge. Negative for dysuria, frequency and vaginal bleeding.   Physical Exam   Blood pressure 108/65, pulse 62, temperature 98.8 F (37.1 C), resp. rate 18, height 5\' 6"  (1.676 m), weight 152 lb (68.9 kg), last menstrual period 05/19/2017, unknown if currently breastfeeding.  Physical Exam  Nursing note and vitals reviewed. Constitutional: She is oriented to person, place, and time. She appears well-developed and well-nourished. No distress.  HENT:  Head:  Normocephalic.  Cardiovascular: Normal rate.  Respiratory: Effort normal.  GI: Soft. There is no tenderness. There is no rebound.  Neurological: She is alert and oriented to person, place, and time.  Skin: Skin is warm and dry.  Psychiatric: She has a normal mood and affect.   Results for orders placed or performed during the hospital encounter of 06/07/17 (from the past 24 hour(s))  Urinalysis, Routine w reflex microscopic     Status: Abnormal   Collection Time: 06/07/17  9:40 PM  Result Value Ref Range   Color, Urine YELLOW YELLOW   APPearance CLEAR CLEAR   Specific Gravity, Urine 1.031 (H)  1.005 - 1.030   pH 5.0 5.0 - 8.0   Glucose, UA NEGATIVE NEGATIVE mg/dL   Hgb urine dipstick NEGATIVE NEGATIVE   Bilirubin Urine NEGATIVE NEGATIVE   Ketones, ur NEGATIVE NEGATIVE mg/dL   Protein, ur NEGATIVE NEGATIVE mg/dL   Nitrite NEGATIVE NEGATIVE   Leukocytes, UA NEGATIVE NEGATIVE  Pregnancy, urine POC     Status: None   Collection Time: 06/07/17 10:22 PM  Result Value Ref Range   Preg Test, Ur NEGATIVE NEGATIVE  Wet prep, genital     Status: Abnormal   Collection Time: 06/07/17 10:40 PM  Result Value Ref Range   Yeast Wet Prep HPF POC NONE SEEN NONE SEEN   Trich, Wet Prep NONE SEEN NONE SEEN   Clue Cells Wet Prep HPF POC PRESENT (A) NONE SEEN   WBC, Wet Prep HPF POC MODERATE (A) NONE SEEN   Sperm NONE SEEN     MAU Course  Procedures  MDM  Patient treated here today with 1g Azithromycin and 250mg  Rocephin IM  Assessment and Plan   1. Exposure to sexually transmitted disease (STD)    DC home Comfort measures reviewed  RX: none, treated here today  Return to MAU as needed   Follow-up Information    Allyn Kenner, DO Follow up.   Specialty:  Obstetrics and Gynecology Contact information: [address] [place] [address] [place] 06/07/2017, 10:17 PM

## 2017-06-08 LAB — RPR: RPR: NONREACTIVE

## 2017-06-08 LAB — HIV ANTIBODY (ROUTINE TESTING W REFLEX): HIV Screen 4th Generation wRfx: NONREACTIVE

## 2017-06-10 LAB — GC/CHLAMYDIA PROBE AMP (~~LOC~~) NOT AT ARMC
Chlamydia: POSITIVE — AB
Neisseria Gonorrhea: NEGATIVE

## 2017-08-25 ENCOUNTER — Encounter (HOSPITAL_COMMUNITY): Payer: Self-pay

## 2017-08-25 ENCOUNTER — Inpatient Hospital Stay (HOSPITAL_COMMUNITY)
Admission: AD | Admit: 2017-08-25 | Discharge: 2017-08-25 | Disposition: A | Discharge status: Home / Self Care | Payer: BLUE CROSS/BLUE SHIELD | Source: Ambulatory Visit | Attending: Obstetrics & Gynecology | Admitting: Obstetrics & Gynecology

## 2017-08-25 ENCOUNTER — Other Ambulatory Visit: Payer: Self-pay

## 2017-08-25 DIAGNOSIS — Z825 Family history of asthma and other chronic lower respiratory diseases: Secondary | ICD-10-CM | POA: Diagnosis not present

## 2017-08-25 DIAGNOSIS — Z818 Family history of other mental and behavioral disorders: Secondary | ICD-10-CM | POA: Insufficient documentation

## 2017-08-25 DIAGNOSIS — Z8249 Family history of ischemic heart disease and other diseases of the circulatory system: Secondary | ICD-10-CM | POA: Insufficient documentation

## 2017-08-25 DIAGNOSIS — R1032 Left lower quadrant pain: Secondary | ICD-10-CM | POA: Diagnosis present

## 2017-08-25 DIAGNOSIS — Z91018 Allergy to other foods: Secondary | ICD-10-CM | POA: Diagnosis not present

## 2017-08-25 DIAGNOSIS — Z833 Family history of diabetes mellitus: Secondary | ICD-10-CM | POA: Insufficient documentation

## 2017-08-25 DIAGNOSIS — Z8489 Family history of other specified conditions: Secondary | ICD-10-CM | POA: Diagnosis not present

## 2017-08-25 DIAGNOSIS — N760 Acute vaginitis: Secondary | ICD-10-CM | POA: Diagnosis not present

## 2017-08-25 DIAGNOSIS — Z3202 Encounter for pregnancy test, result negative: Secondary | ICD-10-CM | POA: Diagnosis not present

## 2017-08-25 DIAGNOSIS — B9689 Other specified bacterial agents as the cause of diseases classified elsewhere: Secondary | ICD-10-CM

## 2017-08-25 DIAGNOSIS — Z809 Family history of malignant neoplasm, unspecified: Secondary | ICD-10-CM | POA: Diagnosis not present

## 2017-08-25 LAB — WET PREP, GENITAL
Sperm: NONE SEEN
Trich, Wet Prep: NONE SEEN
Yeast Wet Prep HPF POC: NONE SEEN

## 2017-08-25 LAB — CBC WITH DIFFERENTIAL/PLATELET
Basophils Absolute: 0 10*3/uL (ref 0.0–0.1)
Basophils Relative: 0 %
Eosinophils Absolute: 0.1 10*3/uL (ref 0.0–0.7)
Eosinophils Relative: 2 %
HCT: 35 % — ABNORMAL LOW (ref 36.0–46.0)
Hemoglobin: 12.1 g/dL (ref 12.0–15.0)
LYMPHS PCT: 45 %
Lymphs Abs: 2 10*3/uL (ref 0.7–4.0)
MCH: 32.6 pg (ref 26.0–34.0)
MCHC: 34.6 g/dL (ref 30.0–36.0)
MCV: 94.3 fL (ref 78.0–100.0)
MONO ABS: 0.2 10*3/uL (ref 0.1–1.0)
Monocytes Relative: 4 %
NEUTROS ABS: 2.2 10*3/uL (ref 1.7–7.7)
NEUTROS PCT: 49 %
PLATELETS: 235 10*3/uL (ref 150–400)
RBC: 3.71 MIL/uL — ABNORMAL LOW (ref 3.87–5.11)
RDW: 13.5 % (ref 11.5–15.5)
WBC: 4.5 10*3/uL (ref 4.0–10.5)

## 2017-08-25 LAB — URINALYSIS, ROUTINE W REFLEX MICROSCOPIC
Bilirubin Urine: NEGATIVE
GLUCOSE, UA: NEGATIVE mg/dL
HGB URINE DIPSTICK: NEGATIVE
Ketones, ur: NEGATIVE mg/dL
LEUKOCYTES UA: NEGATIVE
Nitrite: NEGATIVE
PROTEIN: NEGATIVE mg/dL
SPECIFIC GRAVITY, URINE: 1.02 (ref 1.005–1.030)
pH: 6 (ref 5.0–8.0)

## 2017-08-25 LAB — POCT PREGNANCY, URINE: PREG TEST UR: NEGATIVE

## 2017-08-25 MED ORDER — METRONIDAZOLE 500 MG PO TABS: 500.0000 mg | ORAL_TABLET | Freq: Two times a day (BID) | ORAL | 0 refills | Status: DC

## 2017-08-25 NOTE — MAU Provider Note (Signed)
History     CSN: [id number]  Arrival date and time: 08/25/17 1819   None     Chief Complaint  Patient presents with  . Abdominal Pain   HPI Darlene Livingston is 27 y.o. [phone] presents with complaints of left lower quadrant abdominal "aching", vaginal itching/swelling X 2 days.  Thinks she may have an yeast infection.  Rating pain as 5/10. Is taking Ibuprofen for a toothache and has not seen that it improved abdominal pain.  Neg for N&V, Diarrhea, constipation.  New sexual partner.  Uses condoms most of the time.  Has Mirena IUD for contraception.      Past Medical History:  Diagnosis Date  . Chlamydia   . Headache(784.0)   . Miscarriage    x2, one at 13weeks, one at [redacted] weeks gestation    Past Surgical History:  Procedure Laterality Date  . NO PAST SURGERIES      Family History  Problem Relation Age of Onset  . Hypertension Mother   . Depression Maternal Aunt   . Diabetes Maternal Aunt   . Depression Maternal Uncle   . Diabetes Maternal Uncle   . Asthma Paternal Aunt   . Cancer Paternal Aunt   . Depression Paternal Aunt   . Diabetes Paternal Aunt   . Asthma Paternal Uncle   . Depression Paternal Uncle   . Diabetes Paternal Uncle   . Birth defects Cousin   . Anesthesia problems Neg Hx   . Hypotension Neg Hx   . Malignant hyperthermia Neg Hx   . Pseudochol deficiency Neg Hx     Social History   Tobacco Use  . Smoking status: Never Smoker  . Smokeless tobacco: Never Used  Substance Use Topics  . Alcohol use: No    Comment: social  . Drug use: No    Allergies:  Allergies  Allergen Reactions  . Fruit Blend Other (See Comments)    Sore throat from eating acidic fresh fruits    No medications prior to admission.    Review of Systems  Constitutional: Negative for activity change and appetite change.  Cardiovascular: Negative for chest pain.  Gastrointestinal: Positive for abdominal pain (left lower quadrant). Negative for constipation, diarrhea,  nausea and vomiting.  Genitourinary: Negative for dysuria, frequency and vaginal discharge.       + for external vulvar and vaginal itching.   Psychiatric/Behavioral: Negative for agitation and behavioral problems. The patient is not nervous/anxious.    Physical Exam   Blood pressure (!) 120/57, pulse 66, temperature 98 F (36.7 C), temperature source Oral, resp. rate 16, weight 157 lb (71.2 kg), last menstrual period 08/05/2017, SpO2 100 %, unknown if currently breastfeeding.  Physical Exam  Nursing note and vitals reviewed. Constitutional: She is oriented to person, place, and time. She appears well-developed and well-nourished. No distress.  HENT:  Head: Normocephalic.  Neck: Normal range of motion.  Cardiovascular: Normal rate.  Respiratory: Effort normal.  GI: Soft. She exhibits no distension and no mass. There is tenderness (mild tenderness in the left lower quadrant). There is no rebound and no guarding.  Genitourinary: There is no rash, tenderness or lesion on the right labia. There is no rash, tenderness or lesion on the left labia. Uterus is not enlarged and not tender. Cervix exhibits no discharge and no friability. Right adnexum displays no mass, no tenderness and no fullness. Left adnexum displays no mass, no tenderness and no fullness. Vaginal discharge (small amount of white/yellow discharge without odor.  )  found.  Genitourinary Comments: Cervix IUD string noted  Neurological: She is alert and oriented to person, place, and time.  Skin: Skin is warm and dry.  Psychiatric: She has a normal mood and affect. Her behavior is normal. Judgment and thought content normal.   Results for orders placed or performed during the hospital encounter of 08/25/17 (from the past 24 hour(s))  Urinalysis, Routine w reflex microscopic     Status: Abnormal   Collection Time: 08/25/17  6:53 PM  Result Value Ref Range   Color, Urine YELLOW YELLOW   APPearance HAZY (A) CLEAR   Specific Gravity,  Urine 1.020 1.005 - 1.030   pH 6.0 5.0 - 8.0   Glucose, UA NEGATIVE NEGATIVE mg/dL   Hgb urine dipstick NEGATIVE NEGATIVE   Bilirubin Urine NEGATIVE NEGATIVE   Ketones, ur NEGATIVE NEGATIVE mg/dL   Protein, ur NEGATIVE NEGATIVE mg/dL   Nitrite NEGATIVE NEGATIVE   Leukocytes, UA NEGATIVE NEGATIVE  Pregnancy, urine POC     Status: None   Collection Time: 08/25/17  6:55 PM  Result Value Ref Range   Preg Test, Ur NEGATIVE NEGATIVE  Wet prep, genital     Status: Abnormal   Collection Time: 08/25/17  7:27 PM  Result Value Ref Range   Yeast Wet Prep HPF POC NONE SEEN NONE SEEN   Trich, Wet Prep NONE SEEN NONE SEEN   Clue Cells Wet Prep HPF POC PRESENT (A) NONE SEEN   WBC, Wet Prep HPF POC MODERATE (A) NONE SEEN   Sperm NONE SEEN   CBC with Differential/Platelet     Status: Abnormal (Preliminary result)   Collection Time: 08/25/17  7:33 PM  Result Value Ref Range   WBC 4.5 4.0 - 10.5 K/uL   RBC 3.71 (L) 3.87 - 5.11 MIL/uL   Hemoglobin 12.1 12.0 - 15.0 g/dL   HCT 35.0 (L) 36.0 - 46.0 %   MCV 94.3 78.0 - 100.0 fL   MCH 32.6 26.0 - 34.0 pg   MCHC 34.6 30.0 - 36.0 g/dL   RDW 13.5 11.5 - 15.5 %   Platelets 235 150 - 400 K/uL   Neutrophils Relative % 49 %   Neutro Abs 2.2 1.7 - 7.7 K/uL   Lymphocytes Relative 45 %   Lymphs Abs 2.0 0.7 - 4.0 K/uL   Monocytes Relative 4 %   Monocytes Absolute 0.2 0.1 - 1.0 K/uL   Eosinophils Relative 2 %   Eosinophils Absolute 0.1 0.0 - 0.7 K/uL   Basophils Relative 0 %   Basophils Absolute 0.0 0.0 - 0.1 K/uL   Other PENDING %   MAU Course  Procedures  GC/CHL pending  MDM MSE Labs Exam Reviewed labs with patient, Rx to pharmacy  Assessment and Plan  A:  Left lower abdominal pain       Vaginal pruitis       Negative pregnancy test        Bacterial Vaginosis  P:  Rx for Flagyl to pharmacy      Stressed importance of condom use      She understands we will call her if GC/CHL results are positive  Eve M Tawsha Terrero 08/25/2017, 7:55 PM

## 2017-08-25 NOTE — MAU Note (Signed)
Pt having abdominal pain, describes as achy and having itching and swelling in her vaginal area. Not sure if she has a yeast infection. Pain 5/10.

## 2017-08-25 NOTE — Discharge Instructions (Signed)
Abdominal Pain, Adult Abdominal pain can be caused by many things. Often, abdominal pain is not serious and it gets better with no treatment or by being treated at home. However, sometimes abdominal pain is serious. Your health care provider will do a medical history and a physical exam to try to determine the cause of your abdominal pain. Follow these instructions at home:  Take over-the-counter and prescription medicines only as told by your health care provider. Do not take a laxative unless told by your health care provider.  Drink enough fluid to keep your urine clear or pale yellow.  Watch your condition for any changes.  Keep all follow-up visits as told by your health care provider. This is important. Contact a health care provider if:  Your abdominal pain changes or gets worse.  You are not hungry or you lose weight without trying.  You are constipated or have diarrhea for more than 2-3 days.  You have pain when you urinate or have a bowel movement.  Your abdominal pain wakes you up at night.  Your pain gets worse with meals, after eating, or with certain foods.  You are throwing up and cannot keep anything down.  You have a fever. Get help right away if:  Your pain does not go away as soon as your health care provider told you to expect.  You cannot stop throwing up.  Your pain is only in areas of the abdomen, such as the right side or the left lower portion of the abdomen.  You have bloody or black stools, or stools that look like tar.  You have severe pain, cramping, or bloating in your abdomen.  You have signs of dehydration, such as: ? Dark urine, very little urine, or no urine. ? Cracked lips. ? Dry mouth. ? Sunken eyes. ? Sleepiness. ? Weakness. This information is not intended to replace advice given to you by your health care provider. Make sure you discuss any questions you have with your health care provider. Document Released: 10/18/2004 Document  Revised: 07/29/2015 Document Reviewed: 06/22/2015 Elsevier Interactive Patient Education  2018 [employer]  Bacterial Vaginosis Bacterial vaginosis is an infection of the vagina. It happens when too many germs (bacteria) grow in the vagina. This infection puts you at risk for infections from sex (STIs). Treating this infection can lower your risk for some STIs. You should also treat this if you are pregnant. It can cause your baby to be born early. Follow these instructions at home: Medicines  Take over-the-counter and prescription medicines only as told by your doctor.  Take or use your antibiotic medicine as told by your doctor. Do not stop taking or using it even if you start to feel better. General instructions  If you your sexual partner is a woman, tell her that you have this infection. She needs to get treatment if she has symptoms. If you have a female partner, he does not need to be treated.  During treatment: ? Avoid sex. ? Do not douche. ? Avoid alcohol as told. ? Avoid breastfeeding as told.  Drink enough fluid to keep your pee (urine) clear or pale yellow.  Keep your vagina and butt (rectum) clean. ? Wash the area with warm water every day. ? Wipe from front to back after you use the toilet.  Keep all follow-up visits as told by your doctor. This is important. Preventing this condition  Do not douche.  Use only warm water to wash around your vagina.  Use protection when you have sex. This includes: ? Latex condoms. ? Dental dams.  Limit how many people you have sex with. It is best to only have sex with the same person (be monogamous).  Get tested for STIs. Have your partner get tested.  Wear underwear that is cotton or lined with cotton.  Avoid tight pants and pantyhose. This is most important in summer.  Do not use any products that have nicotine or tobacco in them. These include cigarettes and e-cigarettes. If you need help quitting, ask your  doctor.  Do not use illegal drugs.  Limit how much alcohol you drink. Contact a doctor if:  Your symptoms do not get better, even after you are treated.  You have more discharge or pain when you pee (urinate).  You have a fever.  You have pain in your belly (abdomen).  You have pain with sex.  Your bleed from your vagina between periods. Summary  This infection happens when too many germs (bacteria) grow in the vagina.  Treating this condition can lower your risk for some infections from sex (STIs).  You should also treat this if you are pregnant. It can cause early (premature) birth.  Do not stop taking or using your antibiotic medicine even if you start to feel better. This information is not intended to replace advice given to you by your health care provider. Make sure you discuss any questions you have with your health care provider. Document Released: 10/18/2007 Document Revised: 09/24/2015 Document Reviewed: 09/24/2015 Elsevier Interactive Patient Education  2017 [employer].

## 2017-08-26 LAB — GC/CHLAMYDIA PROBE AMP (~~LOC~~) NOT AT ARMC
Chlamydia: NEGATIVE
NEISSERIA GONORRHEA: NEGATIVE

## 2017-08-26 LAB — HIV ANTIBODY (ROUTINE TESTING W REFLEX): HIV Screen 4th Generation wRfx: NONREACTIVE

## 2017-10-26 ENCOUNTER — Encounter (HOSPITAL_COMMUNITY): Payer: Self-pay

## 2017-10-26 ENCOUNTER — Other Ambulatory Visit: Payer: Self-pay

## 2017-10-26 ENCOUNTER — Emergency Department (HOSPITAL_COMMUNITY)
Admission: EM | Admit: 2017-10-26 | Discharge: 2017-10-26 | Disposition: A | Discharge status: Home / Self Care | Payer: BLUE CROSS/BLUE SHIELD | Attending: Emergency Medicine | Admitting: Emergency Medicine

## 2017-10-26 DIAGNOSIS — K0889 Other specified disorders of teeth and supporting structures: Secondary | ICD-10-CM | POA: Diagnosis not present

## 2017-10-26 MED ORDER — PENICILLIN V POTASSIUM 500 MG PO TABS: 500.0000 mg | ORAL_TABLET | Freq: Three times a day (TID) | ORAL | 0 refills | Status: DC

## 2017-10-26 MED ORDER — IBUPROFEN 600 MG PO TABS: 600.0000 mg | ORAL_TABLET | Freq: Four times a day (QID) | ORAL | 0 refills | Status: DC | PRN

## 2017-10-26 NOTE — ED Triage Notes (Signed)
Pt states she has been having left lower dental pain for a while, but today it is worse. Pt states that she has had all her wisdom teeth removed expect that one. Pt states that she has not had relief with BC's.

## 2017-10-26 NOTE — ED Provider Notes (Signed)
[facility] DEPT Provider Note   CSN: [id number] Arrival date & time: 10/26/17  1659     History   Chief Complaint Chief Complaint  Patient presents with  . Dental Pain    HPI Darlene Livingston is a 27 y.o. female.  The history is provided by the patient. No language interpreter was used.  Dental Pain       27 year old female presenting for evaluation of dental pain.  Patient report for the past week she has had persistent pain to her left lower tooth.  Pain is sharp throbbing achy moderate in severity, worsening with chewing and now with temperature change the pain became unbearable.  She has had this pain in the past which is waxing waning.  She denies any associated fever, chills, ear pain, jaw pain or neck pain.  She has been using BC powder as well as ibuprofen without adequate relief.  She does have a dentist and does have an appointment coming up however she is here due to worsening pain.  Past Medical History:  Diagnosis Date  . Chlamydia   . Headache(784.0)   . Miscarriage    x2, one at 13weeks, one at [redacted] weeks gestation    Patient Active Problem List   Diagnosis Date Noted  . Labor and delivery indication for care or intervention 08/31/2014  . Decreased fetal movement determined by examination   . [redacted] weeks gestation of pregnancy   . Traumatic injury during pregnancy   . [redacted] weeks gestation of pregnancy   . Vaginal lesion 02/16/2012    Past Surgical History:  Procedure Laterality Date  . NO PAST SURGERIES       OB History    Gravida  4   Para  2   Term  2   Preterm  0   AB  2   Living  2     SAB  2   TAB  0   Ectopic  0   Multiple  0   Live Births  2            Home Medications    Prior to Admission medications   Medication Sig Start Date End Date Taking? Authorizing Provider  metroNIDAZOLE (FLAGYL) 500 MG tablet Take 1 tablet (500 mg total) by mouth 2 (two) times daily. 08/25/17   KeyNelia Shi, NP      Family History Family History  Problem Relation Age of Onset  . Hypertension Mother   . Depression Maternal Aunt   . Diabetes Maternal Aunt   . Depression Maternal Uncle   . Diabetes Maternal Uncle   . Asthma Paternal Aunt   . Cancer Paternal Aunt   . Depression Paternal Aunt   . Diabetes Paternal Aunt   . Asthma Paternal Uncle   . Depression Paternal Uncle   . Diabetes Paternal Uncle   . Birth defects Cousin   . Anesthesia problems Neg Hx   . Hypotension Neg Hx   . Malignant hyperthermia Neg Hx   . Pseudochol deficiency Neg Hx     Social History Social History   Tobacco Use  . Smoking status: Never Smoker  . Smokeless tobacco: Never Used  Substance Use Topics  . Alcohol use: No    Comment: social  . Drug use: No     Allergies   Fruit blend   Review of Systems Review of Systems  Constitutional: Negative for fever.  HENT: Positive for dental problem.  Physical Exam Updated Vital Signs BP (!) 139/103 (BP Location: Left Arm)   Pulse 65   Temp 99 F (37.2 C) (Oral)   Resp 14   Ht 5\' 6"  (1.676 m)   Wt 69.9 kg   SpO2 100%   BMI 24.86 kg/m   Physical Exam  Constitutional: She appears well-developed and well-nourished. No distress.  HENT:  Head: Atraumatic.  Mouth: Tenderness to tooth #17 with an obvious old dental fracture but no significant gingival erythema or abscess noted.  No trismus.  Eyes: Conjunctivae are normal.  Neck: Neck supple.  Lymphadenopathy:    She has no cervical adenopathy.  Neurological: She is alert.  Skin: No rash noted.  Psychiatric: She has a normal mood and affect.  Nursing note and vitals reviewed.    ED Treatments / Results  Labs (all labs ordered are listed, but only abnormal results are displayed) Labs Reviewed - No data to display  EKG None  Radiology No results found.  Procedures Procedures (including critical care time)  Medications Ordered in ED Medications - No data to display   Initial  Impression / Assessment and Plan / ED Course  I have reviewed the triage vital signs and the nursing notes.  Pertinent labs & imaging results that were available during my care of the patient were reviewed by me and considered in my medical decision making (see chart for details).     BP (!) 139/103 (BP Location: Left Arm)   Pulse 65   Temp 99 F (37.2 C) (Oral)   Resp 14   Ht 5\' 6"  (1.676 m)   Wt 69.9 kg   SpO2 100%   BMI 24.86 kg/m    Final Clinical Impressions(s) / ED Diagnoses   Final diagnoses:  Pain, dental    ED Discharge Orders         Ordered    penicillin v potassium (VEETID) 500 MG tablet  3 times daily     10/26/17 1911    ibuprofen (ADVIL,MOTRIN) 600 MG tablet  Every 6 hours PRN     10/26/17 1911         Patient with dentalgia.  No abscess requiring immediate incision and drainage.  Exam not concerning for Ludwig's angina or pharyngeal abscess.  Will treat with PCN and NSAIDs. Pt instructed to follow-up with dentist.  Discussed return precautions. Pt safe for discharge.    Domenic Moras, PA-C 10/26/17 1912    Carmin Muskrat, MD 10/26/17 [phone]

## 2018-03-06 ENCOUNTER — Other Ambulatory Visit: Payer: Self-pay

## 2018-03-06 ENCOUNTER — Inpatient Hospital Stay (HOSPITAL_COMMUNITY)
Admission: AD | Admit: 2018-03-06 | Discharge: 2018-03-07 | Disposition: A | Discharge status: Home / Self Care | Payer: BLUE CROSS/BLUE SHIELD | Attending: Obstetrics and Gynecology | Admitting: Obstetrics and Gynecology

## 2018-03-06 ENCOUNTER — Encounter (HOSPITAL_COMMUNITY): Payer: Self-pay | Admitting: *Deleted

## 2018-03-06 DIAGNOSIS — R102 Pelvic and perineal pain: Secondary | ICD-10-CM | POA: Insufficient documentation

## 2018-03-06 DIAGNOSIS — N760 Acute vaginitis: Secondary | ICD-10-CM

## 2018-03-06 DIAGNOSIS — B9689 Other specified bacterial agents as the cause of diseases classified elsewhere: Secondary | ICD-10-CM | POA: Insufficient documentation

## 2018-03-06 LAB — URINALYSIS, ROUTINE W REFLEX MICROSCOPIC
BILIRUBIN URINE: NEGATIVE
GLUCOSE, UA: NEGATIVE mg/dL
KETONES UR: NEGATIVE mg/dL
LEUKOCYTE UA: NEGATIVE
Nitrite: NEGATIVE
PH: 6 (ref 5.0–8.0)
Protein, ur: NEGATIVE mg/dL
Specific Gravity, Urine: 1.01 (ref 1.005–1.030)

## 2018-03-06 LAB — URINALYSIS, MICROSCOPIC (REFLEX)

## 2018-03-06 LAB — POCT PREGNANCY, URINE: Preg Test, Ur: NEGATIVE

## 2018-03-06 NOTE — MAU Note (Signed)
Pt reports to MAU c/o abdominal pain that started about 4 days ago. No bleeding. Vaginal discharge that is white in color. Pt think it is BV pt has a hx of it. Pt also reports having some diarrhea.

## 2018-03-07 ENCOUNTER — Other Ambulatory Visit: Payer: Self-pay | Admitting: Family Medicine

## 2018-03-07 DIAGNOSIS — A749 Chlamydial infection, unspecified: Secondary | ICD-10-CM

## 2018-03-07 DIAGNOSIS — B9689 Other specified bacterial agents as the cause of diseases classified elsewhere: Secondary | ICD-10-CM

## 2018-03-07 DIAGNOSIS — N760 Acute vaginitis: Secondary | ICD-10-CM

## 2018-03-07 LAB — WET PREP, GENITAL
SPERM: NONE SEEN
TRICH WET PREP: NONE SEEN
YEAST WET PREP: NONE SEEN

## 2018-03-07 LAB — GC/CHLAMYDIA PROBE AMP (~~LOC~~) NOT AT ARMC
Chlamydia: POSITIVE — AB
Neisseria Gonorrhea: NEGATIVE

## 2018-03-07 MED ORDER — METRONIDAZOLE 500 MG PO TABS: 500.0000 mg | ORAL_TABLET | Freq: Two times a day (BID) | ORAL | 0 refills | Status: DC

## 2018-03-07 MED ORDER — AZITHROMYCIN 500 MG PO TABS
1000.0000 mg | ORAL_TABLET | Freq: Once | ORAL | 0 refills | Status: AC
Start: 2018-03-07 — End: 2018-03-07

## 2018-03-07 NOTE — Progress Notes (Signed)
Called patient to inform her of positive chlamydia test. She did not answer and voicemail not set up. Azithromycin sent to patient's pharmacy and patient informed via mychart.   Aura Camps, MD 03/07/18 8:49 PM

## 2018-03-07 NOTE — Discharge Instructions (Signed)

## 2018-03-07 NOTE — MAU Provider Note (Addendum)
History     CSN: [id number]  Arrival date and time: 03/06/18 2152   First Provider Initiated Contact with Patient 03/06/18 2340      Chief Complaint  Patient presents with  . Abdominal Pain   HPI Darlene Livingston is a [phone] [id number] who presents today with a complaint of lower abdominal pain for the past 4 days. Patient states the pain has been gradually getting worse and is on the left side. Patient endorses white vaginal discharge that has an odor. Having mild pain with vaginal penetration the past day.  She endorses a history of BV and is concerned this may be a recurrence. She states she has been trying some over the counter remedies including probiotics to reduce the frequency. She is having heterosexual relations with multiple partners. Patient denies use of douching, vinegar, soap or other intravaginal methods of cleaning.    OB History    Gravida  4   Para  2   Term  2   Preterm  0   AB  2   Living  2     SAB  2   TAB  0   Ectopic  0   Multiple  0   Live Births  2           Past Medical History:  Diagnosis Date  . Chlamydia   . Headache(784.0)   . Miscarriage    x2, one at 13weeks, one at [redacted] weeks gestation    Past Surgical History:  Procedure Laterality Date  . NO PAST SURGERIES      Family History  Problem Relation Age of Onset  . Hypertension Mother   . Depression Maternal Aunt   . Diabetes Maternal Aunt   . Depression Maternal Uncle   . Diabetes Maternal Uncle   . Asthma Paternal Aunt   . Cancer Paternal Aunt   . Depression Paternal Aunt   . Diabetes Paternal Aunt   . Asthma Paternal Uncle   . Depression Paternal Uncle   . Diabetes Paternal Uncle   . Birth defects Cousin   . Anesthesia problems Neg Hx   . Hypotension Neg Hx   . Malignant hyperthermia Neg Hx   . Pseudochol deficiency Neg Hx     Social History   Tobacco Use  . Smoking status: Never Smoker  . Smokeless tobacco: Never Used  Substance Use Topics  . Alcohol  use: No    Comment: social  . Drug use: No    Allergies:  Allergies  Allergen Reactions  . Fruit Blend Other (See Comments)    Sore throat from eating acidic fresh fruits    Medications Prior to Admission  Medication Sig Dispense Refill Last Dose  . ibuprofen (ADVIL,MOTRIN) 600 MG tablet Take 1 tablet (600 mg total) by mouth every 6 (six) hours as needed. 30 tablet 0   . metroNIDAZOLE (FLAGYL) 500 MG tablet Take 1 tablet (500 mg total) by mouth 2 (two) times daily. 14 tablet 0   . penicillin v potassium (VEETID) 500 MG tablet Take 1 tablet (500 mg total) by mouth 3 (three) times daily. 30 tablet 0     Review of Systems  Patient denies fevers, nausea, vomiting, changes in bowel habits, dysuria, dysmenorrhea, changes in menstruation.  Physical Exam   Blood pressure 115/65, pulse 64, temperature 97.9 F (36.6 C), temperature source Oral, resp. rate 17, weight 73.2 kg, last menstrual period 02/22/2018, unknown if currently breastfeeding.  Physical Exam  Constitutional: She appears well-developed and  well-nourished.  HENT:  Head: Normocephalic and atraumatic.  Cardiovascular: Normal rate and regular rhythm.  Respiratory: Effort normal and breath sounds normal.  GI: Soft. Bowel sounds are normal. She exhibits no distension and no mass. There is abdominal tenderness (over left inguinal ligament). There is no rebound and no guarding.  Genitourinary: Cervix exhibits discharge. Cervix exhibits no friability.    Vaginal discharge (white) present.     No vaginal erythema, tenderness or bleeding.  No erythema, tenderness or bleeding in the vagina.    No foreign body in the vagina.       MAU Course  Procedures  A pelvic exam was performed with a wet prep and STI swab performed and sent to lab. Results for GC/Chlamydia are pending. Wet prep showed clue cells and moderate WBCs.  Assessment and Plan  Nanna is a 28yo female who presented with left pelvic pain and vaginal discharge.  Wet prep results are positive for clue cells indicating bacterial vaginosis. Will treat with metronidazole 500mg  BID for 7 days. GC and chlamydia are pending. Will call patient with results.  Alexandra L Kellogg 03/07/2018, 12:03 AM   OB FELLOW MAU DISCHARGE ATTESTATION  I have seen and examined this patient; I agree with above documentation in the resident's note.   Aura Camps, MD  OB Fellow  03/07/2018, 12:40 AM

## 2018-10-22 ENCOUNTER — Telehealth: Payer: Self-pay

## 2018-10-29 ENCOUNTER — Other Ambulatory Visit: Payer: Self-pay

## 2018-10-29 ENCOUNTER — Inpatient Hospital Stay (HOSPITAL_COMMUNITY)
Admission: AD | Admit: 2018-10-29 | Discharge: 2018-10-29 | Disposition: A | Discharge status: Home / Self Care | Payer: Self-pay | Attending: Obstetrics & Gynecology | Admitting: Obstetrics & Gynecology

## 2018-10-29 DIAGNOSIS — N898 Other specified noninflammatory disorders of vagina: Secondary | ICD-10-CM | POA: Insufficient documentation

## 2018-10-29 LAB — URINALYSIS, ROUTINE W REFLEX MICROSCOPIC
Bilirubin Urine: NEGATIVE
Glucose, UA: NEGATIVE mg/dL
Hgb urine dipstick: NEGATIVE
Ketones, ur: NEGATIVE mg/dL
Leukocytes,Ua: NEGATIVE
Nitrite: NEGATIVE
Protein, ur: NEGATIVE mg/dL
Specific Gravity, Urine: 1.026 (ref 1.005–1.030)
pH: 7 (ref 5.0–8.0)

## 2018-10-29 LAB — POCT PREGNANCY, URINE: Preg Test, Ur: NEGATIVE

## 2018-10-29 NOTE — MAU Note (Signed)
Reports on 9-17 had stressful things happening and felt like I got yeast infection and took monistat and it went away.  Then 7 days later, thick discharge, frequent urination and then sharp pains on left side abd area x 3 days. States Does not think she is pregnant. No bleeding. LMP was 10-09-18

## 2018-10-31 ENCOUNTER — Encounter (HOSPITAL_COMMUNITY): Payer: Self-pay | Admitting: Emergency Medicine

## 2018-10-31 ENCOUNTER — Other Ambulatory Visit: Payer: Self-pay

## 2018-10-31 ENCOUNTER — Ambulatory Visit (HOSPITAL_COMMUNITY)
Admission: EM | Admit: 2018-10-31 | Discharge: 2018-10-31 | Disposition: A | Discharge status: Home / Self Care | Payer: 59 | Attending: Internal Medicine | Admitting: Internal Medicine

## 2018-10-31 DIAGNOSIS — Z113 Encounter for screening for infections with a predominantly sexual mode of transmission: Secondary | ICD-10-CM

## 2018-10-31 DIAGNOSIS — Z202 Contact with and (suspected) exposure to infections with a predominantly sexual mode of transmission: Secondary | ICD-10-CM

## 2018-10-31 DIAGNOSIS — N760 Acute vaginitis: Secondary | ICD-10-CM | POA: Diagnosis not present

## 2018-10-31 DIAGNOSIS — Z3202 Encounter for pregnancy test, result negative: Secondary | ICD-10-CM

## 2018-10-31 LAB — POCT URINALYSIS DIP (DEVICE)
Bilirubin Urine: NEGATIVE
Glucose, UA: NEGATIVE mg/dL
Ketones, ur: NEGATIVE mg/dL
Leukocytes,Ua: NEGATIVE
Nitrite: NEGATIVE
Protein, ur: NEGATIVE mg/dL
Specific Gravity, Urine: >=1.03 (ref 1.005–1.030)
Urobilinogen, UA: 0.2 mg/dL (ref 0.0–1.0)
pH: 6 (ref 5.0–8.0)

## 2018-10-31 LAB — POCT PREGNANCY, URINE: Preg Test, Ur: NEGATIVE

## 2018-10-31 MED ORDER — FLUCONAZOLE 150 MG PO TABS: 150.0000 mg | ORAL_TABLET | Freq: Once | ORAL | 0 refills | Status: AC

## 2018-10-31 MED ORDER — METRONIDAZOLE 500 MG PO TABS: 500.0000 mg | ORAL_TABLET | Freq: Two times a day (BID) | ORAL | 0 refills | Status: AC

## 2018-10-31 NOTE — ED Triage Notes (Signed)
Pt reports vaginal d/c and itching x1 week.  Pt states she took Monistat and it relieved some of the inflammation, but she still feels the same symptoms.

## 2018-10-31 NOTE — Discharge Instructions (Signed)
Begin taking metronidazole twice daily for the next week, this will treat for bacterial vaginosis, please take with food Take 1 tablet of Diflucan today, may repeat second tablet after finishing course of metronidazole/Flagyl  We are testing you for Gonorrhea, Chlamydia, Trichomonas, Yeast and Bacterial Vaginosis. We will call you if anything is positive and let you know if you require any further treatment. Please inform partners of any positive results.   Please return if symptoms not improving with treatment, development of fever, nausea, vomiting, abdominal pain.

## 2018-10-31 NOTE — ED Provider Notes (Signed)
[facility]    CSN: [id number] Arrival date & time: 10/31/18  1303      History   Chief Complaint Chief Complaint  Patient presents with  . Vaginal Itching  . Vaginal Discharge    HPI Darlene Livingston is a 28 y.o. female no contributing past medical history presenting today for evaluation of vaginal irritation and discharge.  Patient states that for the past 1 week she has had thick white vaginal discharge along with itching and irritation.  States that externally she looked very inflamed and red.  She tried over-the-counter Monistat 3-day treatment which relieved some of the inflammation, but still feels most of her symptoms.  She does report a new partner recently and does wish to also be checked for STDs.  Patient uses Mirena and has irregular spotting with this, never has full cycle.  Has had urinary frequency, but denies dysuria or urgency.  HPI  Past Medical History:  Diagnosis Date  . Chlamydia   . Headache(784.0)   . Miscarriage    x2, one at 13weeks, one at [redacted] weeks gestation    Patient Active Problem List   Diagnosis Date Noted  . Chlamydia infection 03/07/2018  . Labor and delivery indication for care or intervention 08/31/2014  . Decreased fetal movement determined by examination   . [redacted] weeks gestation of pregnancy   . Traumatic injury during pregnancy   . [redacted] weeks gestation of pregnancy   . Vaginal lesion 02/16/2012    Past Surgical History:  Procedure Laterality Date  . NO PAST SURGERIES      OB History    Gravida  4   Para  2   Term  2   Preterm  0   AB  2   Living  2     SAB  2   TAB  0   Ectopic  0   Multiple  0   Live Births  2            Home Medications    Prior to Admission medications   Medication Sig Start Date End Date Taking? Authorizing Provider  fluconazole (DIFLUCAN) 150 MG tablet Take 1 tablet (150 mg total) by mouth once for 1 dose. 10/31/18 10/31/18  Wendi Lastra C, PA-C  ibuprofen  (ADVIL,MOTRIN) 600 MG tablet Take 1 tablet (600 mg total) by mouth every 6 (six) hours as needed. 10/26/17   Domenic Moras, PA-C  metroNIDAZOLE (FLAGYL) 500 MG tablet Take 1 tablet (500 mg total) by mouth 2 (two) times daily for 7 days. 10/31/18 11/07/18  Rolfe Hartsell, Elesa Hacker, PA-C    Family History Family History  Problem Relation Age of Onset  . Hypertension Mother   . Depression Maternal Aunt   . Diabetes Maternal Aunt   . Depression Maternal Uncle   . Diabetes Maternal Uncle   . Asthma Paternal Aunt   . Cancer Paternal Aunt   . Depression Paternal Aunt   . Diabetes Paternal Aunt   . Asthma Paternal Uncle   . Depression Paternal Uncle   . Diabetes Paternal Uncle   . Birth defects Cousin   . Anesthesia problems Neg Hx   . Hypotension Neg Hx   . Malignant hyperthermia Neg Hx   . Pseudochol deficiency Neg Hx     Social History Social History   Tobacco Use  . Smoking status: Never Smoker  . Smokeless tobacco: Never Used  Substance Use Topics  . Alcohol use: No    Comment: social  .  Drug use: No     Allergies   Fruit blend   Review of Systems Review of Systems  Constitutional: Negative for fever.  Respiratory: Negative for shortness of breath.   Cardiovascular: Negative for chest pain.  Gastrointestinal: Negative for abdominal pain, diarrhea, nausea and vomiting.  Genitourinary: Positive for vaginal discharge. Negative for dysuria, flank pain, genital sores, hematuria, menstrual problem, vaginal bleeding and vaginal pain.  Musculoskeletal: Negative for back pain.  Skin: Negative for rash.  Neurological: Negative for dizziness, light-headedness and headaches.     Physical Exam Triage Vital Signs ED Triage Vitals [10/31/18 1324]  Enc Vitals Group     BP 119/69     Pulse Rate 81     Resp 14     Temp 98.3 F (36.8 C)     Temp src      SpO2 100 %     Weight      Height      Head Circumference      Peak Flow      Pain Score 4     Pain Loc      Pain Edu?       Excl. in [place]?    No data found.  Updated Vital Signs BP 119/69 (BP Location: Right Arm)   Pulse 81   Temp 98.3 F (36.8 C)   Resp 14   LMP 10/09/2018   SpO2 100%   Visual Acuity Right Eye Distance:   Left Eye Distance:   Bilateral Distance:    Right Eye Near:   Left Eye Near:    Bilateral Near:     Physical Exam Vitals signs and nursing note reviewed.  Constitutional:      Appearance: She is well-developed.     Comments: No acute distress  HENT:     Head: Normocephalic and atraumatic.     Nose: Nose normal.  Eyes:     Conjunctiva/sclera: Conjunctivae normal.  Neck:     Musculoskeletal: Neck supple.  Cardiovascular:     Rate and Rhythm: Normal rate.  Pulmonary:     Effort: Pulmonary effort is normal. No respiratory distress.  Abdominal:     General: There is no distension.  Musculoskeletal: Normal range of motion.  Skin:    General: Skin is warm and dry.  Neurological:     Mental Status: She is alert and oriented to person, place, and time.      UC Treatments / Results  Labs (all labs ordered are listed, but only abnormal results are displayed) Labs Reviewed  POCT URINALYSIS DIP (DEVICE) - Abnormal; Notable for the following components:      Result Value   Hgb urine dipstick TRACE (*)    All other components within normal limits  POC URINE PREG, ED  POCT PREGNANCY, URINE  CERVICOVAGINAL ANCILLARY ONLY    EKG   Radiology No results found.  Procedures Procedures (including critical care time)  Medications Ordered in UC Medications - No data to display  Initial Impression / Assessment and Plan / UC Course  I have reviewed the triage vital signs and the nursing notes.  Pertinent labs & imaging results that were available during my care of the patient were reviewed by me and considered in my medical decision making (see chart for details).     Patient likely with vulvovaginitis secondary to yeast versus BV.  Will empirically treat for both  given history.  Deferring empiric treatment for STDs.  Swab obtained to send off to confirm  cause as well as check for STDs.  Will provide further treatment if needed based off results.  Pregnancy test negative, UA unremarkable.Discussed strict return precautions. Patient verbalized understanding and is agreeable with plan.  Final Clinical Impressions(s) / UC Diagnoses   Final diagnoses:  Vaginitis and vulvovaginitis     Discharge Instructions     Begin taking metronidazole twice daily for the next week, this will treat for bacterial vaginosis, please take with food Take 1 tablet of Diflucan today, may repeat second tablet after finishing course of metronidazole/Flagyl  We are testing you for Gonorrhea, Chlamydia, Trichomonas, Yeast and Bacterial Vaginosis. We will call you if anything is positive and let you know if you require any further treatment. Please inform partners of any positive results.   Please return if symptoms not improving with treatment, development of fever, nausea, vomiting, abdominal pain.     ED Prescriptions    Medication Sig Dispense Auth. Provider   metroNIDAZOLE (FLAGYL) 500 MG tablet Take 1 tablet (500 mg total) by mouth 2 (two) times daily for 7 days. 14 tablet Lylian [place] C, PA-C   fluconazole (DIFLUCAN) 150 MG tablet Take 1 tablet (150 mg total) by mouth once for 1 dose. 2 tablet Delsin Copen, [place] C, PA-C     PDMP not reviewed this encounter.   Janith Lima, PA-C 10/31/18 1431

## 2018-11-04 LAB — CERVICOVAGINAL ANCILLARY ONLY
Bacterial vaginitis: POSITIVE — AB
Candida vaginitis: NEGATIVE
Chlamydia: NEGATIVE
Neisseria Gonorrhea: NEGATIVE
Trichomonas: NEGATIVE

## 2019-06-01 ENCOUNTER — Encounter (HOSPITAL_COMMUNITY): Payer: Self-pay

## 2019-06-01 ENCOUNTER — Ambulatory Visit (HOSPITAL_COMMUNITY)
Admission: EM | Admit: 2019-06-01 | Discharge: 2019-06-01 | Disposition: A | Discharge status: Home / Self Care | Payer: 59 | Attending: Family Medicine | Admitting: Family Medicine

## 2019-06-01 ENCOUNTER — Other Ambulatory Visit: Payer: Self-pay

## 2019-06-01 DIAGNOSIS — N898 Other specified noninflammatory disorders of vagina: Secondary | ICD-10-CM | POA: Diagnosis present

## 2019-06-01 DIAGNOSIS — R1032 Left lower quadrant pain: Secondary | ICD-10-CM | POA: Diagnosis present

## 2019-06-01 LAB — POCT URINALYSIS DIP (DEVICE)
Glucose, UA: NEGATIVE mg/dL
Hgb urine dipstick: NEGATIVE
Leukocytes,Ua: NEGATIVE
Nitrite: NEGATIVE
Protein, ur: NEGATIVE mg/dL
Specific Gravity, Urine: 1.025 (ref 1.005–1.030)
Urobilinogen, UA: 4 mg/dL — ABNORMAL HIGH (ref 0.0–1.0)
pH: 8.5 — ABNORMAL HIGH (ref 5.0–8.0)

## 2019-06-01 LAB — POC URINE PREG, ED: Preg Test, Ur: NEGATIVE

## 2019-06-01 MED ORDER — IBUPROFEN 600 MG PO TABS: 600.0000 mg | ORAL_TABLET | Freq: Four times a day (QID) | ORAL | 0 refills | Status: DC | PRN

## 2019-06-01 NOTE — ED Triage Notes (Signed)
Pt presents with LLQ cramping X 4 days; pt states she needs to go to GYN to get IUD checked.

## 2019-06-01 NOTE — ED Provider Notes (Signed)
[place]    CSN: [id number] Arrival date & time: 06/01/19  1721      History   Chief Complaint Chief Complaint  Patient presents with  . Abdominal Pain    HPI Darlene Livingston is a 29 y.o. female.   HPI  Patient has some cramping in the suprapubic and left lower quadrant region for the last 4 days.  It is moderate.  It comes and goes. No nausea or vomiting No fever or chills No vaginal discharge No urinary symptoms, dysuria or frequency She thinks the pain is from her IUD She states she is unable to afford to follow-up with her GYN She requests testing for STDs  Past Medical History:  Diagnosis Date  . Chlamydia   . Headache(784.0)   . Miscarriage    x2, one at 13weeks, one at [redacted] weeks gestation    Patient Active Problem List   Diagnosis Date Noted  . Chlamydia infection 03/07/2018  . Labor and delivery indication for care or intervention 08/31/2014  . Decreased fetal movement determined by examination   . [redacted] weeks gestation of pregnancy   . Traumatic injury during pregnancy   . [redacted] weeks gestation of pregnancy   . Vaginal lesion 02/16/2012    Past Surgical History:  Procedure Laterality Date  . NO PAST SURGERIES      OB History    Gravida  4   Para  2   Term  2   Preterm  0   AB  2   Living  2     SAB  2   TAB  0   Ectopic  0   Multiple  0   Live Births  2            Home Medications    Prior to Admission medications   Medication Sig Start Date End Date Taking? Authorizing Provider  ibuprofen (ADVIL) 600 MG tablet Take 1 tablet (600 mg total) by mouth every 6 (six) hours as needed. 06/01/19   Raylene Everts, MD    Family History Family History  Problem Relation Age of Onset  . Hypertension Mother   . Depression Maternal Aunt   . Diabetes Maternal Aunt   . Depression Maternal Uncle   . Diabetes Maternal Uncle   . Asthma Paternal Aunt   . Cancer Paternal Aunt   . Depression Paternal Aunt   . Diabetes  Paternal Aunt   . Asthma Paternal Uncle   . Depression Paternal Uncle   . Diabetes Paternal Uncle   . Birth defects Cousin   . Anesthesia problems Neg Hx   . Hypotension Neg Hx   . Malignant hyperthermia Neg Hx   . Pseudochol deficiency Neg Hx     Social History Social History   Tobacco Use  . Smoking status: Never Smoker  . Smokeless tobacco: Never Used  Substance Use Topics  . Alcohol use: No    Comment: social  . Drug use: No     Allergies   Fruit blend   Review of Systems Review of Systems  Constitutional: Negative for chills and fever.  Gastrointestinal: Negative for nausea and vomiting.  Genitourinary: Positive for pelvic pain. Negative for dysuria, frequency, menstrual problem and vaginal discharge.     Physical Exam Triage Vital Signs ED Triage Vitals  Enc Vitals Group     BP 06/01/19 1815 117/68     Pulse Rate 06/01/19 1815 77     Resp 06/01/19 1815 18  Temp 06/01/19 1815 98.8 F (37.1 C)     Temp Source 06/01/19 1815 Oral     SpO2 06/01/19 1815 100 %     Weight --      Height --      Head Circumference --      Peak Flow --      Pain Score 06/01/19 1814 6     Pain Loc --      Pain Edu? --      Excl. in [place]? --    No data found.  Updated Vital Signs BP 117/68 (BP Location: Right Arm)   Pulse 77   Temp 98.8 F (37.1 C) (Oral)   Resp 18   SpO2 100%      Physical Exam Constitutional:      General: She is not in acute distress.    Appearance: She is well-developed.  HENT:     Head: Normocephalic and atraumatic.  Eyes:     Conjunctiva/sclera: Conjunctivae normal.     Pupils: Pupils are equal, round, and reactive to light.  Cardiovascular:     Rate and Rhythm: Normal rate and regular rhythm.  Pulmonary:     Effort: Pulmonary effort is normal. No respiratory distress.     Breath sounds: Normal breath sounds.  Abdominal:     General: Bowel sounds are normal. There is no distension.     Palpations: Abdomen is soft.     Tenderness:  There is no abdominal tenderness.     Comments: Abdomen is flat, soft, nontender  Genitourinary:    Comments: Technique for self swab is discussed Musculoskeletal:        General: Normal range of motion.     Cervical back: Normal range of motion.  Skin:    General: Skin is warm and dry.  Neurological:     Mental Status: She is alert.  Psychiatric:        Mood and Affect: Mood normal.        Behavior: Behavior normal.      UC Treatments / Results  Labs (all labs ordered are listed, but only abnormal results are displayed) Labs Reviewed  POCT URINALYSIS DIP (DEVICE) - Abnormal; Notable for the following components:      Result Value   Bilirubin Urine SMALL (*)    Ketones, ur TRACE (*)    pH 8.5 (*)    Urobilinogen, UA 4.0 (*)    All other components within normal limits  POC URINE PREG, ED  CERVICOVAGINAL ANCILLARY ONLY  Pregnancy test is negative  EKG   Radiology No results found.  Procedures Procedures (including critical care time)  Medications Ordered in UC Medications - No data to display  Initial Impression / Assessment and Plan / UC Course  I have reviewed the triage vital signs and the nursing notes.  Pertinent labs & imaging results that were available during my care of the patient were reviewed by me and considered in my medical decision making (see chart for details).     Urinalysis has low suspicion for cystitis.  We will do a vaginal swab for cultures and treat appropriately. Final Clinical Impressions(s) / UC Diagnoses   Final diagnoses:  Left lower quadrant abdominal pain  Vaginal discharge     Discharge Instructions     Drink more water Take the ibuprofen for pain Check My Chart for test results You will be called if any of the tests are positive    ED Prescriptions    Medication  Sig Dispense Auth. Provider   ibuprofen (ADVIL) 600 MG tablet Take 1 tablet (600 mg total) by mouth every 6 (six) hours as needed. 30 tablet Raylene Everts, MD     PDMP not reviewed this encounter.   Raylene Everts, MD 06/01/19 2101

## 2019-06-01 NOTE — Discharge Instructions (Signed)
Drink more water Take the ibuprofen for pain Check My Chart for test results You will be called if any of the tests are positive

## 2019-06-03 LAB — CERVICOVAGINAL ANCILLARY ONLY
Bacterial Vaginitis (gardnerella): POSITIVE — AB
Chlamydia: NEGATIVE
Comment: NEGATIVE
Comment: NEGATIVE
Comment: NEGATIVE
Comment: NORMAL
Neisseria Gonorrhea: NEGATIVE
Trichomonas: NEGATIVE

## 2019-06-04 ENCOUNTER — Telehealth (HOSPITAL_COMMUNITY): Payer: Self-pay

## 2019-06-04 MED ORDER — METRONIDAZOLE 500 MG PO TABS: 500.0000 mg | ORAL_TABLET | Freq: Two times a day (BID) | ORAL | 0 refills | Status: DC

## 2019-10-07 ENCOUNTER — Other Ambulatory Visit: Payer: Self-pay

## 2019-10-07 ENCOUNTER — Other Ambulatory Visit: Payer: 59

## 2019-10-07 DIAGNOSIS — Z20822 Contact with and (suspected) exposure to covid-19: Secondary | ICD-10-CM

## 2019-10-10 LAB — NOVEL CORONAVIRUS, NAA: SARS-CoV-2, NAA: NOT DETECTED

## 2020-05-24 ENCOUNTER — Ambulatory Visit: Payer: Self-pay | Attending: Critical Care Medicine

## 2020-05-24 DIAGNOSIS — Z20822 Contact with and (suspected) exposure to covid-19: Secondary | ICD-10-CM | POA: Insufficient documentation

## 2020-05-25 LAB — NOVEL CORONAVIRUS, NAA: SARS-CoV-2, NAA: NOT DETECTED

## 2020-07-06 ENCOUNTER — Encounter (HOSPITAL_COMMUNITY): Payer: Self-pay | Admitting: Obstetrics and Gynecology

## 2020-07-06 ENCOUNTER — Other Ambulatory Visit: Payer: Self-pay

## 2020-07-06 ENCOUNTER — Inpatient Hospital Stay (HOSPITAL_COMMUNITY)
Admission: AD | Admit: 2020-07-06 | Discharge: 2020-07-06 | Disposition: A | Discharge status: Home / Self Care | Payer: Medicaid Other | Attending: Obstetrics and Gynecology | Admitting: Obstetrics and Gynecology

## 2020-07-06 DIAGNOSIS — O210 Mild hyperemesis gravidarum: Secondary | ICD-10-CM | POA: Diagnosis not present

## 2020-07-06 DIAGNOSIS — Z3A10 10 weeks gestation of pregnancy: Secondary | ICD-10-CM

## 2020-07-06 DIAGNOSIS — O219 Vomiting of pregnancy, unspecified: Secondary | ICD-10-CM

## 2020-07-06 DIAGNOSIS — Z20822 Contact with and (suspected) exposure to covid-19: Secondary | ICD-10-CM | POA: Insufficient documentation

## 2020-07-06 LAB — COMPREHENSIVE METABOLIC PANEL
ALT: 16 U/L (ref 0–44)
AST: 17 U/L (ref 15–41)
Albumin: 3.9 g/dL (ref 3.5–5.0)
Alkaline Phosphatase: 32 U/L — ABNORMAL LOW (ref 38–126)
Anion gap: 7 (ref 5–15)
BUN: 6 mg/dL (ref 6–20)
CO2: 23 mmol/L (ref 22–32)
Calcium: 8.8 mg/dL — ABNORMAL LOW (ref 8.9–10.3)
Chloride: 105 mmol/L (ref 98–111)
Creatinine, Ser: 0.65 mg/dL (ref 0.44–1.00)
GFR, Estimated: >60 mL/min (ref >60)
Glucose, Bld: 79 mg/dL (ref 70–99)
Potassium: 3.6 mmol/L (ref 3.5–5.1)
Sodium: 135 mmol/L (ref 135–145)
Total Bilirubin: 0.6 mg/dL (ref 0.3–1.2)
Total Protein: 7.1 g/dL (ref 6.5–8.1)

## 2020-07-06 LAB — URINALYSIS, ROUTINE W REFLEX MICROSCOPIC
Bilirubin Urine: NEGATIVE
Glucose, UA: NEGATIVE mg/dL
Hgb urine dipstick: NEGATIVE
Ketones, ur: NEGATIVE mg/dL
Leukocytes,Ua: NEGATIVE
Nitrite: NEGATIVE
Protein, ur: NEGATIVE mg/dL
Specific Gravity, Urine: 1.029 (ref 1.005–1.030)
pH: 6 (ref 5.0–8.0)

## 2020-07-06 LAB — SARS CORONAVIRUS 2 (TAT 6-24 HRS): SARS Coronavirus 2: NEGATIVE

## 2020-07-06 LAB — CBC
HCT: 33.6 % — ABNORMAL LOW (ref 36.0–46.0)
Hemoglobin: 11.4 g/dL — ABNORMAL LOW (ref 12.0–15.0)
MCH: 31.4 pg (ref 26.0–34.0)
MCHC: 33.9 g/dL (ref 30.0–36.0)
MCV: 92.6 fL (ref 80.0–100.0)
Platelets: 295 10*3/uL (ref 150–400)
RBC: 3.63 MIL/uL — ABNORMAL LOW (ref 3.87–5.11)
RDW: 12.7 % (ref 11.5–15.5)
WBC: 4.7 10*3/uL (ref 4.0–10.5)
nRBC: 0 % (ref 0.0–0.2)

## 2020-07-06 LAB — POCT PREGNANCY, URINE: Preg Test, Ur: POSITIVE — AB

## 2020-07-06 MED ORDER — DOXYLAMINE SUCCINATE (SLEEP) 25 MG PO TABS: 25.0000 mg | ORAL_TABLET | Freq: Three times a day (TID) | ORAL | 0 refills | Status: DC | PRN

## 2020-07-06 MED ORDER — PROMETHAZINE HCL 25 MG PO TABS: 25.0000 mg | ORAL_TABLET | Freq: Four times a day (QID) | ORAL | 0 refills | Status: DC | PRN

## 2020-07-06 MED ORDER — LACTATED RINGERS IV BOLUS
1000.0000 mL | Freq: Once | INTRAVENOUS | Status: AC
  Administered 2020-07-06: 1000 mL via INTRAVENOUS

## 2020-07-06 MED ORDER — PYRIDOXINE HCL 25 MG PO TABS: 25.0000 mg | ORAL_TABLET | Freq: Three times a day (TID) | ORAL | 0 refills | Status: DC

## 2020-07-06 MED ORDER — ONDANSETRON HCL 4 MG/2ML IJ SOLN
4.0000 mg | Freq: Once | INTRAMUSCULAR | Status: AC
  Administered 2020-07-06: 4 mg via INTRAVENOUS
  Filled 2020-07-06: qty 2

## 2020-07-06 NOTE — Discharge Instructions (Signed)
Vomiting in First Trimester Follow these instructions at home: To help relieve your symptoms, listen to your body. Everyone is different and has different preferences. Find what works best for you. Here are some things you can try to help relieve your symptoms: Meals and snacks Eat 5-6 small meals daily instead of 3 large meals. Eating small meals and snacks can help you avoid an empty stomach. Before getting out of bed, eat a couple of crackers to avoid moving around on an empty stomach. Eat a protein-rich snack before bed. Examples include cheese and crackers, or a peanut butter sandwich made with 1 slice of whole-wheat bread and 1 tsp (5 g) of peanut butter. Eat and drink slowly. Try eating starchy foods as these are usually tolerated well. Examples include cereal, toast, bread, potatoes, pasta, rice, and pretzels. Eat at least one serving of protein with your meals and snacks. Protein options include lean meats, poultry, seafood, beans, nuts, nut butters, eggs, cheese, and yogurt. Eat or suck on things that have ginger in them. It may help to relieve nausea. Add  tsp (0.44 g) ground ginger to hot tea, or choose ginger tea.   Fluids It is important to stay hydrated. Try to: Drink small amounts of fluids often. Drink fluids 30 minutes before or after a meal to help lessen the feeling of a full stomach. Drink 100% fruit juice or an electrolyte drink. An electrolyte drink contains sodium, potassium, and chloride. Drink fluids that are cold, clear, and carbonated or sour. These include lemonade, ginger ale, lemon-lime soda, ice water, and sparkling water. Things to avoid Avoid the following: Eating foods that trigger your symptoms. These may include spicy foods, coffee, high-fat foods, very sweet foods, and acidic foods. Drinking more than 1 cup of fluid at a time. Skipping meals. Nausea can be more intense on an empty stomach. If you cannot tolerate food, do not force it. Try sucking on ice  chips or other frozen items and make up for missed calories later. Lying down within 2 hours after eating. Being exposed to environmental triggers. These may include food smells, smoky rooms, closed spaces, rooms with strong smells, warm or humid places, overly loud and noisy rooms, and rooms with motion or flickering lights. Try eating meals in a well-ventilated area that is free of strong smells. Making quick and sudden changes in your movement. Taking iron pills and multivitamins that contain iron. If you take prescription iron pills, do not stop taking them unless your health care provider approves. Preparing food. The smell of food can spoil your appetite or trigger nausea. General instructions Brush your teeth or use a mouth rinse after meals. Take over-the-counter and prescription medicines only as told by your health care provider. Follow instructions from your health care provider about eating or drinking restrictions. Talk with your health care provider about starting a supplement of vitamin B6. Continue to take your prenatal vitamins as told by your health care provider. If you are having trouble taking your prenatal vitamins, talk with your health care provider about other options. Keep all follow-up visits. This is important. Follow-up visits include prenatal visits. Contact a health care provider if: You have pain in your abdomen. You have a severe headache. You have vision problems. You are losing weight. You feel weak or dizzy. You cannot eat or drink without vomiting, especially if this goes on for a full day. Get help right away if: You cannot drink fluids without vomiting. You vomit blood. You have constant   nausea and vomiting. You are very weak. You faint. You have a fever and your symptoms suddenly get worse. Summary Making some changes to your eating habits may help relieve nausea and vomiting. This condition may be managed with lifestyle changes and medicines as  prescribed by your health care provider. If medicines do not help relieve nausea and vomiting, you may need to receive fluids through an IV at the hospital. This information is not intended to replace advice given to you by your health care provider. Make sure you discuss any questions you have with your health care provider. Document Revised: 08/03/2019 Document Reviewed: 08/03/2019 Elsevier Patient Education  2021 [employer].

## 2020-07-06 NOTE — MAU Note (Signed)
Called Main [facility] to check on status of labs that were sent at 1547. Ulyess Mort stated she would run them now.

## 2020-07-06 NOTE — MAU Provider Note (Signed)
[place]     CSN: [id number]  Arrival date and time: 07/06/20 1439   Event Date/Time   First Provider Initiated Contact with Patient 07/06/20 1536      Chief Complaint  Patient presents with   Nausea   Emesis   Possible Pregnancy   HPI Darlene Livingston is a 30 y.o. [id number] at [redacted]w[redacted]d by uncertain LMP who presents to MAU with chief complaint of recurrent nausea and vomiting as well as generalized fatigue and dizziness.  These are new problems, onset within the past few days. She estimates vomiting 3-4 times per day and had to leave work early today due to feeling weak and overheated. She does not have access to antiemetics. She has not taken any medication or tried other treatments for this complaint.  Patient denies abdominal pain, vaginal bleeding, dysuria, fever or recent illness.  24 hour diet recall includes a few [employer] potato chips, small sips of water, and Gatorade.   OB History     Gravida  5   Para  2   Term  2   Preterm  0   AB  2   Living  2      SAB  2   IAB  0   Ectopic  0   Multiple  0   Live Births  2           Past Medical History:  Diagnosis Date   Chlamydia    Headache(784.0)    Miscarriage    x2, one at 13weeks, one at [redacted] weeks gestation    Past Surgical History:  Procedure Laterality Date   NO PAST SURGERIES      Family History  Problem Relation Age of Onset   Hypertension Mother    Depression Maternal Aunt    Diabetes Maternal Aunt    Depression Maternal Uncle    Diabetes Maternal Uncle    Asthma Paternal Aunt    Cancer Paternal Aunt    Depression Paternal Aunt    Diabetes Paternal Aunt    Asthma Paternal Uncle    Depression Paternal Uncle    Diabetes Paternal Uncle    Birth defects Cousin    Anesthesia problems Neg Hx    Hypotension Neg Hx    Malignant hyperthermia Neg Hx    Pseudochol deficiency Neg Hx     Social History   Tobacco Use   Smoking status: Never   Smokeless tobacco: Never  Vaping Use    Vaping Use: Never used  Substance Use Topics   Alcohol use: No    Comment: social   Drug use: No    Allergies:  Allergies  Allergen Reactions   Fruit Blend Other (See Comments)    Sore throat from eating acidic fresh fruits    Medications Prior to Admission  Medication Sig Dispense Refill Last Dose   ibuprofen (ADVIL) 600 MG tablet Take 1 tablet (600 mg total) by mouth every 6 (six) hours as needed. 30 tablet 0    metroNIDAZOLE (FLAGYL) 500 MG tablet Take 1 tablet (500 mg total) by mouth 2 (two) times daily. 14 tablet 0     Review of Systems  Gastrointestinal:  Negative for abdominal pain.  Genitourinary:  Negative for vaginal bleeding.  All other systems reviewed and are negative. Physical Exam   Blood pressure 135/77, pulse 73, temperature 98.4 F (36.9 C), temperature source Oral, resp. rate 16, height 5\' 7"  (1.702 m), weight 81.1 kg, last menstrual period 04/21/2020, SpO2 100 %, unknown  if currently breastfeeding.  Physical Exam Vitals and nursing note reviewed. Exam conducted with a chaperone present.  Constitutional:      Appearance: Normal appearance. She is ill-appearing.  Cardiovascular:     Rate and Rhythm: Normal rate.     Pulses: Normal pulses.     Heart sounds: Normal heart sounds.  Pulmonary:     Effort: Pulmonary effort is normal.     Breath sounds: Normal breath sounds.  Abdominal:     General: Bowel sounds are normal.     Tenderness: There is no abdominal tenderness.  Skin:    Capillary Refill: Capillary refill takes less than 2 seconds.  Neurological:     Mental Status: She is alert and oriented to person, place, and time.    MAU Course  Procedures  Orders Placed This Encounter  Procedures   SARS CORONAVIRUS 2 (TAT 6-24 HRS) Nasopharyngeal Nasopharyngeal Swab   Urinalysis, Routine w reflex microscopic Urine, Clean Catch   CBC   Comprehensive metabolic panel   Pregnancy, urine POC   Blood draw with IV start   Discharge patient   Patient  Vitals for the past 24 hrs:  BP Temp Temp src Pulse Resp SpO2 Height Weight  07/06/20 1749 (!) 103/57 -- -- 76 16 100 % -- --  07/06/20 1531 122/75 -- -- 75 -- -- -- --  07/06/20 1500 135/77 98.4 F (36.9 C) Oral 73 16 100 % 5\' 7"  (1.702 m) 81.1 kg   Results for orders placed or performed during the hospital encounter of 07/06/20 (from the past 24 hour(s))  Pregnancy, urine POC     Status: Abnormal   Collection Time: 07/06/20  3:21 PM  Result Value Ref Range   Preg Test, Ur POSITIVE (A) NEGATIVE  Urinalysis, Routine w reflex microscopic     Status: None   Collection Time: 07/06/20  3:22 PM  Result Value Ref Range   Color, Urine YELLOW YELLOW   APPearance CLEAR CLEAR   Specific Gravity, Urine 1.029 1.005 - 1.030   pH 6.0 5.0 - 8.0   Glucose, UA NEGATIVE NEGATIVE mg/dL   Hgb urine dipstick NEGATIVE NEGATIVE   Bilirubin Urine NEGATIVE NEGATIVE   Ketones, ur NEGATIVE NEGATIVE mg/dL   Protein, ur NEGATIVE NEGATIVE mg/dL   Nitrite NEGATIVE NEGATIVE   Leukocytes,Ua NEGATIVE NEGATIVE  CBC     Status: Abnormal   Collection Time: 07/06/20  3:45 PM  Result Value Ref Range   WBC 4.7 4.0 - 10.5 K/uL   RBC 3.63 (L) 3.87 - 5.11 MIL/uL   Hemoglobin 11.4 (L) 12.0 - 15.0 g/dL   HCT 33.6 (L) 36.0 - 46.0 %   MCV 92.6 80.0 - 100.0 fL   MCH 31.4 26.0 - 34.0 pg   MCHC 33.9 30.0 - 36.0 g/dL   RDW 12.7 11.5 - 15.5 %   Platelets 295 150 - 400 K/uL   nRBC 0.0 0.0 - 0.2 %  Comprehensive metabolic panel     Status: Abnormal   Collection Time: 07/06/20  3:45 PM  Result Value Ref Range   Sodium 135 135 - 145 mmol/L   Potassium 3.6 3.5 - 5.1 mmol/L   Chloride 105 98 - 111 mmol/L   CO2 23 22 - 32 mmol/L   Glucose, Bld 79 70 - 99 mg/dL   BUN 6 6 - 20 mg/dL   Creatinine, Ser 0.65 0.44 - 1.00 mg/dL   Calcium 8.8 (L) 8.9 - 10.3 mg/dL   Total Protein 7.1 6.5 - 8.1  g/dL   Albumin 3.9 3.5 - 5.0 g/dL   AST 17 15 - 41 U/L   ALT 16 0 - 44 U/L   Alkaline Phosphatase 32 (L) 38 - 126 U/L   Total Bilirubin  0.6 0.3 - 1.2 mg/dL   GFR, Estimated >60 >60 mL/min   Anion gap 7 5 - 15   Meds ordered this encounter  Medications   ondansetron (ZOFRAN) injection 4 mg   lactated ringers bolus 1,000 mL   pyridOXINE (VITAMIN B-6) 25 MG tablet    Sig: Take 1 tablet (25 mg total) by mouth every 8 (eight) hours.    Dispense:  30 tablet    Refill:  0   doxylamine, Sleep, (UNISOM) 25 MG tablet    Sig: Take 1 tablet (25 mg total) by mouth every 8 (eight) hours as needed.    Dispense:  30 tablet   promethazine (PHENERGAN) 25 MG tablet    Sig: Take 1 tablet (25 mg total) by mouth every 6 (six) hours as needed for nausea or vomiting. For vomiting not relieved by B6 and Unisom    Dispense:  30 tablet    Refill:  0    Assessment and Plan  --30 y.o. [id number] in first trimester --Vomiting responsive to medication given in MAU --Tolerating PO prior to discharge --Discharge home in stable condition   Darlina Rumpf, CNM 07/06/2020, 7:08 PM

## 2020-07-06 NOTE — MAU Note (Signed)
+  HPT  a wk or 2 or ago. Has not been feeling well, very nauseated, has been throwing up.  Keeps going from hot to cold. No pain or bleeding.

## 2020-07-16 ENCOUNTER — Inpatient Hospital Stay (HOSPITAL_COMMUNITY)
Admission: AD | Admit: 2020-07-16 | Discharge: 2020-07-16 | Disposition: A | Discharge status: Home / Self Care | Payer: Medicaid Other | Attending: Obstetrics & Gynecology | Admitting: Obstetrics & Gynecology

## 2020-07-16 ENCOUNTER — Inpatient Hospital Stay (HOSPITAL_COMMUNITY): Payer: Medicaid Other

## 2020-07-16 ENCOUNTER — Encounter (HOSPITAL_COMMUNITY): Payer: Self-pay | Admitting: Obstetrics & Gynecology

## 2020-07-16 ENCOUNTER — Other Ambulatory Visit: Payer: Self-pay

## 2020-07-16 DIAGNOSIS — O208 Other hemorrhage in early pregnancy: Secondary | ICD-10-CM | POA: Diagnosis present

## 2020-07-16 DIAGNOSIS — Z3A08 8 weeks gestation of pregnancy: Secondary | ICD-10-CM | POA: Insufficient documentation

## 2020-07-16 DIAGNOSIS — D259 Leiomyoma of uterus, unspecified: Secondary | ICD-10-CM | POA: Insufficient documentation

## 2020-07-16 DIAGNOSIS — O209 Hemorrhage in early pregnancy, unspecified: Secondary | ICD-10-CM

## 2020-07-16 DIAGNOSIS — O3411 Maternal care for benign tumor of corpus uteri, first trimester: Secondary | ICD-10-CM | POA: Diagnosis not present

## 2020-07-16 DIAGNOSIS — Z79899 Other long term (current) drug therapy: Secondary | ICD-10-CM | POA: Diagnosis not present

## 2020-07-16 DIAGNOSIS — O468X1 Other antepartum hemorrhage, first trimester: Secondary | ICD-10-CM

## 2020-07-16 DIAGNOSIS — O418X10 Other specified disorders of amniotic fluid and membranes, first trimester, not applicable or unspecified: Secondary | ICD-10-CM

## 2020-07-16 DIAGNOSIS — O4691 Antepartum hemorrhage, unspecified, first trimester: Secondary | ICD-10-CM

## 2020-07-16 LAB — CBC
HCT: 31.6 % — ABNORMAL LOW (ref 36.0–46.0)
Hemoglobin: 10.7 g/dL — ABNORMAL LOW (ref 12.0–15.0)
MCH: 31.4 pg (ref 26.0–34.0)
MCHC: 33.9 g/dL (ref 30.0–36.0)
MCV: 92.7 fL (ref 80.0–100.0)
Platelets: 245 10*3/uL (ref 150–400)
RBC: 3.41 MIL/uL — ABNORMAL LOW (ref 3.87–5.11)
RDW: 12.8 % (ref 11.5–15.5)
WBC: 3.5 10*3/uL — ABNORMAL LOW (ref 4.0–10.5)
nRBC: 0 % (ref 0.0–0.2)

## 2020-07-16 LAB — HCG, QUANTITATIVE, PREGNANCY: hCG, Beta Chain, Quant, S: 117584 m[IU]/mL — ABNORMAL HIGH (ref <5)

## 2020-07-16 NOTE — Discharge Instructions (Signed)
Prenatal Care Providers           [facility]  [address] [phone]  [facility]   [address]  [phone]  [facility] @ [facility]       [address] [phone]            [facility] @ [facility]     [address] [phone]          [facility] @ [place]   [address] [phone]  [facility]  [address] [phone]     [facility]  [address]   [phone]     [facility]  Phone: [phone]  [facility]  Phone: [phone]  [facility] Phone: [phone]  [facility] Phone: [phone]  [facility] Phone: [phone]  [facility] Phone: [phone]  [facility]  Phone: [phone] Safe Medications in Pregnancy   Acne: Benzoyl Peroxide Salicylic Acid  Backache/Headache: Tylenol: 2 regular strength every 4 hours OR              2 Extra strength every 6 hours  Colds/Coughs/Allergies: Benadryl (alcohol free) 25 mg every 6 hours as needed Breath right strips Claritin Cepacol throat lozenges Chloraseptic throat spray Cold-Eeze- up to three times per day Cough drops, alcohol free Flonase (by prescription only) Guaifenesin Mucinex Robitussin DM (plain only, alcohol free) Saline nasal spray/drops Sudafed (pseudoephedrine) & Actifed ** use only after [redacted] weeks gestation and if you do not have high blood pressure Tylenol Vicks Vaporub Zinc lozenges Zyrtec   Constipation: Colace Ducolax suppositories Fleet enema Glycerin suppositories Metamucil Milk of magnesia Miralax Senokot Smooth move tea  Diarrhea: Kaopectate Imodium A-D  *NO pepto  Bismol  Hemorrhoids: Anusol Anusol HC Preparation H Tucks  Indigestion: Tums Maalox Mylanta Zantac  Pepcid  Insomnia: Benadryl (alcohol free) 25mg every 6 hours as needed Tylenol PM Unisom, no Gelcaps  Leg Cramps: Tums MagGel  Nausea/Vomiting:  Bonine Dramamine Emetrol Ginger extract Sea bands Meclizine  Nausea medication to take during pregnancy:  Unisom (doxylamine succinate 25 mg tablets) Take one tablet daily at bedtime. If symptoms are not adequately controlled, the dose can be increased to a maximum recommended dose of two tablets daily (1/2 tablet in the morning, 1/2 tablet mid-afternoon and one at bedtime). Vitamin B6 100mg tablets. Take one tablet twice a day (up to 200 mg per day).  Skin Rashes: Aveeno products Benadryl cream or 25mg every 6 hours as needed Calamine Lotion 1% cortisone cream  Yeast infection: Gyne-lotrimin 7 Monistat 7   **If taking multiple medications, please check labels to avoid duplicating the same active ingredients **take medication as directed on the label ** Do not exceed 4000 mg of tylenol in 24 hours **Do not take medications that contain aspirin or ibuprofen    

## 2020-07-16 NOTE — MAU Provider Note (Signed)
History     CSN: [id number]  Arrival date and time: 07/16/20 7628   Event Date/Time   First Provider Initiated Contact with Patient 07/16/20 0825      Chief Complaint  Patient presents with   Vaginal Bleeding   HPI Darlene Livingston is a 30 y.o. [id number] at [redacted]w[redacted]d who presents with vaginal bleeding. She reports she was at work and had a gush of bright red bleeding that saturated her pad. She reports she is still having bright red bleeding. She also reports abdominal pain that she rates a 3/10 and has not tried anything for the pain. She denies any discharge. She has not been seen anywhere for this pregnancy and thinks she is 12 weeks but has an unsure LMP.  OB History     Gravida  5   Para  2   Term  2   Preterm  0   AB  2   Living  2      SAB  2   IAB  0   Ectopic  0   Multiple  0   Live Births  2           Past Medical History:  Diagnosis Date   Chlamydia    Headache(784.0)    Miscarriage    x2, one at 13weeks, one at [redacted] weeks gestation    Past Surgical History:  Procedure Laterality Date   NO PAST SURGERIES      Family History  Problem Relation Age of Onset   Hypertension Mother    Depression Maternal Aunt    Diabetes Maternal Aunt    Depression Maternal Uncle    Diabetes Maternal Uncle    Asthma Paternal Aunt    Cancer Paternal Aunt    Depression Paternal Aunt    Diabetes Paternal Aunt    Asthma Paternal Uncle    Depression Paternal Uncle    Diabetes Paternal Uncle    Birth defects Cousin    Anesthesia problems Neg Hx    Hypotension Neg Hx    Malignant hyperthermia Neg Hx    Pseudochol deficiency Neg Hx     Social History   Tobacco Use   Smoking status: Never   Smokeless tobacco: Never  Vaping Use   Vaping Use: Never used  Substance Use Topics   Alcohol use: No    Comment: social   Drug use: No    Allergies:  Allergies  Allergen Reactions   Fruit Blend Other (See Comments)    Sore throat from eating acidic fresh  fruits    Medications Prior to Admission  Medication Sig Dispense Refill Last Dose   promethazine (PHENERGAN) 25 MG tablet Take 1 tablet (25 mg total) by mouth every 6 (six) hours as needed for nausea or vomiting. For vomiting not relieved by B6 and Unisom 30 tablet 0 07/15/2020 at 1830   doxylamine, Sleep, (UNISOM) 25 MG tablet Take 1 tablet (25 mg total) by mouth every 8 (eight) hours as needed. 30 tablet 0    pyridOXINE (VITAMIN B-6) 25 MG tablet Take 1 tablet (25 mg total) by mouth every 8 (eight) hours. 30 tablet 0     Review of Systems  Constitutional: Negative.  Negative for fatigue and fever.  HENT: Negative.    Respiratory: Negative.  Negative for shortness of breath.   Cardiovascular: Negative.  Negative for chest pain.  Gastrointestinal:  Positive for abdominal pain. Negative for constipation, diarrhea, nausea and vomiting.  Genitourinary:  Positive for vaginal  bleeding. Negative for dysuria and vaginal discharge.  Neurological: Negative.  Negative for dizziness and headaches.  Physical Exam   Blood pressure 135/84, pulse 75, temperature 98.4 F (36.9 C), temperature source Oral, resp. rate 15, last menstrual period 04/21/2020, SpO2 100 %, unknown if currently breastfeeding.  Physical Exam Vitals and nursing note reviewed.  Constitutional:      General: She is not in acute distress.    Appearance: She is well-developed.  HENT:     Head: Normocephalic.  Eyes:     Pupils: Pupils are equal, round, and reactive to light.  Cardiovascular:     Rate and Rhythm: Normal rate and regular rhythm.     Heart sounds: Normal heart sounds.  Pulmonary:     Effort: Pulmonary effort is normal. No respiratory distress.     Breath sounds: Normal breath sounds.  Abdominal:     General: Bowel sounds are normal. There is no distension.     Palpations: Abdomen is soft.     Tenderness: There is no abdominal tenderness.  Genitourinary:    Comments: SSE: small amount of dark red blood in  vault Skin:    General: Skin is warm and dry.  Neurological:     Mental Status: She is alert and oriented to person, place, and time.  Psychiatric:        Mood and Affect: Mood normal.        Behavior: Behavior normal.        Thought Content: Thought content normal.        Judgment: Judgment normal.    MAU Course  Procedures Results for orders placed or performed during the hospital encounter of 07/16/20 (from the past 24 hour(s))  CBC     Status: Abnormal   Collection Time: 07/16/20  8:38 AM  Result Value Ref Range   WBC 3.5 (L) 4.0 - 10.5 K/uL   RBC 3.41 (L) 3.87 - 5.11 MIL/uL   Hemoglobin 10.7 (L) 12.0 - 15.0 g/dL   HCT 31.6 (L) 36.0 - 46.0 %   MCV 92.7 80.0 - 100.0 fL   MCH 31.4 26.0 - 34.0 pg   MCHC 33.9 30.0 - 36.0 g/dL   RDW 12.8 11.5 - 15.5 %   Platelets 245 150 - 400 K/uL   nRBC 0.0 0.0 - 0.2 %  hCG, quantitative, pregnancy     Status: Abnormal   Collection Time: 07/16/20  8:38 AM  Result Value Ref Range   hCG, Beta Chain, Quant, S 117,584 (H) <5 mIU/mL    US OB Comp Less 14 Wks  Result Date: 07/16/2020 CLINICAL DATA:  Pregnant patient with vaginal bleeding. EXAM: OBSTETRIC <14 WK ULTRASOUND TECHNIQUE: Transabdominal ultrasound was performed for evaluation of the gestation as well as the maternal uterus and adnexal regions. COMPARISON:  None. FINDINGS: Intrauterine gestational sac: Single Yolk sac:  Visualized. Embryo:  Visualized. Cardiac Activity: Visualized. Heart Rate: 171 bpm MSD:    mm    w     d CRL:   16.1 mm   8 w 0 d                  [place] EDC: February 25, 2021 Subchorionic hemorrhage: There is a moderate size subchorionic hemorrhage. Maternal uterus/adnexae: The ovaries are normal. A fibroid is located in the anterior right lateral corpus of the uterus measuring 4.7 x 4.6 x 5.5 cm IMPRESSION: 1. Single live IUP. 2. Moderate size subchorionic hemorrhage. 3. 5.5 cm fibroid. Electronically Signed   By:  Dorise Bullion III M.D   On: 07/16/2020 09:43     MDM UA,  UPT CBC, HCG ABO/Rh- A Pos US OB Comp Less 14 weeks with Transvaginal  Reviewed results of subchorionic hemorrhage with patient. Discussed that this is a common finding in the first trimester and does not usually cause problems in the pregnancy like loss or difficulty with development. Reviewed expectations for vaginal bleeding including a small amount possibly for several weeks. Reviewed warning signs of heavy bleeding, saturating a pad in less than an hour, and severe pain as reasons to come back to MAU. Encouraged patient to exercise pelvic rest until 7 days after bleeding stops. Patient verbalized understanding.   Discussed finding of fibroid in her uterus and implications in pregnancy at length.  Assessment and Plan   1. [redacted] weeks gestation of pregnancy   2. Vaginal bleeding affecting early pregnancy   3. Subchorionic hemorrhage of placenta in first trimester, single or unspecified fetus    -Discharge home in stable condition -Vaginal bleeding precautions discussed -Patient advised to follow-up with OB to establish prenatal care, list given -Patient may return to MAU as needed or if her condition were to change or worsen   Wende Mott CNM 07/16/2020, 8:25 AM

## 2020-07-16 NOTE — MAU Note (Signed)
.  Darlene Livingston is a 30 y.o. at [redacted]w[redacted]d here in MAU reporting: vaginal bleeding that started at 0630 this morning. States that she was at work and had a gush of bright red blood that soaked through a pad. Did not notice any clots. Last IC was x3 days ago. Is now having lower abdominal cramping.  Onset of complaint: 0630 Pain score: 3 Vitals:   07/16/20 0814  BP: 128/82  Pulse: 80  Resp: 15  Temp: 98.4 F (36.9 C)  SpO2: 100%     FHT: unable to doppler Lab orders placed from triage:  UA

## 2020-07-19 HISTORY — PX: THERAPEUTIC ABORTION: SHX798

## 2020-07-23 ENCOUNTER — Emergency Department (HOSPITAL_COMMUNITY): Payer: Medicaid Other

## 2020-07-23 ENCOUNTER — Encounter (HOSPITAL_COMMUNITY): Payer: Self-pay | Admitting: Emergency Medicine

## 2020-07-23 ENCOUNTER — Other Ambulatory Visit: Payer: Self-pay

## 2020-07-23 ENCOUNTER — Emergency Department (HOSPITAL_COMMUNITY)
Admission: EM | Admit: 2020-07-23 | Discharge: 2020-07-24 | Disposition: A | Discharge status: Home / Self Care | Payer: Medicaid Other | Attending: Emergency Medicine | Admitting: Emergency Medicine

## 2020-07-23 DIAGNOSIS — M791 Myalgia, unspecified site: Secondary | ICD-10-CM | POA: Diagnosis not present

## 2020-07-23 DIAGNOSIS — R519 Headache, unspecified: Secondary | ICD-10-CM | POA: Insufficient documentation

## 2020-07-23 DIAGNOSIS — Z20822 Contact with and (suspected) exposure to covid-19: Secondary | ICD-10-CM | POA: Insufficient documentation

## 2020-07-23 DIAGNOSIS — R5383 Other fatigue: Secondary | ICD-10-CM | POA: Diagnosis not present

## 2020-07-23 DIAGNOSIS — R1031 Right lower quadrant pain: Secondary | ICD-10-CM | POA: Diagnosis not present

## 2020-07-23 DIAGNOSIS — R000 Tachycardia, unspecified: Secondary | ICD-10-CM | POA: Insufficient documentation

## 2020-07-23 DIAGNOSIS — O0489 (Induced) termination of pregnancy with other complications: Secondary | ICD-10-CM | POA: Insufficient documentation

## 2020-07-23 DIAGNOSIS — R569 Unspecified convulsions: Secondary | ICD-10-CM | POA: Insufficient documentation

## 2020-07-23 DIAGNOSIS — O035 Genital tract and pelvic infection following complete or unspecified spontaneous abortion: Secondary | ICD-10-CM

## 2020-07-23 DIAGNOSIS — R509 Fever, unspecified: Secondary | ICD-10-CM | POA: Insufficient documentation

## 2020-07-23 DIAGNOSIS — R102 Pelvic and perineal pain: Secondary | ICD-10-CM | POA: Insufficient documentation

## 2020-07-23 DIAGNOSIS — Z9889 Other specified postprocedural states: Secondary | ICD-10-CM

## 2020-07-23 LAB — CBC WITH DIFFERENTIAL/PLATELET
Abs Immature Granulocytes: 0 10*3/uL (ref 0.00–0.07)
Band Neutrophils: 5 %
Basophils Absolute: 0 10*3/uL (ref 0.0–0.1)
Basophils Relative: 0 %
Eosinophils Absolute: 0 10*3/uL (ref 0.0–0.5)
Eosinophils Relative: 0 %
HCT: 32.5 % — ABNORMAL LOW (ref 36.0–46.0)
Hemoglobin: 10.8 g/dL — ABNORMAL LOW (ref 12.0–15.0)
Lymphocytes Relative: 7 %
Lymphs Abs: 0.3 10*3/uL — ABNORMAL LOW (ref 0.7–4.0)
MCH: 31.1 pg (ref 26.0–34.0)
MCHC: 33.2 g/dL (ref 30.0–36.0)
MCV: 93.7 fL (ref 80.0–100.0)
Monocytes Absolute: 0 10*3/uL — ABNORMAL LOW (ref 0.1–1.0)
Monocytes Relative: 0 %
Neutro Abs: 3.4 10*3/uL (ref 1.7–7.7)
Neutrophils Relative %: 88 %
Platelets: 181 10*3/uL (ref 150–400)
RBC: 3.47 MIL/uL — ABNORMAL LOW (ref 3.87–5.11)
RDW: 13.1 % (ref 11.5–15.5)
WBC: 3.7 10*3/uL — ABNORMAL LOW (ref 4.0–10.5)
nRBC: 0 % (ref 0.0–0.2)
nRBC: 1 /100 WBC — ABNORMAL HIGH

## 2020-07-23 LAB — BASIC METABOLIC PANEL
Anion gap: 8 (ref 5–15)
BUN: 5 mg/dL — ABNORMAL LOW (ref 6–20)
CO2: 20 mmol/L — ABNORMAL LOW (ref 22–32)
Calcium: 8.6 mg/dL — ABNORMAL LOW (ref 8.9–10.3)
Chloride: 107 mmol/L (ref 98–111)
Creatinine, Ser: 0.68 mg/dL (ref 0.44–1.00)
GFR, Estimated: >60 mL/min (ref >60)
Glucose, Bld: 113 mg/dL — ABNORMAL HIGH (ref 70–99)
Potassium: 3.2 mmol/L — ABNORMAL LOW (ref 3.5–5.1)
Sodium: 135 mmol/L (ref 135–145)

## 2020-07-23 LAB — CBG MONITORING, ED: Glucose-Capillary: 121 mg/dL — ABNORMAL HIGH (ref 70–99)

## 2020-07-23 LAB — URINALYSIS, ROUTINE W REFLEX MICROSCOPIC
Bacteria, UA: NONE SEEN
Bilirubin Urine: NEGATIVE
Glucose, UA: NEGATIVE mg/dL
Ketones, ur: 20 mg/dL — AB
Leukocytes,Ua: NEGATIVE
Nitrite: NEGATIVE
Protein, ur: NEGATIVE mg/dL
Specific Gravity, Urine: 1.006 (ref 1.005–1.030)
pH: 6 (ref 5.0–8.0)

## 2020-07-23 LAB — LACTIC ACID, PLASMA: Lactic Acid, Venous: 2 mmol/L (ref 0.5–1.9)

## 2020-07-23 LAB — RAPID URINE DRUG SCREEN, HOSP PERFORMED
Amphetamines: NOT DETECTED
Barbiturates: NOT DETECTED
Benzodiazepines: NOT DETECTED
Cocaine: NOT DETECTED
Opiates: NOT DETECTED
Tetrahydrocannabinol: POSITIVE — AB

## 2020-07-23 LAB — HCG, QUANTITATIVE, PREGNANCY: hCG, Beta Chain, Quant, S: 4032 m[IU]/mL — ABNORMAL HIGH (ref <5)

## 2020-07-23 LAB — CK: Total CK: 44 U/L (ref 38–234)

## 2020-07-23 LAB — RESP PANEL BY RT-PCR (FLU A&B, COVID) ARPGX2
Influenza A by PCR: NEGATIVE
Influenza B by PCR: NEGATIVE
SARS Coronavirus 2 by RT PCR: NEGATIVE

## 2020-07-23 LAB — I-STAT BETA HCG BLOOD, ED (MC, WL, AP ONLY): I-stat hCG, quantitative: >2000 m[IU]/mL — ABNORMAL HIGH (ref <5)

## 2020-07-23 LAB — MAGNESIUM: Magnesium: 1.5 mg/dL — ABNORMAL LOW (ref 1.7–2.4)

## 2020-07-23 MED ORDER — KETOROLAC TROMETHAMINE 15 MG/ML IJ SOLN
15.0000 mg | Freq: Once | INTRAMUSCULAR | Status: AC
  Administered 2020-07-23: 15 mg via INTRAVENOUS
  Filled 2020-07-23: qty 1

## 2020-07-23 MED ORDER — MAGNESIUM SULFATE 2 GM/50ML IV SOLN
2.0000 g | Freq: Once | INTRAVENOUS | Status: AC
  Administered 2020-07-23: 2 g via INTRAVENOUS
  Filled 2020-07-23: qty 50

## 2020-07-23 MED ORDER — POTASSIUM CHLORIDE CRYS ER 20 MEQ PO TBCR
20.0000 meq | EXTENDED_RELEASE_TABLET | Freq: Once | ORAL | Status: AC
  Administered 2020-07-23: 20 meq via ORAL
  Filled 2020-07-23: qty 1

## 2020-07-23 MED ORDER — LACTATED RINGERS IV BOLUS
1000.0000 mL | Freq: Once | INTRAVENOUS | Status: AC
  Administered 2020-07-23: 1000 mL via INTRAVENOUS

## 2020-07-23 MED ORDER — POTASSIUM CHLORIDE 10 MEQ/100ML IV SOLN: 10.0000 meq | INTRAVENOUS | Status: DC

## 2020-07-23 MED ORDER — METRONIDAZOLE 500 MG PO TABS: 500.0000 mg | ORAL_TABLET | Freq: Two times a day (BID) | ORAL | 0 refills | Status: AC

## 2020-07-23 MED ORDER — FENTANYL CITRATE (PF) 100 MCG/2ML IJ SOLN
50.0000 ug | Freq: Once | INTRAMUSCULAR | Status: AC
  Administered 2020-07-23: 50 ug via INTRAVENOUS
  Filled 2020-07-23: qty 2

## 2020-07-23 MED ORDER — PROCHLORPERAZINE EDISYLATE 10 MG/2ML IJ SOLN
10.0000 mg | Freq: Once | INTRAMUSCULAR | Status: AC
  Administered 2020-07-23: 10 mg via INTRAVENOUS
  Filled 2020-07-23: qty 2

## 2020-07-23 MED ORDER — DIPHENHYDRAMINE HCL 50 MG/ML IJ SOLN
12.5000 mg | Freq: Once | INTRAMUSCULAR | Status: AC
  Administered 2020-07-23: 12.5 mg via INTRAVENOUS
  Filled 2020-07-23: qty 1

## 2020-07-23 MED ORDER — SODIUM CHLORIDE 0.9 % IV SOLN
1.0000 g | Freq: Once | INTRAVENOUS | Status: AC
  Administered 2020-07-23: 1 g via INTRAVENOUS
  Filled 2020-07-23: qty 10

## 2020-07-23 MED ORDER — ACETAMINOPHEN 500 MG PO TABS
1000.0000 mg | ORAL_TABLET | Freq: Once | ORAL | Status: AC
  Administered 2020-07-23: 1000 mg via ORAL
  Filled 2020-07-23: qty 2

## 2020-07-23 MED ORDER — NAPROXEN 500 MG PO TABS: 500.0000 mg | ORAL_TABLET | Freq: Two times a day (BID) | ORAL | 0 refills | Status: DC | PRN

## 2020-07-23 MED ORDER — METRONIDAZOLE 500 MG PO TABS
500.0000 mg | ORAL_TABLET | Freq: Once | ORAL | Status: AC
  Administered 2020-07-23: 500 mg via ORAL
  Filled 2020-07-23: qty 1

## 2020-07-23 MED ORDER — DOXYCYCLINE HYCLATE 100 MG PO TABS
100.0000 mg | ORAL_TABLET | Freq: Once | ORAL | Status: AC
  Administered 2020-07-23: 100 mg via ORAL
  Filled 2020-07-23: qty 1

## 2020-07-23 MED ORDER — DOXYCYCLINE HYCLATE 100 MG PO CAPS: 100.0000 mg | ORAL_CAPSULE | Freq: Two times a day (BID) | ORAL | 0 refills | Status: AC

## 2020-07-23 MED ORDER — IOHEXOL 300 MG/ML  SOLN
100.0000 mL | Freq: Once | INTRAMUSCULAR | Status: AC | PRN
  Administered 2020-07-23: 100 mL via INTRAVENOUS

## 2020-07-23 MED ORDER — SODIUM CHLORIDE 0.9 % IV SOLN: 2000.0000 mg | Freq: Once | INTRAVENOUS | Status: DC

## 2020-07-23 NOTE — ED Provider Notes (Signed)
[facility] EMERGENCY DEPARTMENT Provider Note   CSN: [id number] Arrival date & time: 07/23/20  1217     History Chief Complaint  Patient presents with   Seizures    Darlene Livingston is a 30 y.o. female who recently underwent abortion with D&C for elective termination on 07/19/2020 for 8-week gestation pregnancy.  [facility]  States since that time she is been having chills and subjective fevers as well as progressively worsening right lower abdominal pain that does radiate to the left.  She does have scant vaginal bleeding and vaginal pain. She presents via POV to the emergency department today with concern for "seizure" this morning.  Patient states that she was feeling poorly, with chills, body aches, and abdominal pain this morning.  States her sister presented to her home and while they were lying in her bed in talking, the patient states she suddenly could not longer see her sister or control her body.  According to the patient's sister she was shaking with her whole body for 15 to 20 minutes.  The patient states that she could hear her sister the whole time though she was unable to speak, open her eyes, or control her body.  She was resolved on her own and she continued to rest for the next 45 minutes prior to presenting to the emergency department.  She now has severe frontal headache which is exacerbated with exposure to light as well as with movement of the eyes.  Interview and exam complicated by patient's somnolence. I personally reviewed this patient's medical she has history of miscarriage in the past, headaches, sexually transmitted infection.  She is not on any medications daily.  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   Headache(784.0)    Miscarriage    x2, one at 13weeks, one at [redacted] weeks gestation    Patient Active Problem  List   Diagnosis Date Noted   Status post therapeutic abortion on 07/19/20 07/23/2020    Past Surgical History:  Procedure Laterality Date   THERAPEUTIC ABORTION  07/19/2020   Done at [facility], [redacted] weeks gestation     OB History     Gravida  5   Para  2   Term  2   Preterm  0   AB  3   Living  2      SAB  2   IAB  1   Ectopic  0   Multiple  0   Live Births  2           Family History  Problem Relation Age of Onset   Hypertension Mother    Depression Maternal Aunt    Diabetes Maternal Aunt    Depression Maternal Uncle    Diabetes Maternal Uncle    Asthma Paternal Aunt    Cancer Paternal Aunt    Depression Paternal Aunt    Diabetes Paternal Aunt    Asthma Paternal Uncle    Depression Paternal Uncle    Diabetes Paternal Uncle    Birth defects Cousin    Anesthesia problems Neg Hx    Hypotension Neg Hx    Malignant hyperthermia Neg Hx    Pseudochol deficiency Neg Hx     Social History   Tobacco Use   Smoking status: Never   Smokeless tobacco: Never  Vaping Use  Vaping Use: Never used  Substance Use Topics   Alcohol use: No    Comment: social   Drug use: No    Home Medications Prior to Admission medications   Medication Sig Start Date End Date Taking? Authorizing Provider  ibuprofen (ADVIL) 200 MG tablet Take 200 mg by mouth every 6 (six) hours as needed for mild pain or headache.   Yes [provider]  promethazine (PHENERGAN) 25 MG tablet Take 1 tablet (25 mg total) by mouth every 6 (six) hours as needed for nausea or vomiting. For vomiting not relieved by B6 and Unisom Patient taking differently: Take 25 mg by mouth every 6 (six) hours as needed for nausea or vomiting. 07/06/20 08/05/20 Yes Mallie Snooks C, CNM    Allergies    Fruit blend  Review of Systems   Review of Systems  Constitutional:  Positive for activity change, appetite change, chills, fatigue and fever.  HENT: Negative.    Eyes:  Positive for  photophobia. Negative for visual disturbance.  Respiratory: Negative.    Cardiovascular: Negative.   Gastrointestinal:  Positive for abdominal pain. Negative for blood in stool, constipation, diarrhea, nausea and vomiting.  Genitourinary:  Positive for dysuria, menstrual problem, pelvic pain, vaginal bleeding and vaginal pain. Negative for decreased urine volume, difficulty urinating, dyspareunia, enuresis, flank pain, frequency, genital sores, hematuria, urgency and vaginal discharge.  Musculoskeletal:  Positive for myalgias, neck pain and neck stiffness.  Skin: Negative.   Neurological:  Positive for headaches.   Physical Exam Updated Vital Signs BP (!) 103/59   Pulse 78   Temp 99.8 F (37.7 C)   Resp 18   LMP 04/21/2020 Comment: Abortion on 07/19/20  SpO2 95%   Breastfeeding Unknown   Physical Exam Vitals and nursing note reviewed.  Constitutional:      Appearance: She is normal weight. She is ill-appearing. She is not toxic-appearing.  HENT:     Head: Normocephalic and atraumatic.     Nose: Nose normal.     Mouth/Throat:     Mouth: Mucous membranes are moist.     Pharynx: Oropharynx is clear. Uvula midline. No oropharyngeal exudate or posterior oropharyngeal erythema.     Tonsils: No tonsillar exudate.  Eyes:     General: Lids are normal. Vision grossly intact.        Right eye: No discharge.        Left eye: No discharge.     Extraocular Movements: Extraocular movements intact.     Conjunctiva/sclera: Conjunctivae normal.     Pupils: Pupils are equal, round, and reactive to light.     Comments: Pain elicited with EOMs, patient poorly participatory in exam due to photophobia and frontal headache.  Neck:     Trachea: Trachea and phonation normal.     Meningeal: Brudzinski's sign and Kernig's sign absent.  Cardiovascular:     Rate and Rhythm: Regular rhythm. Tachycardia present.     Pulses: Normal pulses.     Heart sounds: Normal heart sounds. No murmur  heard. Pulmonary:     Effort: Pulmonary effort is normal. No tachypnea, bradypnea, accessory muscle usage, prolonged expiration or respiratory distress.     Breath sounds: Normal breath sounds. No wheezing or rales.  Chest:     Chest wall: No lacerations, deformity, swelling, tenderness, crepitus or edema.  Abdominal:     General: Bowel sounds are normal. There is no distension.     Palpations: Abdomen is soft.     Tenderness: There is abdominal  tenderness in the right lower quadrant, suprapubic area and left lower quadrant. There is no right CVA tenderness, left CVA tenderness, guarding or rebound.  Musculoskeletal:        General: No deformity.     Cervical back: Normal range of motion and neck supple. No edema, rigidity or crepitus. No spinous process tenderness or muscular tenderness.     Right lower leg: No edema.     Left lower leg: No edema.  Lymphadenopathy:     Cervical: No cervical adenopathy.  Skin:    General: Skin is warm and dry.     Capillary Refill: Capillary refill takes less than 2 seconds.  Neurological:     Mental Status: She is oriented to person, place, and time. Mental status is at baseline. She is lethargic.     Cranial Nerves: Cranial nerves are intact.     Sensory: Sensation is intact.     Motor: Motor function is intact.     Gait: Gait is intact.  Psychiatric:        Mood and Affect: Mood normal.    ED Results / Procedures / Treatments   Labs (all labs ordered are listed, but only abnormal results are displayed) Labs Reviewed  BASIC METABOLIC PANEL - Abnormal; Notable for the following components:      Result Value   Potassium 3.2 (*)    CO2 20 (*)    Glucose, Bld 113 (*)    BUN 5 (*)    Calcium 8.6 (*)    All other components within normal limits  CBC WITH DIFFERENTIAL/PLATELET - Abnormal; Notable for the following components:   WBC 3.7 (*)    RBC 3.47 (*)    Hemoglobin 10.8 (*)    HCT 32.5 (*)    Lymphs Abs 0.3 (*)    Monocytes Absolute 0.0  (*)    nRBC 1 (*)    All other components within normal limits  HCG, QUANTITATIVE, PREGNANCY - Abnormal; Notable for the following components:   hCG, Beta Chain, Quant, S 4,032 (*)    All other components within normal limits  MAGNESIUM - Abnormal; Notable for the following components:   Magnesium 1.5 (*)    All other components within normal limits  LACTIC ACID, PLASMA - Abnormal; Notable for the following components:   Lactic Acid, Venous 2.0 (*)    All other components within normal limits  URINALYSIS, ROUTINE W REFLEX MICROSCOPIC - Abnormal; Notable for the following components:   Hgb urine dipstick SMALL (*)    Ketones, ur 20 (*)    All other components within normal limits  RAPID URINE DRUG SCREEN, HOSP PERFORMED - Abnormal; Notable for the following components:   Tetrahydrocannabinol POSITIVE (*)    All other components within normal limits  CBG MONITORING, ED - Abnormal; Notable for the following components:   Glucose-Capillary 121 (*)    All other components within normal limits  I-STAT BETA HCG BLOOD, ED ([facility], [facility], [facility] ONLY) - Abnormal; Notable for the following components:   I-stat hCG, quantitative >2,000.0 (*)    All other components within normal limits  RESP PANEL BY RT-PCR (FLU A&B, COVID) ARPGX2  CK  LACTIC ACID, PLASMA    EKG EKG Interpretation  Date/Time:  Saturday July 23 2020 12:40:07 EDT Ventricular Rate:  114 PR Interval:  162 QRS Duration: 68 QT Interval:  292 QTC Calculation: 402 R Axis:   36 Text Interpretation: Sinus tachycardia Otherwise normal ECG Confirmed by Madalyn Rob [phone]) on 07/23/2020  4:35:13 PM  Radiology CT Head Wo Contrast  Result Date: 07/23/2020 CLINICAL DATA:  Seizure today.  No prior history of seizures. EXAM: CT HEAD WITHOUT CONTRAST TECHNIQUE: Contiguous axial images were obtained from the base of the skull through the vertex without intravenous contrast. COMPARISON:  None. FINDINGS: Brain: No intracranial hemorrhage, mass  effect, or midline shift. No hydrocephalus. The basilar cisterns are patent. No evidence of territorial infarct or acute ischemia. No extra-axial or intracranial fluid collection. Vascular: No hyperdense vessel or unexpected calcification. Skull: Normal. Negative for fracture or focal lesion. Sinuses/Orbits: Please mucosal thickening throughout the ethmoid air cells with small mucous retention cyst in the left maxillary sinus. No sinus fluid level. Mastoid air cells are clear. Other: None. IMPRESSION: Negative noncontrast head CT. Electronically Signed   By: Keith Rake M.D.   On: 07/23/2020 17:24   US Transvaginal Non-OB  Result Date: 07/23/2020 CLINICAL DATA:  Recent D and C for elective termination at [redacted] weeks gestation with concern for retained products. EXAM: TRANSABDOMINAL AND TRANSVAGINAL ULTRASOUND OF PELVIS TECHNIQUE: Both transabdominal and transvaginal ultrasound examinations of the pelvis were performed. Transabdominal technique was performed for global imaging of the pelvis including uterus, ovaries, adnexal regions, and pelvic cul-de-sac. It was necessary to proceed with endovaginal exam following the transabdominal exam to visualize the ovaries and uterus. COMPARISON:  Early obstetric ultrasound 07/16/2020 FINDINGS: Uterus Measurements: 14.5 x 8.3 x 10.2 cm = volume: 639 mL. Fibroid in the right uterine body measures 5.3 x 3.4 x 5.0 cm Endometrium Thickness: 12 mm. The endometrial myometrial junction is poorly defined. There is no convincing endometrial vascularity. Right ovary Measurements: 3.5 x 2.4 x 1.9 cm = volume: 8.4 mL. Normal appearance, best visualized transabdominally. No adnexal mass Left ovary Measurements: 3.5 x 2.0 x 2.3 cm = volume: 9.6 mL. There is a probable corpus luteal cyst in the left ovary, also seen on prior exam. No adnexal mass. Other findings No abnormal free fluid. IMPRESSION: 1. Endometrial thickness of 12 mm but no endometrial vascularity. Findings are nonspecific  and likely indicate endometrial blood products, although hypovascular retained products of conception cannot be excluded. Consider short-term follow-up pelvic ultrasound as clinically warranted. 2. Probable corpus luteal cyst in the left ovary. 3. Uterine fibroid again seen. Electronically Signed   By: Keith Rake M.D.   On: 07/23/2020 19:30   US Pelvis Complete  Result Date: 07/23/2020 CLINICAL DATA:  Recent D and C for elective termination at [redacted] weeks gestation with concern for retained products. EXAM: TRANSABDOMINAL AND TRANSVAGINAL ULTRASOUND OF PELVIS TECHNIQUE: Both transabdominal and transvaginal ultrasound examinations of the pelvis were performed. Transabdominal technique was performed for global imaging of the pelvis including uterus, ovaries, adnexal regions, and pelvic cul-de-sac. It was necessary to proceed with endovaginal exam following the transabdominal exam to visualize the ovaries and uterus. COMPARISON:  Early obstetric ultrasound 07/16/2020 FINDINGS: Uterus Measurements: 14.5 x 8.3 x 10.2 cm = volume: 639 mL. Fibroid in the right uterine body measures 5.3 x 3.4 x 5.0 cm Endometrium Thickness: 12 mm. The endometrial myometrial junction is poorly defined. There is no convincing endometrial vascularity. Right ovary Measurements: 3.5 x 2.4 x 1.9 cm = volume: 8.4 mL. Normal appearance, best visualized transabdominally. No adnexal mass Left ovary Measurements: 3.5 x 2.0 x 2.3 cm = volume: 9.6 mL. There is a probable corpus luteal cyst in the left ovary, also seen on prior exam. No adnexal mass. Other findings No abnormal free fluid. IMPRESSION: 1. Endometrial thickness of 12 mm  but no endometrial vascularity. Findings are nonspecific and likely indicate endometrial blood products, although hypovascular retained products of conception cannot be excluded. Consider short-term follow-up pelvic ultrasound as clinically warranted. 2. Probable corpus luteal cyst in the left ovary. 3. Uterine fibroid  again seen. Electronically Signed   By: Keith Rake M.D.   On: 07/23/2020 19:30   CT Abdomen Pelvis W Contrast  Result Date: 07/23/2020 CLINICAL DATA:  Right lower quadrant abdominal pain. Recent D and C. EXAM: CT ABDOMEN AND PELVIS WITH CONTRAST TECHNIQUE: Multidetector CT imaging of the abdomen and pelvis was performed using the standard protocol following bolus administration of intravenous contrast. CONTRAST:  141mL OMNIPAQUE IOHEXOL 300 MG/ML  SOLN COMPARISON:  Pelvic ultrasound earlier today. FINDINGS: Lower chest: Right basilar atelectasis. Hepatobiliary: Scattered ill-defined hypodensities include 18 mm subcapsular lesion in the right lobe, 12 mm lesion in the left lobe, and 11 mm in the inferior right lobe, nonspecific, but likely hemangiomas. Partially distended gallbladder. There may be mild gallbladder wall thickening, but no calcified gallstone or pericholecystic fat stranding. Pancreas: No ductal dilatation or inflammation. Spleen: Normal in size without focal abnormality. Adrenals/Urinary Tract: Normal adrenal glands. No hydronephrosis or perinephric edema. Homogeneous renal enhancement. Urinary bladder is physiologically distended without wall thickening. Stomach/Bowel: Normal appendix. Occasional fluid-filled loops of small bowel without obstruction or inflammation small to moderate colonic stool burden. No colonic inflammation Vascular/Lymphatic: Normal caliber abdominal aorta. Patent portal and mesenteric veins. No portal venous or mesenteric gas. No abdominopelvic adenopathy. Reproductive: Hypodense peripherally enhancing fibroid in the right lower uterine body as seen on recent ultrasound. Mild uterine enlargement consistent with recent D and C. No adnexal mass. Other: No free air or free fluid. No abdominopelvic collection. Tiny fat containing umbilical hernia. Musculoskeletal: There are no acute or suspicious osseous abnormalities. IMPRESSION: 1. Occasional fluid-filled loops of small  bowel without obstruction or inflammation, likely reactive. 2. Partially distended gallbladder with possible mild gallbladder wall thickening, but no calcified gallstone or pericholecystic fat stranding. In the setting of right-sided pain, consider further assessment with ultrasound. 3. Scattered ill-defined hypodensities in the liver, nonspecific, but likely hemangiomas. 4. Uterine fibroid as seen on recent ultrasound. 5. Normal appendix. Electronically Signed   By: Keith Rake M.D.   On: 07/23/2020 21:39   CT VENOGRAM HEAD  Result Date: 07/23/2020 CLINICAL DATA:  Headache and recent pregnancy. EXAM: CT VENOGRAM HEAD TECHNIQUE: Venographic phase images of the brain were obtained following the administration of intravenous contrast. Multiplanar reformats and maximum intensity projections were generated. CONTRAST:  161mL OMNIPAQUE IOHEXOL 300 MG/ML  SOLN COMPARISON:  Head CT 07/23/2020 FINDINGS: Superior sagittal sinus: Normal. Straight sinus: Normal. Inferior sagittal sinus, vein of Galen and internal cerebral veins: Normal. Transverse sinuses: Normal. Sigmoid sinuses: Normal. Visualized jugular veins: Normal. IMPRESSION: Normal CT venogram. Electronically Signed   By: Ulyses Jarred M.D.   On: 07/23/2020 20:40    Procedures .Critical Care  Date/Time: 07/23/2020 10:28 PM Performed by: Emeline Darling, PA-C Authorized by: Emeline Darling, PA-C   Critical care provider statement:    Critical care time (minutes):  45   Critical care was time spent personally by me on the following activities:  Discussions with consultants, evaluation of patient's response to treatment, examination of patient, ordering and performing treatments and interventions, ordering and review of laboratory studies, ordering and review of radiographic studies, pulse oximetry, re-evaluation of patient's condition, obtaining history from patient or surrogate and review of old charts   Medications Ordered in  ED Medications  lactated ringers bolus 1,000 mL (0 mLs Intravenous Stopped 07/23/20 1955)  fentaNYL (SUBLIMAZE) injection 50 mcg (50 mcg Intravenous Given 07/23/20 1715)  prochlorperazine (COMPAZINE) injection 10 mg (10 mg Intravenous Given 07/23/20 1717)  diphenhydrAMINE (BENADRYL) injection 12.5 mg (12.5 mg Intravenous Given 07/23/20 1717)  acetaminophen (TYLENOL) tablet 1,000 mg (1,000 mg Oral Given 07/23/20 1717)  magnesium sulfate IVPB 2 g 50 mL (0 g Intravenous Stopped 07/23/20 2116)  doxycycline (VIBRA-TABS) tablet 100 mg (100 mg Oral Given 07/23/20 2115)  metroNIDAZOLE (FLAGYL) tablet 500 mg (500 mg Oral Given 07/23/20 2114)  ketorolac (TORADOL) 15 MG/ML injection 15 mg (15 mg Intravenous Given 07/23/20 2108)  fentaNYL (SUBLIMAZE) injection 50 mcg (50 mcg Intravenous Given 07/23/20 2110)  potassium chloride SA (KLOR-CON) CR tablet 20 mEq (20 mEq Oral Given 07/23/20 2114)  cefTRIAXone (ROCEPHIN) 1 g in sodium chloride 0.9 % 100 mL IVPB (0 g Intravenous Stopped 07/23/20 2141)  lactated ringers bolus 1,000 mL (1,000 mLs Intravenous New Bag/Given 07/23/20 2142)  iohexol (OMNIPAQUE) 300 MG/ML solution 100 mL (100 mLs Intravenous Contrast Given 07/23/20 2020)    ED Course  I have reviewed the triage vital signs and the nursing notes.  Pertinent labs & imaging results that were available during my care of the patient were reviewed by me and considered in my medical decision making (see chart for details).  Clinical Course as of 07/23/20 2228  Sat Jul 23, 2020  1630 Consult to OB/GYN, Dr. Harolyn Rutherford, who is agreeable to plan to proceed with ultrasound and septic work-up as well as clearance for seizure-like activity.  Once she is cleared from a seizure work-up perspective, may be transferred to the MAU.  I will continue to keep her updated throughout the patient's ED course.  I appreciate her collaboration in the care of this patient. [RS]  1955 CT Abdomen Pelvis W Contrast [RS]    Clinical Course User Index [RS]  Mandell Pangborn, Gypsy Balsam, PA-C   MDM Rules/Calculators/A&P                         30 year old female presents with seizure-like episode this morning as well as worsening abdominal pain and fevers at home having recently undergone D&C for elective termination of pregnancy on 6/28.  Differential diagnosis includes but is not limited to retained products of conception concerning for septic abortion PID, ovarian cyst or torsion torsion less likely given chronicity and progressive worsening of pain.   Tachycardic and borderline hypotensive on presentation.  Borderline febrile with temperature 100.1 F.  Cardiopulmonary exam revealed tachycardia, abdominal exam with exquisite tenderness palpation in the right lower quadrant, tenderness palpation the left lower quadrant, as well as suprapubic area; tenderness is primarily in the pelvic region.  Neurologic exam without focal deficit but patient is poorly participatory in the exam, she states secondary to weakness and frontal headache.  Pain elicited with EOMs though.  Basic labs were obtained in triage.  CBC with mild leukopenia of 3.7.   Mild anemia with hemoglobin of 10.8 near patient baseline.  BMP with hypokalemia of 3.2, otherwise unremarkable.  EKG with sinus tachycardia without ischemic changes.  Will administer analgesia as well as headache cocktail.  We will also add on sepsis labs and quantitative hCG, as well as CT head without/CT venogram for further evaluation.  CT venogram for evaluation of severe headache context of hypercoagulable state of pregnancy.  Consult to OB/GYN as above, will medically clear from a seizure work-up standpoint and transferred to  MAU.  Extensive consultation with OB/GYN after Ultrasound.  From OB review of ultrasound images, no retained products of conception.  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  Per OB recommendation we will proceed with dose of Rocephin in the emergency department as well as oral Doxy and Flagyl;  pending completion of work-up, disposition plan is for discharge, patient should be placed on 14 days of twice daily Doxy and Flagyl.  Patient reevaluated at bedside by his provider after administration of analgesic medications with significant improvement in her exam.  Remains tender in the right lower quadrant, otherwise abdominal exam improved from prior.  Discussed again with OB/GYN who states that there is no indication for internal GYN pelvic exam given lack of current vaginal bleeding or discharge.  No indication for transfer to MAU at this time.  I greatly appreciate the collaboration of Dr. Harolyn Rutherford in the care of this patient.  Per attending recommendation we will proceed with CT of the abdomen pelvis in addition to CT venogram for further evaluation abdominal pain prior to discharge.  CT venogram negative.  CT abdomen pelvis with large uterine fibroid stable as well as uterine distention consistent with previous D&C/pregnancy.  Otherwise reassuring.  Regarding abnormal headache, CT scans are reassuring.  Shared decision making with the patient and attending physician regarding role of lumbar puncture given report of neck pain and photosensitivity.  Patient currently denies any current neck pain or stiffness, reports significant improvement photosensitivity and general headache at this time.  After discussion of risks versus benefits of lumbar puncture, patient voiced understanding of her treatment options and expressed wishes to decline lumbar puncture at this time.  From a neurologic perspective, patient's presentation today inconsistent with seizure activity and she has reassuring work-up in the emergency department today.  Will provide outpatient follow-up with neurology.  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  Will discharge with outpatient antibiotic therapy as well as anti-inflammatory medications.  Recommend close OB/GYN follow-up in the outpatient  setting.  Julyssa voiced understanding for medical evaluation and treatment plan.  Each of her questions was answered to her expressed satisfaction.  Strict return precautions were given.  Patient is stable and appropriate for discharge at this time.  This chart was dictated using voice recognition software, Dragon. Despite the best efforts of this provider to proofread and correct errors, errors may still occur which can change documentation meaning.  Final Clinical Impression(s) / ED Diagnoses Final diagnoses:  None    Rx / DC Orders ED Discharge Orders     None        Aura Dials 07/23/20 2228    Lucrezia Starch, MD 07/24/20 [phone]

## 2020-07-23 NOTE — ED Notes (Signed)
Taken to ultrasound at this time by rad tech.

## 2020-07-23 NOTE — ED Notes (Signed)
nss primary fluid at present rocephine is not compatable with lr

## 2020-07-23 NOTE — ED Triage Notes (Signed)
Pt reports abortion on 6/28.  C/o RLQ pain since then.  States her sister reports she had a seizure today lasting approx 15 min.  No previous history of seizures.  Alert and oriented.

## 2020-07-23 NOTE — ED Provider Notes (Signed)
Emergency Medicine Provider Triage Evaluation Note  Darlene Livingston , a 30 y.o. female  was evaluated in triage.  Pt complains of new onset seizure.  Reports seizure occurred approximately.  The seizure was witnessed by sister.  Seizure lasted for approximately 10 to 15 minutes.  Seizure occurred while patient admits she suffered no falls or concerns.  Endorses sleep after waking.  At present patient endorses headache and Eye pain.  Patient also complains of pain to the lower right abdominal quadrant.  Patient reports that she had an abortion 6/28.  Patient was told that she has a cyst on the right ovary.  Patient has had 2 right lower quadrant pain since this procedure.  Pain has gotten progressively worse since then.  Patient is, illicit drug use, or regular medications.  Review of Systems  Positive: Seizure, headache, eye pain Negative: Chest pain, shortness of breath, numbness, weakness, facial asymmetry, slurred  Physical Exam  BP 110/81 (BP Location: Left Arm)   Pulse (!) 134   Temp 100.1 F (37.8 C) (Oral)   Resp 16   LMP 04/21/2020   SpO2 98%  Gen:   Awake, no distress, tearful Resp:  Normal effort  MSK:   Moves extremities without difficulty  Other:  EOM Intact bilaterally.  Pupils PERRL.  No dysarthria or facial asymmetry.  +5 Strength to bilateral upper and lower extremities  Medical Decision Making  Medically screening exam initiated at 12:29 PM.  Appropriate orders placed.  Lars Masson Laraia was informed that the remainder of the evaluation will be completed by another provider, this initial triage assessment does not replace that evaluation, and the importance of remaining in the ED until their evaluation is complete.  The patient appears stable so that the remainder of the work up may be completed by another provider.      Dyann Ruddle 07/23/20 1233    Arnaldo Natal, MD 07/23/20 [phone]

## 2020-07-23 NOTE — Discharge Instructions (Addendum)
You were seen here today for your abdominal pain.  You were diagnosed with some type of endometritis, which is infection inflammation of the lining of your uterus.  You been prescribed 2 types of antibiotics, 1 called doxycycline and the other called Flagyl.  You should take this twice a day for 2 weeks as prescribed for the entire course.  Please cover the doxycycline to make you more sensitive to the sun, and Flagyl can make you very ill if you drink it with alcohol.  You have also prescribed an anti-inflammatory medicine called Naprosyn to take as needed for your pain.  You may alternate this with Tylenol.  Please follow-up with the OB/GYN listed below soon as possible in the outpatient setting  Regarding your headache, and improved significantly with administered pain medication.  Return to the ER if develop any worsening abdominal pain, nausea or vomiting does not stop, vaginal bleeding or discharge, blurred vision, double vision, sudden severe worsening of her headache, or any new severe symptoms.

## 2020-07-23 NOTE — ED Notes (Signed)
To ct

## 2020-07-26 ENCOUNTER — Emergency Department (HOSPITAL_BASED_OUTPATIENT_CLINIC_OR_DEPARTMENT_OTHER)
Admission: EM | Admit: 2020-07-26 | Discharge: 2020-07-26 | Disposition: A | Discharge status: Home / Self Care | Payer: Medicaid Other | Attending: Emergency Medicine | Admitting: Emergency Medicine

## 2020-07-26 ENCOUNTER — Encounter (HOSPITAL_BASED_OUTPATIENT_CLINIC_OR_DEPARTMENT_OTHER): Payer: Self-pay | Admitting: *Deleted

## 2020-07-26 ENCOUNTER — Emergency Department (HOSPITAL_BASED_OUTPATIENT_CLINIC_OR_DEPARTMENT_OTHER): Payer: Medicaid Other

## 2020-07-26 ENCOUNTER — Other Ambulatory Visit: Payer: Self-pay

## 2020-07-26 DIAGNOSIS — R519 Headache, unspecified: Secondary | ICD-10-CM

## 2020-07-26 DIAGNOSIS — E876 Hypokalemia: Secondary | ICD-10-CM | POA: Insufficient documentation

## 2020-07-26 DIAGNOSIS — N939 Abnormal uterine and vaginal bleeding, unspecified: Secondary | ICD-10-CM | POA: Diagnosis not present

## 2020-07-26 DIAGNOSIS — R102 Pelvic and perineal pain: Secondary | ICD-10-CM | POA: Diagnosis not present

## 2020-07-26 DIAGNOSIS — R112 Nausea with vomiting, unspecified: Secondary | ICD-10-CM

## 2020-07-26 LAB — COMPREHENSIVE METABOLIC PANEL
ALT: 22 U/L (ref 0–44)
AST: 22 U/L (ref 15–41)
Albumin: 2.9 g/dL — ABNORMAL LOW (ref 3.5–5.0)
Alkaline Phosphatase: 93 U/L (ref 38–126)
Anion gap: 7 (ref 5–15)
BUN: 7 mg/dL (ref 6–20)
CO2: 24 mmol/L (ref 22–32)
Calcium: 8.2 mg/dL — ABNORMAL LOW (ref 8.9–10.3)
Chloride: 106 mmol/L (ref 98–111)
Creatinine, Ser: 0.63 mg/dL (ref 0.44–1.00)
GFR, Estimated: >60 mL/min (ref >60)
Glucose, Bld: 104 mg/dL — ABNORMAL HIGH (ref 70–99)
Potassium: 2.7 mmol/L — CL (ref 3.5–5.1)
Sodium: 137 mmol/L (ref 135–145)
Total Bilirubin: 1.1 mg/dL (ref 0.3–1.2)
Total Protein: 6.2 g/dL — ABNORMAL LOW (ref 6.5–8.1)

## 2020-07-26 LAB — URINALYSIS, ROUTINE W REFLEX MICROSCOPIC
Glucose, UA: 100 mg/dL — AB
Ketones, ur: 15 mg/dL — AB
Nitrite: NEGATIVE
Protein, ur: 100 mg/dL — AB
Specific Gravity, Urine: 1.02 (ref 1.005–1.030)
pH: 6.5 (ref 5.0–8.0)

## 2020-07-26 LAB — CBC WITH DIFFERENTIAL/PLATELET
Abs Immature Granulocytes: 0 10*3/uL (ref 0.00–0.07)
Basophils Absolute: 0 10*3/uL (ref 0.0–0.1)
Basophils Relative: 0 %
Eosinophils Absolute: 0 10*3/uL (ref 0.0–0.5)
Eosinophils Relative: 0 %
HCT: 29.5 % — ABNORMAL LOW (ref 36.0–46.0)
Hemoglobin: 10.5 g/dL — ABNORMAL LOW (ref 12.0–15.0)
Lymphocytes Relative: 1 %
Lymphs Abs: 0.1 10*3/uL — ABNORMAL LOW (ref 0.7–4.0)
MCH: 31.5 pg (ref 26.0–34.0)
MCHC: 35.6 g/dL (ref 30.0–36.0)
MCV: 88.6 fL (ref 80.0–100.0)
Monocytes Absolute: 0.5 10*3/uL (ref 0.1–1.0)
Monocytes Relative: 5 %
Neutro Abs: 9.9 10*3/uL — ABNORMAL HIGH (ref 1.7–7.7)
Neutrophils Relative %: 94 %
Platelets: 121 10*3/uL — ABNORMAL LOW (ref 150–400)
RBC: 3.33 MIL/uL — ABNORMAL LOW (ref 3.87–5.11)
RDW: 13.4 % (ref 11.5–15.5)
Smear Review: NORMAL
WBC: 10.5 10*3/uL (ref 4.0–10.5)
nRBC: 0 % (ref 0.0–0.2)

## 2020-07-26 LAB — WET PREP, GENITAL
Clue Cells Wet Prep HPF POC: NONE SEEN
Sperm: NONE SEEN
Trich, Wet Prep: NONE SEEN
Yeast Wet Prep HPF POC: NONE SEEN

## 2020-07-26 LAB — HCG, QUANTITATIVE, PREGNANCY: hCG, Beta Chain, Quant, S: 1344 m[IU]/mL — ABNORMAL HIGH (ref <5)

## 2020-07-26 LAB — URINALYSIS, MICROSCOPIC (REFLEX)

## 2020-07-26 LAB — LIPASE, BLOOD: Lipase: 17 U/L (ref 11–51)

## 2020-07-26 MED ORDER — ONDANSETRON 4 MG PO TBDP: 4.0000 mg | ORAL_TABLET | Freq: Three times a day (TID) | ORAL | 0 refills | Status: DC | PRN

## 2020-07-26 MED ORDER — ONDANSETRON HCL 4 MG/2ML IJ SOLN
4.0000 mg | Freq: Once | INTRAMUSCULAR | Status: AC
  Administered 2020-07-26: 4 mg via INTRAVENOUS
  Filled 2020-07-26: qty 2

## 2020-07-26 MED ORDER — SODIUM CHLORIDE 0.9 % IV SOLN: INTRAVENOUS | Status: DC | PRN

## 2020-07-26 MED ORDER — METOCLOPRAMIDE HCL 5 MG/ML IJ SOLN
10.0000 mg | Freq: Once | INTRAMUSCULAR | Status: AC
  Administered 2020-07-26: 10 mg via INTRAVENOUS
  Filled 2020-07-26: qty 2

## 2020-07-26 MED ORDER — POTASSIUM CHLORIDE ER 10 MEQ PO TBCR: 10.0000 meq | EXTENDED_RELEASE_TABLET | Freq: Every day | ORAL | 0 refills | Status: DC

## 2020-07-26 MED ORDER — POTASSIUM CHLORIDE CRYS ER 20 MEQ PO TBCR
40.0000 meq | EXTENDED_RELEASE_TABLET | Freq: Once | ORAL | Status: AC
  Administered 2020-07-26: 40 meq via ORAL
  Filled 2020-07-26: qty 2

## 2020-07-26 MED ORDER — DIPHENHYDRAMINE HCL 50 MG/ML IJ SOLN
50.0000 mg | Freq: Once | INTRAMUSCULAR | Status: AC
  Administered 2020-07-26: 50 mg via INTRAVENOUS
  Filled 2020-07-26: qty 1

## 2020-07-26 MED ORDER — POTASSIUM CHLORIDE 10 MEQ/100ML IV SOLN
10.0000 meq | Freq: Once | INTRAVENOUS | Status: AC
  Administered 2020-07-26: 10 meq via INTRAVENOUS
  Filled 2020-07-26: qty 100

## 2020-07-26 MED ORDER — SODIUM CHLORIDE 0.9 % IV BOLUS
1000.0000 mL | Freq: Once | INTRAVENOUS | Status: AC
  Administered 2020-07-26: 1000 mL via INTRAVENOUS

## 2020-07-26 MED ORDER — MORPHINE SULFATE (PF) 2 MG/ML IV SOLN
2.0000 mg | Freq: Once | INTRAVENOUS | Status: AC
  Administered 2020-07-26: 2 mg via INTRAVENOUS
  Filled 2020-07-26: qty 1

## 2020-07-26 NOTE — ED Provider Notes (Signed)
Care assumed from [place] PA-C at shift change.  See his note for full H&P.    Briefly this is a 30 yo female presenting with vaginal bleeding and vomiting x1 day.  Patient had a history of D&C on 6/28 and presented to an outside ED on 7/2 and had an extensive work-up for headache and full body shaking.  She had a negative head CT and CT venogram.  Patient was also discharged with antibiotics for post aborted endometritis.  Per previous provider patient has no abdominal tenderness, normal neuro exam and is well-appearing. Labs today with slightly lower platelets and hypokalemia. No significant change in H/H.  Patient was given a headache cocktail here and is resting comfortably.  Imaging performed today does not show any retained products of conception.  Plan is to p.o. challenge patient and reassess after IV potassium completed.  Patient already has plans for outpatient follow-up with OB/GYN and neurology.   Physical Exam  BP 116/67 (BP Location: Right Arm)   Pulse 68   Temp 100 F (37.8 C)   Resp (!) 22   Ht 5\' 7"  (1.702 m)   Wt 80.7 kg   LMP 04/26/2020   SpO2 99%   BMI 27.88 kg/m   Physical Exam PE: Constitutional: well-developed, well-nourished, no apparent distress HENT: normocephalic, atraumatic. no cervical adenopathy Cardiovascular: normal rate and rhythm, distal pulses intact Pulmonary/Chest: effort normal; breath sounds clear and equal bilaterally; no wheezes or rales Abdominal: soft and nontender Musculoskeletal: full ROM, no edema Neurological: alert with goal directed thinking Skin: warm and dry, no rash, no diaphoresis Psychiatric: normal mood and affect, normal behavior   ED Course/Procedures    Results for orders placed or performed during the hospital encounter of 07/26/20 (from the past 24 hour(s))  Wet prep, genital     Status: Abnormal   Collection Time: 07/26/20  5:34 PM  Result Value Ref Range   Yeast Wet Prep HPF POC NONE SEEN NONE SEEN   Trich,  Wet Prep NONE SEEN NONE SEEN   Clue Cells Wet Prep HPF POC NONE SEEN NONE SEEN   WBC, Wet Prep HPF POC MANY (A) NONE SEEN   Sperm NONE SEEN   hCG, quantitative, pregnancy     Status: Abnormal   Collection Time: 07/26/20  6:38 PM  Result Value Ref Range   hCG, Beta Chain, Quant, S 1,344 (H) <5 mIU/mL  CBC with Differential     Status: Abnormal   Collection Time: 07/26/20  6:38 PM  Result Value Ref Range   WBC 10.5 4.0 - 10.5 K/uL   RBC 3.33 (L) 3.87 - 5.11 MIL/uL   Hemoglobin 10.5 (L) 12.0 - 15.0 g/dL   HCT 29.5 (L) 36.0 - 46.0 %   MCV 88.6 80.0 - 100.0 fL   MCH 31.5 26.0 - 34.0 pg   MCHC 35.6 30.0 - 36.0 g/dL   RDW 13.4 11.5 - 15.5 %   Platelets 121 (L) 150 - 400 K/uL   nRBC 0.0 0.0 - 0.2 %   Neutrophils Relative % 94 %   Neutro Abs 9.9 (H) 1.7 - 7.7 K/uL   Lymphocytes Relative 1 %   Lymphs Abs 0.1 (L) 0.7 - 4.0 K/uL   Monocytes Relative 5 %   Monocytes Absolute 0.5 0.1 - 1.0 K/uL   Eosinophils Relative 0 %   Eosinophils Absolute 0.0 0.0 - 0.5 K/uL   Basophils Relative 0 %   Basophils Absolute 0.0 0.0 - 0.1 K/uL   WBC Morphology  MORPHOLOGY UNREMARKABLE    RBC Morphology MORPHOLOGY UNREMARKABLE    Smear Review Normal platelet morphology    Abs Immature Granulocytes 0.00 0.00 - 0.07 K/uL  Comprehensive metabolic panel     Status: Abnormal   Collection Time: 07/26/20  6:38 PM  Result Value Ref Range   Sodium 137 135 - 145 mmol/L   Potassium 2.7 (LL) 3.5 - 5.1 mmol/L   Chloride 106 98 - 111 mmol/L   CO2 24 22 - 32 mmol/L   Glucose, Bld 104 (H) 70 - 99 mg/dL   BUN 7 6 - 20 mg/dL   Creatinine, Ser 0.63 0.44 - 1.00 mg/dL   Calcium 8.2 (L) 8.9 - 10.3 mg/dL   Total Protein 6.2 (L) 6.5 - 8.1 g/dL   Albumin 2.9 (L) 3.5 - 5.0 g/dL   AST 22 15 - 41 U/L   ALT 22 0 - 44 U/L   Alkaline Phosphatase 93 38 - 126 U/L   Total Bilirubin 1.1 0.3 - 1.2 mg/dL   GFR, Estimated >60 >60 mL/min   Anion gap 7 5 - 15  Lipase, blood     Status: None   Collection Time: 07/26/20  6:38 PM   Result Value Ref Range   Lipase 17 11 - 51 U/L  Urinalysis, Routine w reflex microscopic Urine, Clean Catch     Status: Abnormal   Collection Time: 07/26/20  7:35 PM  Result Value Ref Range   Color, Urine ORANGE (A) YELLOW   APPearance CLEAR CLEAR   Specific Gravity, Urine 1.020 1.005 - 1.030   pH 6.5 5.0 - 8.0   Glucose, UA 100 (A) NEGATIVE mg/dL   Hgb urine dipstick LARGE (A) NEGATIVE   Bilirubin Urine MODERATE (A) NEGATIVE   Ketones, ur 15 (A) NEGATIVE mg/dL   Protein, ur 100 (A) NEGATIVE mg/dL   Nitrite NEGATIVE NEGATIVE   Leukocytes,Ua TRACE (A) NEGATIVE  Urinalysis, Microscopic (reflex)     Status: Abnormal   Collection Time: 07/26/20  7:35 PM  Result Value Ref Range   RBC / HPF 0-5 0 - 5 RBC/hpf   WBC, UA 6-10 0 - 5 WBC/hpf   Bacteria, UA FEW (A) NONE SEEN   Squamous Epithelial / LPF 0-5 0 - 5   Urine-Other LESS THAN 10 mL OF URINE SUBMITTED    TRANSABDOMINAL AND TRANSVAGINAL ULTRASOUND OF PELVIS     TECHNIQUE:  Both transabdominal and transvaginal ultrasound examinations of the  pelvis were performed. Transabdominal technique was performed for  global imaging of the pelvis including uterus, ovaries, adnexal  regions, and pelvic cul-de-sac. It was necessary to proceed with  endovaginal exam following the transabdominal exam to visualize the  uterus, endometrium, and ovaries.     COMPARISON:  07/23/2020     FINDINGS:  Uterus     Measurements: 13.6 x 8.0 x 7.0 cm = volume: 400 mL. Anteverted.  Heterogeneous myometrium. RIGHT fundal leiomyoma 6.3 x 4.4 x 4.4 cm  with scattered shadowing. No additional mass. Vague hypoechogenicity  at fundus adjacent to the endometrial complex may be related to  prior D&C , approximately 2.4 x 1.7 x 2.8 cm in greatest  dimensions.     Endometrium     Thickness: Inadequately visualized due to large upper uterine  leiomyoma and scattered areas of shadowing. No obvious endometrial  fluid     Right ovary     Not visualized,  likely obscured by bowel     Left ovary     Measurements: 3.6 x  2.1 x 3.0 cm = volume: 12 mL. Normal follow G  without mass     Other findings     No free pelvic fluid or adnexal masses.     IMPRESSION:  6.3 cm diameter RIGHT fundal leiomyoma with scattered shadowing.     Vague area of hypoechogenicity in fundus adjacent endometrial  complex question related to prior D&C.     Nonvisualization of endometrial complex and RIGHT ovary.        Electronically Signed    By: Lavonia Dana M.D.    On: 07/26/2020 18:31    EKG Interpretation  Date/Time:  Tuesday July 26 2020 19:43:22 EDT Ventricular Rate:  73 PR Interval:  175 QRS Duration: 80 QT Interval:  405 QTC Calculation: 447 R Axis:   60 Text Interpretation: Normal sinus rhythm Confirmed by Ronnald Nian, Adam ([id number]) on 07/26/2020 7:46:12 PM          MDM  Patient received in sign out. Please see previous provider note to include MDM up to this point.   Patient reassessed after IV potassium.  She is tolerating p.o. intake here.  Serial abdominal exams are benign.  Patient stable for outpatient follow-up with OB/GYN and neurology.  Will discharge with short course of p.o. potassium and recommend she have it rechecked by PCP when finished. Strict return precautions discussed.    Portions of this note were generated with [employer]. Dictation errors may occur despite best attempts at proofreading.      Barrie Folk, PA-C 07/26/20 2129    Lennice Sites, DO 07/26/20 2154

## 2020-07-26 NOTE — Discharge Instructions (Addendum)
You came to the emerge department today to be evaluated for your vaginal bleeding, nausea/vomiting, and headache.  Your physical exam, lab work, and imaging were reassuring.  Your pelvic ultrasound showed no acute changes.  Your bleeding may be related to your recent dilation and curettage.  Please follow-up with your OB/GYN provider.  Given information to follow-up with neurology regarding your headaches, please schedule an appointment if you continue to have headaches.  I have given you a prescription for Zofran to help with your nausea and vomiting.  You may take 1 pill every 8 hours as needed for nausea and vomiting.  Get help right away if: You are bleeding a lot from your vagina. This means soaking more than one sanitary pad in 1 hour, and this happens for 2 hours in a row. You have a fever that is above 100.60F (38C). Your belly feels very tender or hard. You have chest pain. You have trouble breathing. You feel dizzy or light-headed. You faint. You have pain in your neck or shoulder area.

## 2020-07-26 NOTE — ED Provider Notes (Signed)
[place] EMERGENCY DEPARTMENT Provider Note   CSN: [id number] Arrival date & time: 07/26/20  1625     History Chief Complaint  Patient presents with   Vaginal Bleeding    Darlene Livingston is a 30 y.o. female with a history of headaches.  Patient presents emergency department today with a complaint of vaginal bleeding, nausea/vomiting, pelvic pain, and headache.  Patient reports that headache started on 7/2 after she had an episode of full body shaking.  It is located to the left temple.  She rates pain 10/10 on the pain scale.  Pain is worse with bright lights and loud noises.  Patient has had relief of symptoms with Tylenol.  States that after taking Tylenol yesterday her symptoms completely resolved.  States that she woke up this morning and headache gradually started and has gotten progressively worse throughout the day.  Denies any numbness, weakness, slurred speech, facial asymmetry.  Per chart review patient had extensive work-up after this event at [facility] emergency department including negative CT head and CT venogram.  Patient received outpatient neurology follow-up.  Patient reports that vaginal bleeding started on 7/3.  Patient states that bleeding has been intermittent since then.  At its heaviest patient has had to change her pad every 4 hours.  Patient complains of right pelvic pain.  Pain has been constant.  Patient rates pain 9/10 on the pain scale.  Does not radiate.  No alleviating or aggravating factors.  She also endorses vaginal discharge but is unable to describe discharge.  Patient reports nausea and vomiting.  States that she has vomited 6 times in the last 24 hours.  Emesis described as stomach contents progressing to bilious.  Patient denies any hematemesis or coffee-ground emesis.  Reports that she has had difficulty keeping down any p.o. intake.  Patient was not able to take her prescribed antibiotics today.  Patient denies any fevers, chills, bloody  stools, melena, diarrhea, dysuria, vaginal pain, vaginal pruritus, fatigue, chest pain, shortness of breath, syncope.   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, [redacted] weeks gestation     OB History     Gravida  5   Para  2   Term  2   Preterm  0   AB  3   Living  2      SAB  2   IAB  1   Ectopic  0   Multiple  0   Live Births  2           Family History  Problem Relation Age of Onset   Hypertension Mother    Depression Maternal Aunt    Diabetes Maternal Aunt    Depression Maternal Uncle    Diabetes Maternal Uncle    Asthma Paternal Aunt    Cancer Paternal Aunt    Depression Paternal Aunt  Diabetes Paternal Aunt    Asthma Paternal Uncle    Depression Paternal Uncle    Diabetes Paternal Uncle    Birth defects Cousin    Anesthesia problems Neg Hx    Hypotension Neg Hx    Malignant hyperthermia Neg Hx    Pseudochol deficiency Neg Hx     Social History   Tobacco Use   Smoking status: Never   Smokeless tobacco: Never  Vaping Use   Vaping Use: Never used  Substance Use Topics   Alcohol use: No    Comment: social   Drug use: No    Home Medications Prior to Admission medications   Medication Sig Start Date End Date Taking? Authorizing Provider  doxycycline (VIBRAMYCIN) 100 MG capsule Take 1 capsule (100 mg total) by mouth 2 (two) times daily for 14 days. 07/23/20 08/06/20  Sponseller,  Eugene Garnet R, PA-C  ibuprofen (ADVIL) 200 MG tablet Take 200 mg by mouth every 6 (six) hours as needed for mild pain or headache.    [provider]  metroNIDAZOLE (FLAGYL) 500 MG tablet Take 1 tablet (500 mg total) by mouth 2 (two) times daily for 14 days. 07/23/20 08/06/20  Sponseller, Eugene Garnet R, PA-C  naproxen (NAPROSYN) 500 MG tablet Take 1 tablet (500 mg total) by mouth 2 (two) times daily as needed for moderate pain. 07/23/20   Sponseller, Gypsy Balsam, PA-C  promethazine (PHENERGAN) 25 MG tablet Take 1 tablet (25 mg total) by mouth every 6 (six) hours as needed for nausea or vomiting. For vomiting not relieved by B6 and Unisom Patient taking differently: Take 25 mg by mouth every 6 (six) hours as needed for nausea or vomiting. 07/06/20 08/05/20  Darlina Rumpf, CNM    Allergies    Fruit blend  Review of Systems   Review of Systems  Constitutional:  Negative for chills and fever.  Eyes:  Negative for visual disturbance.  Respiratory:  Negative for shortness of breath.   Cardiovascular:  Negative for chest pain.  Gastrointestinal:  Positive for nausea and vomiting. Negative for abdominal distention, abdominal pain, anal bleeding, blood in stool and diarrhea.  Genitourinary:  Positive for frequency, pelvic pain, vaginal bleeding and vaginal discharge. Negative for difficulty urinating, dysuria, genital sores and vaginal pain.  Musculoskeletal:  Negative for back pain and neck pain.  Skin:  Negative for color change and rash.  Neurological:  Positive for headaches. Negative for dizziness, tremors, seizures, syncope, facial asymmetry, speech difficulty, weakness, light-headedness and numbness.  Psychiatric/Behavioral:  Negative for confusion.    Physical Exam Updated Vital Signs BP 136/88   Pulse 91   Temp 100 F (37.8 C)   Ht 5\' 7"  (1.702 m)   Wt 80.7 kg   LMP 04/26/2020   SpO2 98%   BMI 27.88 kg/m   Physical Exam Vitals and nursing note reviewed. Exam conducted with a  chaperone present (Female RN present as chaperone).  Constitutional:      General: She is not in acute distress.    Appearance: She is not ill-appearing, toxic-appearing or diaphoretic.  HENT:     Head: Normocephalic and atraumatic. No raccoon eyes, abrasion, contusion, masses, right periorbital erythema, left periorbital erythema or laceration.     Jaw: No trismus or pain on movement.     Mouth/Throat:     Pharynx: Oropharynx is clear. Uvula midline. No pharyngeal swelling, oropharyngeal exudate, posterior oropharyngeal erythema or uvula swelling.  Eyes:     General: No scleral icterus.  Right eye: No discharge.        Left eye: No discharge.     Extraocular Movements: Extraocular movements intact.     Pupils: Pupils are equal, round, and reactive to light.  Cardiovascular:     Rate and Rhythm: Normal rate.  Pulmonary:     Effort: Pulmonary effort is normal. No tachypnea, bradypnea or respiratory distress.  Abdominal:     General: Bowel sounds are normal. There is no distension. There are no signs of injury.     Palpations: Abdomen is soft. There is no mass or pulsatile mass.     Tenderness: There is abdominal tenderness in the suprapubic area. There is no guarding or rebound.     Hernia: There is no hernia in the umbilical area, ventral area, left inguinal area or right inguinal area.     Comments: Right-sided suprapubic tenderness small bowel loops in the pelvis  Genitourinary:    Pubic Area: No rash or pubic lice.      Tanner stage (genital): 5.     Labia:        Right: No rash, tenderness, lesion or injury.        Left: No rash, tenderness, lesion or injury.      Vagina: No signs of injury and foreign body. Bleeding present. No vaginal discharge, erythema, tenderness, lesions or prolapsed vaginal walls.     Cervix: Cervical bleeding present. No cervical motion tenderness, discharge, friability, lesion, erythema or eversion.     Uterus: Tender. Not enlarged.      Adnexa:         Right: No mass, tenderness or fullness.         Left: No mass, tenderness or fullness.    Musculoskeletal:     Cervical back: Normal range of motion and neck supple. No rigidity.  Lymphadenopathy:     Lower Body: No right inguinal adenopathy. No left inguinal adenopathy.  Skin:    General: Skin is warm and dry.  Neurological:     General: No focal deficit present.     Mental Status: She is alert and oriented to person, place, and time.     GCS: GCS eye subscore is 4. GCS verbal subscore is 5. GCS motor subscore is 6.     Cranial Nerves: No cranial nerve deficit or facial asymmetry.     Sensory: Sensation is intact.     Motor: No weakness, tremor, seizure activity or pronator drift.     Coordination: Finger-Nose-Finger Test normal.     Comments: CN II-XII intact; performed in supine position, after he ate well, +5 strength to bilateral upper extremities, +5 strength to dorsiflexion and plantarflexion, patient able to left both legs against gravity and hold each there without difficulty, sensation to light touch intact to bilateral upper and lower extremities  Psychiatric:        Behavior: Behavior is cooperative.    ED Results / Procedures / Treatments   Labs (all labs ordered are listed, but only abnormal results are displayed) Labs Reviewed  WET PREP, GENITAL - Abnormal; Notable for the following components:      Result Value   WBC, Wet Prep HPF POC MANY (*)    All other components within normal limits  HCG, QUANTITATIVE, PREGNANCY - Abnormal; Notable for the following components:   hCG, Beta Chain, Quant, S 1,344 (*)    All other components within normal limits  CBC WITH DIFFERENTIAL/PLATELET - Abnormal; Notable for the following components:   RBC  3.33 (*)    Hemoglobin 10.5 (*)    HCT 29.5 (*)    Platelets 121 (*)    Neutro Abs 9.9 (*)    Lymphs Abs 0.1 (*)    All other components within normal limits  URINALYSIS, ROUTINE W REFLEX MICROSCOPIC - Abnormal; Notable for  the following components:   Color, Urine ORANGE (*)    Glucose, UA 100 (*)    Hgb urine dipstick LARGE (*)    Bilirubin Urine MODERATE (*)    Ketones, ur 15 (*)    Protein, ur 100 (*)    Leukocytes,Ua TRACE (*)    All other components within normal limits  COMPREHENSIVE METABOLIC PANEL - Abnormal; Notable for the following components:   Potassium 2.7 (*)    Glucose, Bld 104 (*)    Calcium 8.2 (*)    Total Protein 6.2 (*)    Albumin 2.9 (*)    All other components within normal limits  URINALYSIS, MICROSCOPIC (REFLEX) - Abnormal; Notable for the following components:   Bacteria, UA FEW (*)    All other components within normal limits  LIPASE, BLOOD  GC/CHLAMYDIA PROBE AMP ([place]) NOT AT [facility]    EKG EKG Interpretation  Date/Time:  Tuesday July 26 2020 19:43:22 EDT Ventricular Rate:  73 PR Interval:  175 QRS Duration: 80 QT Interval:  405 QTC Calculation: 447 R Axis:   60 Text Interpretation: Normal sinus rhythm Confirmed by Lennice Sites ([id number]) on 07/26/2020 7:46:12 PM  Radiology US Transvaginal Non-OB  Result Date: 07/26/2020 CLINICAL DATA:  Vaginal bleeding, follow-up, recent D&C EXAM: TRANSABDOMINAL AND TRANSVAGINAL ULTRASOUND OF PELVIS TECHNIQUE: Both transabdominal and transvaginal ultrasound examinations of the pelvis were performed. Transabdominal technique was performed for global imaging of the pelvis including uterus, ovaries, adnexal regions, and pelvic cul-de-sac. It was necessary to proceed with endovaginal exam following the transabdominal exam to visualize the uterus, endometrium, and ovaries. COMPARISON:  07/23/2020 FINDINGS: Uterus Measurements: 13.6 x 8.0 x 7.0 cm = volume: 400 mL. Anteverted. Heterogeneous myometrium. RIGHT fundal leiomyoma 6.3 x 4.4 x 4.4 cm with scattered shadowing. No additional mass. Vague hypoechogenicity at fundus adjacent to the endometrial complex may be related to prior D&C , approximately 2.4 x 1.7 x 2.8 cm in greatest dimensions.  Endometrium Thickness: Inadequately visualized due to large upper uterine leiomyoma and scattered areas of shadowing. No obvious endometrial fluid Right ovary Not visualized, likely obscured by bowel Left ovary Measurements: 3.6 x 2.1 x 3.0 cm = volume: 12 mL. Normal follow G without mass Other findings No free pelvic fluid or adnexal masses. IMPRESSION: 6.3 cm diameter RIGHT fundal leiomyoma with scattered shadowing. Vague area of hypoechogenicity in fundus adjacent endometrial complex question related to prior D&C. Nonvisualization of endometrial complex and RIGHT ovary. Electronically Signed   By: Lavonia Dana M.D.   On: 07/26/2020 18:31   US Pelvis Complete  Result Date: 07/26/2020 CLINICAL DATA:  Vaginal bleeding, follow-up, recent D&C EXAM: TRANSABDOMINAL AND TRANSVAGINAL ULTRASOUND OF PELVIS TECHNIQUE: Both transabdominal and transvaginal ultrasound examinations of the pelvis were performed. Transabdominal technique was performed for global imaging of the pelvis including uterus, ovaries, adnexal regions, and pelvic cul-de-sac. It was necessary to proceed with endovaginal exam following the transabdominal exam to visualize the uterus, endometrium, and ovaries. COMPARISON:  07/23/2020 FINDINGS: Uterus Measurements: 13.6 x 8.0 x 7.0 cm = volume: 400 mL. Anteverted. Heterogeneous myometrium. RIGHT fundal leiomyoma 6.3 x 4.4 x 4.4 cm with scattered shadowing. No additional mass. Vague hypoechogenicity at fundus adjacent  to the endometrial complex may be related to prior D&C , approximately 2.4 x 1.7 x 2.8 cm in greatest dimensions. Endometrium Thickness: Inadequately visualized due to large upper uterine leiomyoma and scattered areas of shadowing. No obvious endometrial fluid Right ovary Not visualized, likely obscured by bowel Left ovary Measurements: 3.6 x 2.1 x 3.0 cm = volume: 12 mL. Normal follow G without mass Other findings No free pelvic fluid or adnexal masses. IMPRESSION: 6.3 cm diameter RIGHT fundal  leiomyoma with scattered shadowing. Vague area of hypoechogenicity in fundus adjacent endometrial complex question related to prior D&C. Nonvisualization of endometrial complex and RIGHT ovary. Electronically Signed   By: Lavonia Dana M.D.   On: 07/26/2020 18:31    Procedures Procedures   Medications Ordered in ED Medications  potassium chloride 10 mEq in 100 mL IVPB (10 mEq Intravenous New Bag/Given 07/26/20 1940)  0.9 %  sodium chloride infusion ( Intravenous New Bag/Given 07/26/20 1940)  ondansetron (ZOFRAN) injection 4 mg (4 mg Intravenous Given 07/26/20 1823)  sodium chloride 0.9 % bolus 1,000 mL (0 mLs Intravenous Stopped 07/26/20 1929)  metoCLOPramide (REGLAN) injection 10 mg (10 mg Intravenous Given 07/26/20 1823)  diphenhydrAMINE (BENADRYL) injection 50 mg (50 mg Intravenous Given 07/26/20 1822)  morphine 2 MG/ML injection 2 mg (2 mg Intravenous Given 07/26/20 1823)    ED Course  I have reviewed the triage vital signs and the nursing notes.  Pertinent labs & imaging results that were available during my care of the patient were reviewed by me and considered in my medical decision making (see chart for details).    MDM Rules/Calculators/A&P                          Alert 30 year old female no distress, nontoxic-appearing.  Patient presents emerged part with a chief complaint of vaginal bleeding, nausea/vomiting, pelvic pain, and headaches.  Per chart review patient had D&C performed on 628.  Patient was then seen at [facility] on 7/2 for lower abdominal pain and headache.  Patient had CSF work-up including pelvic ultrasound, CT abdomen pelvis, CT head, CT venogram of head.  OB/GYN was consulted.  Patient was discharged with OB/GYN follow-up antibiotics for post aborted endometritis.  Also given follow-up with neurology.  Physical exam abdomen soft, nondistended, right-sided suprapubic tenderness.  No guarding or rebound tenderness noted to abdomen.  On GU exam patient is noted to have vaginal  bleeding in vaginal vault; no cervical motion tenderness.  Patient has no focal neurological deficits.  Will obtain CMP, CBC, quantitative hCG, urinalysis, pelvic ultrasound, lipase, wet prep, and gonorrhea chlamydia testing.  Patient defers empiric gonorrhea chlamydia testing at this time as she is on concern for STI.  Migraine cocktail for her headache.  Reports complete resolution of headache after receiving migraine cocktail.  Patient reports complete resolution of her nausea and suprapubic pain.    CMP shows potassium decreased at 2.7; please secondary to multiple episode of nausea vomiting.  We will give patient IV potassium. CBC shows hemoglobin 10.5 and hematocrit 29.5; values are stable with labs collected 3 days prior.  Platelets noted to be decreased at 121.   Lipase within normal limits. Quantitative hCG 1344; ending down from previous. Prep shows no signs of yeast infection, bacterial vaginosis, or trichomonas. Urinalysis shows bacteria few, WBC 6-10, RBC 0-5.  On serial reexamination patient's abdomen soft, nondistended, nontender.  We will have patient do p.o. challenge and if able to tolerate p.o. intake plan  to discharge with follow-up to OB/GYN and neurology.    Patient care transferred to [place] at the end of my shift. Patient presentation, ED course, and plan of care discussed with review of all pertinent labs and imaging. Please see his/her note for further details regarding further ED course and disposition.    Final Clinical Impression(s) / ED Diagnoses Final diagnoses:  Vaginal bleeding  Acute nonintractable headache, unspecified headache type  Non-intractable vomiting with nausea, unspecified vomiting type    Rx / DC Orders ED Discharge Orders          Ordered    potassium chloride (KLOR-CON) 10 MEQ tablet  Daily        07/26/20 2029    ondansetron (ZOFRAN ODT) 4 MG disintegrating tablet  Every 8 hours PRN        07/26/20 2021              Loni Beckwith, PA-C 07/26/20 [place], [place], DO 07/27/20 1658

## 2020-07-26 NOTE — ED Notes (Signed)
Portable US at bedside.

## 2020-07-26 NOTE — ED Notes (Signed)
Chaperone for EDP Peter for pelvic exam. Pt tolerated well.

## 2020-07-26 NOTE — ED Triage Notes (Signed)
C/o vaginal bleeding and vomiting x 1 day.

## 2020-07-26 NOTE — ED Notes (Signed)
Pt on monitor, 5 lead, resting comfortably

## 2020-07-27 LAB — GC/CHLAMYDIA PROBE AMP (~~LOC~~) NOT AT ARMC
Chlamydia: NEGATIVE
Comment: NEGATIVE
Comment: NORMAL
Neisseria Gonorrhea: NEGATIVE

## 2020-08-15 ENCOUNTER — Ambulatory Visit (INDEPENDENT_AMBULATORY_CARE_PROVIDER_SITE_OTHER): Payer: Medicaid Other | Admitting: Obstetrics

## 2020-08-15 ENCOUNTER — Encounter: Payer: Self-pay | Admitting: Obstetrics

## 2020-08-15 ENCOUNTER — Other Ambulatory Visit: Payer: Self-pay

## 2020-08-15 VITALS — BP 123/83 | HR 72 | Ht 67.0 in | Wt 174.8 lb

## 2020-08-15 DIAGNOSIS — Z3009 Encounter for other general counseling and advice on contraception: Secondary | ICD-10-CM | POA: Diagnosis not present

## 2020-08-15 DIAGNOSIS — Z30011 Encounter for initial prescription of contraceptive pills: Secondary | ICD-10-CM

## 2020-08-15 DIAGNOSIS — D251 Intramural leiomyoma of uterus: Secondary | ICD-10-CM | POA: Diagnosis not present

## 2020-08-15 DIAGNOSIS — Z9889 Other specified postprocedural states: Secondary | ICD-10-CM | POA: Diagnosis not present

## 2020-08-15 MED ORDER — ORTHO-NOVUM 1/35 (28) 1-35 MG-MCG PO TABS: 1.0000 | ORAL_TABLET | Freq: Every day | ORAL | 11 refills | Status: DC

## 2020-08-15 NOTE — Progress Notes (Signed)
Patient presents as a new patient to discuss birth control. Patient states that she had an abortion on 6/28. Patient would like to start back on Mirena IUD. She has concerns about endometritis and ovarian cyst on the right side. 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

## 2020-08-15 NOTE — Progress Notes (Addendum)
Subjective:    Darlene Livingston is a 30 y.o. female who presents for contraceptive counseling after TAB. The patient has no complaints today. The patient is not sexually active. Pertinent past medical history: none.  The information documented in the HPI was reviewed and verified.  Menstrual History: OB History     Gravida  5   Para  2   Term  2   Preterm  0   AB  3   Living  2      SAB  2   IAB  1   Ectopic  0   Multiple  0   Live Births  2            Patient's last menstrual period was 04/21/2020.   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   Headache(784.0)    Miscarriage    x2, one at 13weeks, one at [redacted] weeks gestation    Past Surgical History:  Procedure Laterality Date   THERAPEUTIC ABORTION  07/19/2020   Done at [facility], [redacted] weeks gestation     Current Outpatient Medications:    norethindrone-ethinyl estradiol 1/35 ([place] 1/35, 28,) tablet, Take 1 tablet by mouth daily., Disp: 28 tablet, Rfl: 11 Allergies  Allergen Reactions   Fruit Blend Other (See Comments)    Sore throat from eating acidic fresh fruits    Social History   Tobacco Use   Smoking status: Never   Smokeless tobacco: Never  Substance Use Topics   Alcohol use: No    Comment: social    Family History  Problem Relation Age of Onset   Hypertension Mother    Depression Maternal Aunt    Diabetes Maternal Aunt    Depression Maternal Uncle    Diabetes Maternal Uncle    Asthma Paternal Aunt    Cancer Paternal Aunt    Depression Paternal Aunt    Diabetes Paternal Aunt    Asthma Paternal Uncle    Depression Paternal Uncle    Diabetes Paternal Uncle    Birth defects Cousin    Anesthesia problems Neg Hx    Hypotension Neg Hx    Malignant hyperthermia Neg Hx     Pseudochol deficiency Neg Hx        Review of Systems Constitutional: negative for weight loss Genitourinary:negative for abnormal menstrual periods and vaginal discharge   Objective:   BP 123/83   Pulse 72   Ht '5\' 7"'$  (1.702 m)   Wt 174 lb 12.8 oz (79.3 kg)   LMP 04/21/2020   BMI 27.38 kg/m    General:   Alert and no distress  Skin:   no rash or abnormalities  Lungs:   clear to auscultation bilaterally  Heart:   regular rate and rhythm, S1, S2 normal, no murmur, click, rub or gallop  The remainder of the physical exam deferred due to the type of visit.   Lab Review Urine pregnancy test Labs reviewed yes Radiologic studies reviewed yes  I have spent a total of 15 minutes of face-to-face time, excluding clinical staff time, reviewing notes and preparing to see patient, ordering tests and/or medications, and counseling the patient.   Assessment:    30 y.o., starting OCP (estrogen/progesterone), no contraindications.   Plan:   1. Postoperative state -  s/p TAB ~ 4 weeks ago.  Doing well.  2. Encounter for counseling regarding contraception - wants OCP's  3. Encounter for initial prescription of contraceptive pills Rx: - norethindrone-ethinyl estradiol 1/35 ([place] 1/35, 28,) tablet; Take 1 tablet by mouth daily.  Dispense: 28 tablet; Refill: 11  4. Fibroids, intramural - clinically stable    All questions answered. Contraception: OCP (estrogen/progesterone). Discussed healthy lifestyle modifications. Neurosurgeon distributed. Follow up in 3 months.   Meds ordered this encounter  Medications   norethindrone-ethinyl estradiol 1/35 ([place] 1/35, 28,) tablet    Sig: Take 1 tablet by mouth daily.    Dispense:  28 tablet    Refill:  11    Shelly Bombard, MD 08/15/2020 3:58 PM

## 2021-08-01 ENCOUNTER — Other Ambulatory Visit: Payer: Self-pay

## 2021-08-01 ENCOUNTER — Inpatient Hospital Stay (HOSPITAL_COMMUNITY)
Admission: AD | Admit: 2021-08-01 | Discharge: 2021-08-01 | Discharge status: Against Medical Advice | Payer: Medicaid Other | Attending: Obstetrics & Gynecology | Admitting: Obstetrics & Gynecology

## 2021-08-01 NOTE — MAU Note (Signed)
MAU registration came to unit and turned in signed AMA form from patient.

## 2021-09-15 ENCOUNTER — Encounter (HOSPITAL_COMMUNITY): Payer: Self-pay | Admitting: Emergency Medicine

## 2021-09-15 ENCOUNTER — Ambulatory Visit (HOSPITAL_COMMUNITY)
Admission: EM | Admit: 2021-09-15 | Discharge: 2021-09-15 | Disposition: A | Discharge status: Home / Self Care | Payer: Medicaid Other

## 2021-09-15 DIAGNOSIS — N9089 Other specified noninflammatory disorders of vulva and perineum: Secondary | ICD-10-CM

## 2021-09-15 NOTE — ED Triage Notes (Signed)
Pt presents to [facility] with co of lump/ bump to vaginal area. Pt reports painful when she urinates but otherwise it is not bothing her, pt reports no currently sexually active, no discharge.

## 2021-09-15 NOTE — Discharge Instructions (Signed)
Bump does not appear to be infectious such as herpes or a wart. Suspect it is a small cyst and may resolve on it's own. Follow-up with gynecologist for further evaluation especially if becomes larger or changes.

## 2021-09-15 NOTE — ED Provider Notes (Signed)
[place]    CSN: [id number] Arrival date & time: 09/15/21  1756      History   Chief Complaint No chief complaint on file.   HPI Darlene Livingston is a 31 y.o. female.   Patient presents with concerns of a small bump on her vulva that she noticed a couple days ago. She reports that it is not tender or painful, but after she noticed it she is more aware of it. She reports some mild discomfort with urination but denies frequency or urgency and states it doesn't feel like a UTI. She denies abdominal or back pain, vaginal discharge, or any discharge or bleeding from the bump. The patient has not tried anything for it. She denies concern for STI.   The history is provided by the patient.    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   Headache(784.0)    Miscarriage    x2, one at 13weeks, one at [redacted] weeks gestation    Patient Active Problem List   Diagnosis Date Noted   Status post therapeutic abortion on 07/19/20 07/23/2020    Past Surgical History:  Procedure Laterality Date   THERAPEUTIC ABORTION  07/19/2020   Done at [facility], [redacted] weeks gestation    OB History     Gravida  5   Para  2   Term  2   Preterm  0   AB  3   Living  2      SAB  2   IAB  1   Ectopic  0   Multiple  0   Live Births  2            Home Medications    Prior to Admission medications   Medication Sig Start Date End Date Taking? Authorizing Provider  norethindrone-ethinyl estradiol 1/35 ([place] 1/35, 28,) tablet Take 1 tablet by mouth daily. 08/15/20   Shelly Bombard, MD    Family History Family History  Problem Relation Age of Onset   Hypertension Mother    Depression Maternal Aunt    Diabetes Maternal Aunt    Depression Maternal Uncle    Diabetes Maternal Uncle    Asthma Paternal Aunt    Cancer Paternal Aunt    Depression Paternal Aunt    Diabetes Paternal  Aunt    Asthma Paternal Uncle    Depression Paternal Uncle    Diabetes Paternal Uncle    Birth defects Cousin    Anesthesia problems Neg Hx    Hypotension Neg Hx    Malignant hyperthermia Neg Hx    Pseudochol deficiency Neg Hx     Social History Social History   Tobacco Use   Smoking status: Never   Smokeless tobacco: Never  Vaping Use   Vaping Use: Never used  Substance Use Topics   Alcohol use: No    Comment: social   Drug use: Not Currently    Comment: Delta 8 CBD     Allergies   Fruit blend   Review of Systems Review of Systems  Constitutional:  Negative for fatigue and fever.  Gastrointestinal:  Negative for abdominal pain, nausea and vomiting.  Genitourinary:  Positive for dysuria and genital sores. Negative for difficulty urinating, frequency, hematuria, pelvic pain, vaginal bleeding, vaginal discharge and vaginal pain.  Musculoskeletal:  Negative for back pain.     Physical Exam Triage Vital Signs ED  Triage Vitals  Enc Vitals Group     BP 09/15/21 1859 127/68     Pulse Rate 09/15/21 1858 73     Resp 09/15/21 1858 18     Temp 09/15/21 1858 98.7 F (37.1 C)     Temp src --      SpO2 09/15/21 1858 100 %     Weight --      Height --      Head Circumference --      Peak Flow --      Pain Score --      Pain Loc --      Pain Edu? --      Excl. in [place]? --    No data found.  Updated Vital Signs BP 127/68   Pulse 73   Temp 98.7 F (37.1 C)   Resp 18   LMP 09/05/2021 (Exact Date)   SpO2 100%   Visual Acuity Right Eye Distance:   Left Eye Distance:   Bilateral Distance:    Right Eye Near:   Left Eye Near:    Bilateral Near:     Physical Exam Vitals and nursing note reviewed.  Constitutional:      General: She is not in acute distress. HENT:     Head: Normocephalic.  Eyes:     Pupils: Pupils are equal, round, and reactive to light.  Cardiovascular:     Rate and Rhythm: Normal rate and regular rhythm.     Heart sounds: Normal heart  sounds.  Pulmonary:     Effort: Pulmonary effort is normal.     Breath sounds: Normal breath sounds.  Abdominal:     Palpations: Abdomen is soft.     Tenderness: There is no abdominal tenderness.  Genitourinary:    Comments: Approx 2-77m diameter skin-colored papule to left upper labia minora. No tenderness, notable erythema, or purulence. No ulceration.  Neurological:     Mental Status: She is alert.  Psychiatric:        Mood and Affect: Mood normal.      UC Treatments / Results  Labs (all labs ordered are listed, but only abnormal results are displayed) Labs Reviewed - No data to display  EKG   Radiology No results found.  Procedures Procedures (including critical care time)  Medications Ordered in UC Medications - No data to display  Initial Impression / Assessment and Plan / UC Course  I have reviewed the triage vital signs and the nursing notes.  Pertinent labs & imaging results that were available during my care of the patient were reviewed by me and considered in my medical decision making (see chart for details).     Lesion not consistent with herpes outbreak given appearance and no tenderness/pain. Suspect small cyst. Patient declined U/A or STI testing. Recommended follow-up with gyn for further evaluation especially if lesion changes.   E/M: 1 acute uncomplicated illness, no data, low risk    Final Clinical Impressions(s) / UC Diagnoses   Final diagnoses:  Vulvar lesion     Discharge Instructions      Bump does not appear to be infectious such as herpes or a wart. Suspect it is a small cyst and may resolve on it's own. Follow-up with gynecologist for further evaluation especially if becomes larger or changes.     ED Prescriptions   None    PDMP not reviewed this encounter.   ADelsa Sale PUtah08/25/23 1949

## 2021-10-09 ENCOUNTER — Encounter (HOSPITAL_COMMUNITY): Payer: Self-pay

## 2021-10-09 ENCOUNTER — Ambulatory Visit (HOSPITAL_COMMUNITY)
Admission: EM | Admit: 2021-10-09 | Discharge: 2021-10-09 | Disposition: A | Discharge status: Home / Self Care | Payer: 59 | Attending: Internal Medicine | Admitting: Internal Medicine

## 2021-10-09 DIAGNOSIS — Z20822 Contact with and (suspected) exposure to covid-19: Secondary | ICD-10-CM | POA: Diagnosis not present

## 2021-10-09 DIAGNOSIS — J029 Acute pharyngitis, unspecified: Secondary | ICD-10-CM | POA: Insufficient documentation

## 2021-10-09 DIAGNOSIS — B349 Viral infection, unspecified: Secondary | ICD-10-CM | POA: Insufficient documentation

## 2021-10-09 LAB — RESP PANEL BY RT-PCR (FLU A&B, COVID) ARPGX2
Influenza A by PCR: NEGATIVE
Influenza B by PCR: NEGATIVE
SARS Coronavirus 2 by RT PCR: NEGATIVE

## 2021-10-09 LAB — POCT RAPID STREP A, ED / UC: Streptococcus, Group A Screen (Direct): NEGATIVE

## 2021-10-09 MED ORDER — IBUPROFEN 800 MG PO TABS
ORAL_TABLET | ORAL | Status: AC
  Filled 2021-10-09: qty 1

## 2021-10-09 MED ORDER — IBUPROFEN 100 MG/5ML PO SUSP
800.0000 mg | Freq: Once | ORAL | Status: AC
  Administered 2021-10-09: 800 mg via ORAL

## 2021-10-09 MED ORDER — LIDOCAINE VISCOUS HCL 2 % MT SOLN: 15.0000 mL | OROMUCOSAL | 0 refills | Status: DC | PRN

## 2021-10-09 MED ORDER — LIDOCAINE VISCOUS HCL 2 % MT SOLN
OROMUCOSAL | Status: AC
  Filled 2021-10-09: qty 15

## 2021-10-09 MED ORDER — LIDOCAINE VISCOUS HCL 2 % MT SOLN
15.0000 mL | Freq: Once | OROMUCOSAL | Status: AC
  Administered 2021-10-09: 15 mL via OROMUCOSAL

## 2021-10-09 NOTE — ED Triage Notes (Signed)
Pt is here for sore throat , painful to swallow and headache x2days

## 2021-10-09 NOTE — ED Provider Notes (Signed)
[place]    CSN: [id number] Arrival date & time: 10/09/21  1748     History   Chief Complaint Chief Complaint  Patient presents with   Sore Throat    HPI Darlene Livingston is a 31 y.o. female.  Presents with sore throat, pain with swallowing that began last night. Worsened today. Reports she has developed a headache, body aches, nasal drainage. Has been trying BC powder  No GI or urinary symptoms.  Denies fever or chills  A few coworkers were sick recently  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   Headache(784.0)    Miscarriage    x2, one at 13weeks, one at [redacted] weeks gestation    Patient Active Problem List   Diagnosis Date Noted   Status post therapeutic abortion on 07/19/20 07/23/2020    Past Surgical History:  Procedure Laterality Date   THERAPEUTIC ABORTION  07/19/2020   Done at [facility], [redacted] weeks gestation    OB History     Gravida  5   Para  2   Term  2   Preterm  0   AB  3   Living  2      SAB  2   IAB  1   Ectopic  0   Multiple  0   Live Births  2            Home Medications    Prior to Admission medications   Medication Sig Start Date End Date Taking? Authorizing Provider  lidocaine (XYLOCAINE) 2 % solution Use as directed 15 mLs in the mouth or throat as needed (throat pain). 10/09/21  Yes [employer], Wells Guiles, PA-C  norethindrone-ethinyl estradiol 1/35 ([place] 1/35, 28,) tablet Take 1 tablet by mouth daily. 08/15/20   Shelly Bombard, MD    Family History Family History  Problem Relation Age of Onset   Hypertension Mother    Depression Maternal Aunt    Diabetes Maternal Aunt    Depression Maternal Uncle    Diabetes Maternal Uncle    Asthma Paternal Aunt    Cancer Paternal Aunt    Depression Paternal Aunt    Diabetes Paternal Aunt    Asthma Paternal Uncle    Depression Paternal Uncle    Diabetes Paternal  Uncle    Birth defects Cousin    Anesthesia problems Neg Hx    Hypotension Neg Hx    Malignant hyperthermia Neg Hx    Pseudochol deficiency Neg Hx     Social History Social History   Tobacco Use   Smoking status: Never   Smokeless tobacco: Never  Vaping Use   Vaping Use: Never used  Substance Use Topics   Alcohol use: No    Comment: social   Drug use: Not Currently    Comment: Delta 8 CBD     Allergies   Fruit blend   Review of Systems Review of Systems  Per HPI  Physical Exam Triage Vital Signs ED Triage Vitals  Enc Vitals Group     BP 10/09/21 1919 118/79     Pulse Rate 10/09/21 1919 78     Resp 10/09/21 1919 12     Temp 10/09/21 1919 98.9 F (37.2 C)     Temp Source 10/09/21 1919 Oral     SpO2 10/09/21 1919 99 %     Weight 10/09/21 1915 188 lb (85.3 kg)  Height 10/09/21 1915 '5\' 6"'$  (1.676 m)     Head Circumference --      Peak Flow --      Pain Score 10/09/21 1915 6     Pain Loc --      Pain Edu? --      Excl. in [place]? --    No data found.  Updated Vital Signs BP 118/79 (BP Location: Left Arm)   Pulse 78   Temp 98.9 F (37.2 C) (Oral)   Resp 12   Ht '5\' 6"'$  (1.676 m)   Wt 188 lb (85.3 kg)   LMP 10/05/2021   SpO2 99%   BMI 30.34 kg/m   Physical Exam Vitals and nursing note reviewed.  Constitutional:      General: She is not in acute distress.    Appearance: She is not ill-appearing.  HENT:     Nose: Congestion present.     Mouth/Throat:     Mouth: Mucous membranes are moist.     Pharynx: Oropharynx is clear. Uvula midline. Posterior oropharyngeal erythema present.     Tonsils: Tonsillar exudate present. No tonsillar abscesses. 3+ on the right. 3+ on the left.  Eyes:     Conjunctiva/sclera: Conjunctivae normal.     Pupils: Pupils are equal, round, and reactive to light.  Cardiovascular:     Rate and Rhythm: Normal rate and regular rhythm.     Heart sounds: Normal heart sounds.  Pulmonary:     Effort: Pulmonary effort is normal.      Breath sounds: Normal breath sounds.  Abdominal:     Tenderness: There is no abdominal tenderness.  Musculoskeletal:     Cervical back: Normal range of motion.  Lymphadenopathy:     Cervical: Cervical adenopathy present.  Neurological:     Mental Status: She is alert and oriented to person, place, and time.      UC Treatments / Results  Labs (all labs ordered are listed, but only abnormal results are displayed) Labs Reviewed  RESP PANEL BY RT-PCR (FLU A&B, COVID) [id number]  POCT RAPID STREP A, ED / UC    EKG  Radiology No results found.  Procedures Procedures   Medications Ordered in UC Medications  ibuprofen (ADVIL) 100 MG/5ML suspension 800 mg (800 mg Oral Given 10/09/21 1950)  lidocaine (XYLOCAINE) 2 % viscous mouth solution 15 mL (15 mLs Mouth/Throat Given 10/09/21 1951)    Initial Impression / Assessment and Plan / UC Course  I have reviewed the triage vital signs and the nursing notes.  Pertinent labs & imaging results that were available during my care of the patient were reviewed by me and considered in my medical decision making (see chart for details).  Strep test negative. COVID/flu pending Likely viral etiology of symptoms  Ibuprofen and viscous lidocaine gargle given in clinic. Patient reports lidocaine helped a lot with pain. Sent prescription to pharmacy for use PRN. Discussed other symptomatic care at home Work note provided Return precautions discussed. Patient agrees to plan  Final Clinical Impressions(s) / UC Diagnoses   Final diagnoses:  Viral pharyngitis  Viral illness     Discharge Instructions      We will call you if your covid/flu test returns positive.   In the meantime continue symptomatic care. You can take ibuprofen every 6 hours as needed for sore throat, body aches, headache. You can use the viscous lidocaine to gargle and spit to help with sore throat.  Increase your fluid intake as much as tolerated.  If you develop other  symptoms such as nasal congestion and cough, try over-the-counter medicine such as Mucinex.    ED Prescriptions     Medication Sig Dispense Auth. Provider   lidocaine (XYLOCAINE) 2 % solution Use as directed 15 mLs in the mouth or throat as needed (throat pain). 100 mL Gwynevere Lizana, Wells Guiles, PA-C      PDMP not reviewed this encounter.   Billye Nydam, Vernice Jefferson 10/09/21 2010

## 2021-10-09 NOTE — Discharge Instructions (Addendum)
We will call you if your covid/flu test returns positive.   In the meantime continue symptomatic care. You can take ibuprofen every 6 hours as needed for sore throat, body aches, headache. You can use the viscous lidocaine to gargle and spit to help with sore throat.  Increase your fluid intake as much as tolerated.  If you develop other symptoms such as nasal congestion and cough, try over-the-counter medicine such as Mucinex.

## 2022-01-07 IMAGING — US US OB COMP LESS 14 WK
1 series · 15 of 28 positions shown · non-contrast
Comparison: None.

CLINICAL DATA: Pregnant patient with vaginal bleeding.

EXAM:
OBSTETRIC <14 WK ULTRASOUND
TECHNIQUE: Transabdominal ultrasound was performed for evaluation of the
gestation as well as the maternal uterus and adnexal regions.

[Series 1: us ob comp less 14 wk · 15 of 46 slices shown]
[im 1/46]
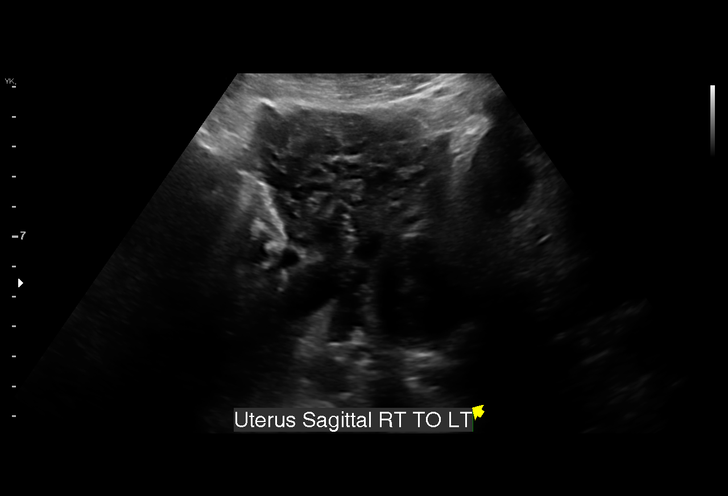
[im 4/46]
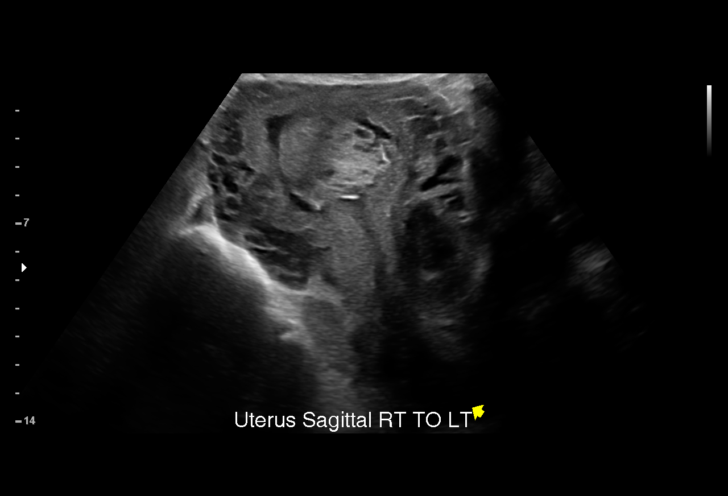
[im 7/46]
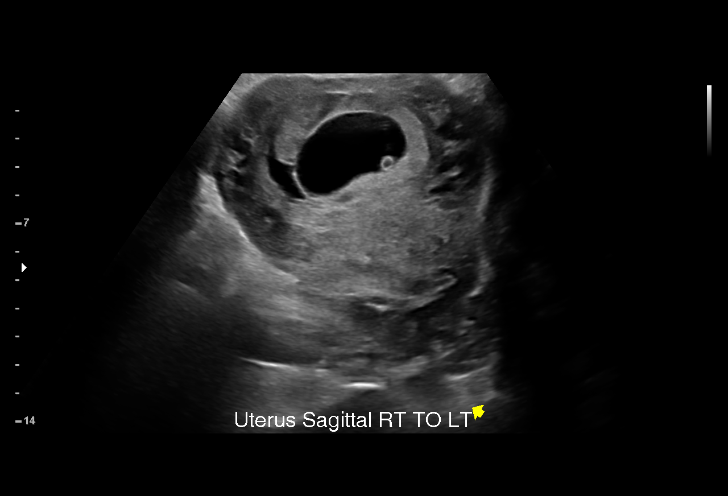
[im 11/46]
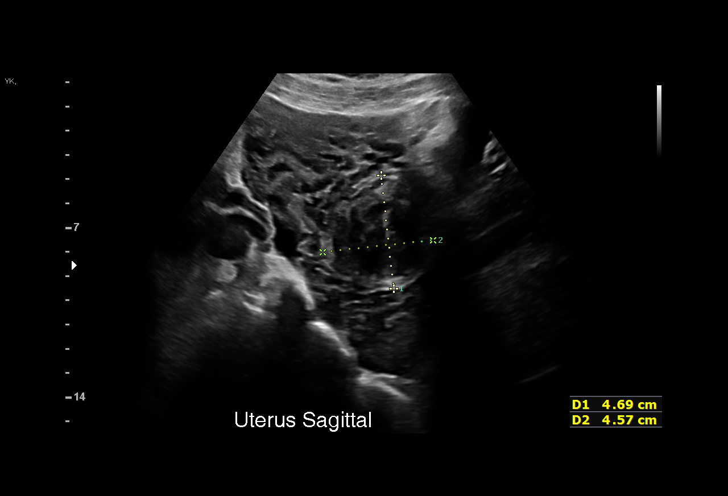
[im 14/46]
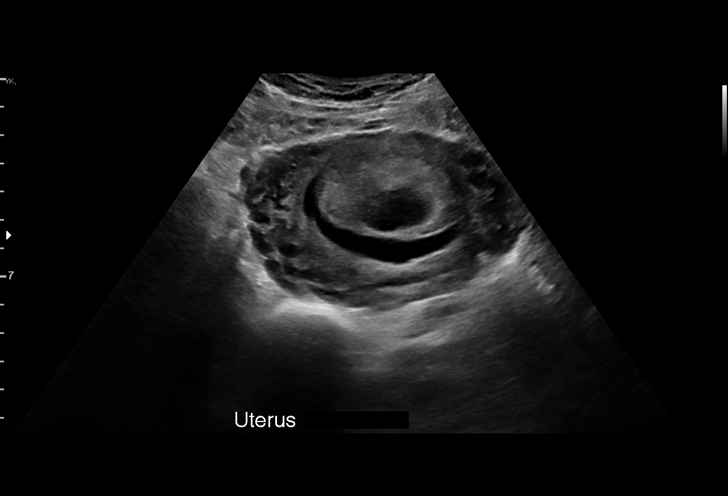
[im 17/46]
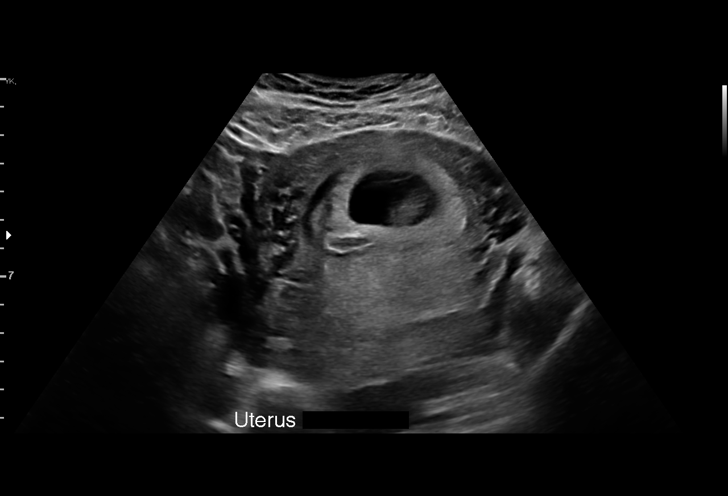
[im 21/46]
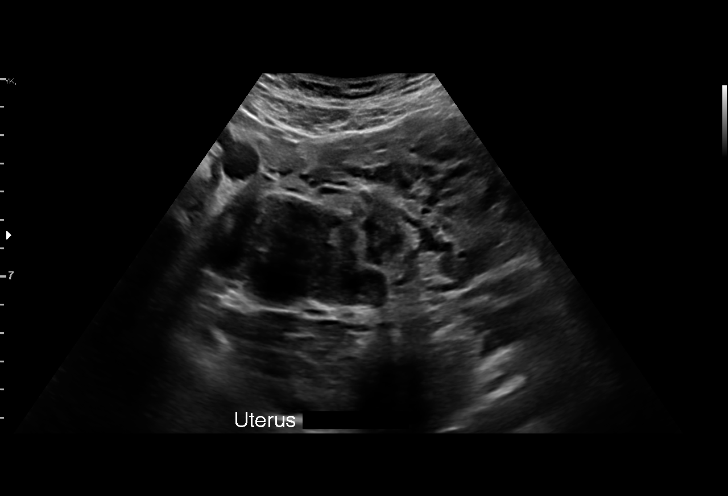
[im 24/46]
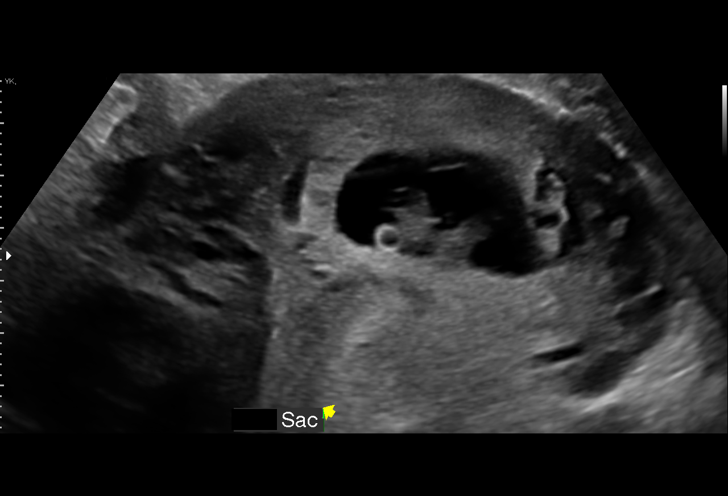
[im 26/46]
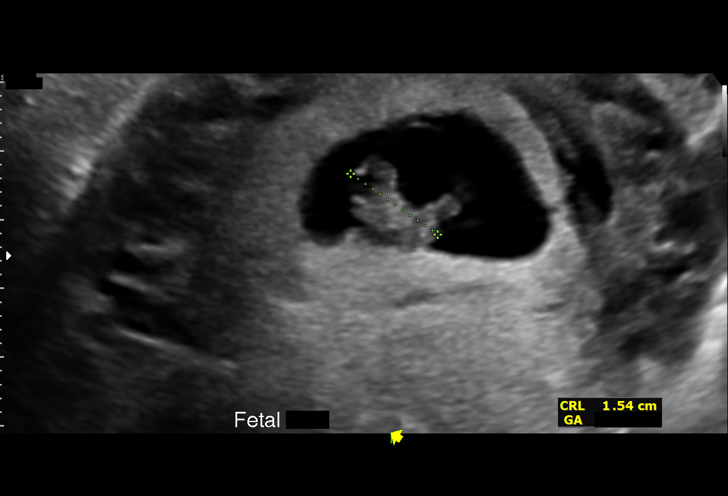
[im 29/46]
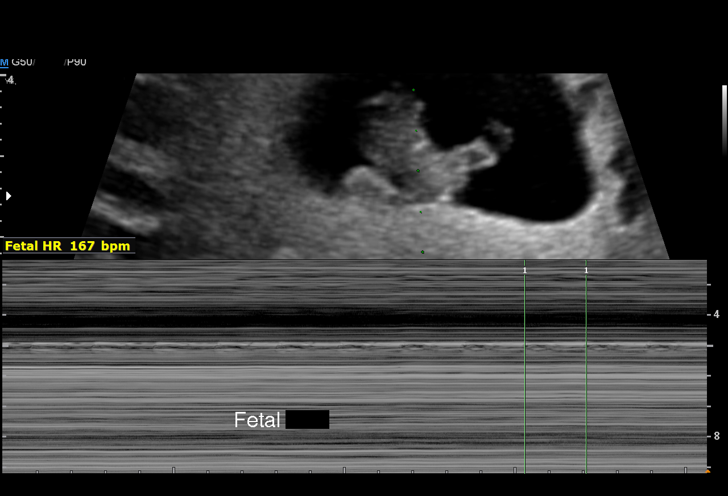
[im 32/46]
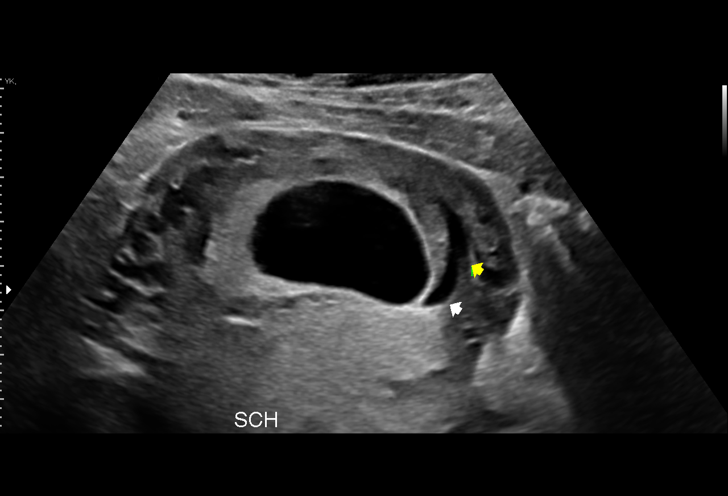
[im 36/46]
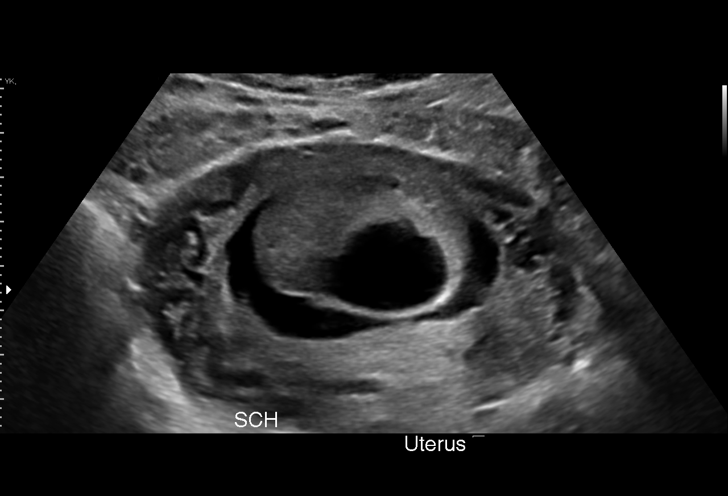
[im 39/46]
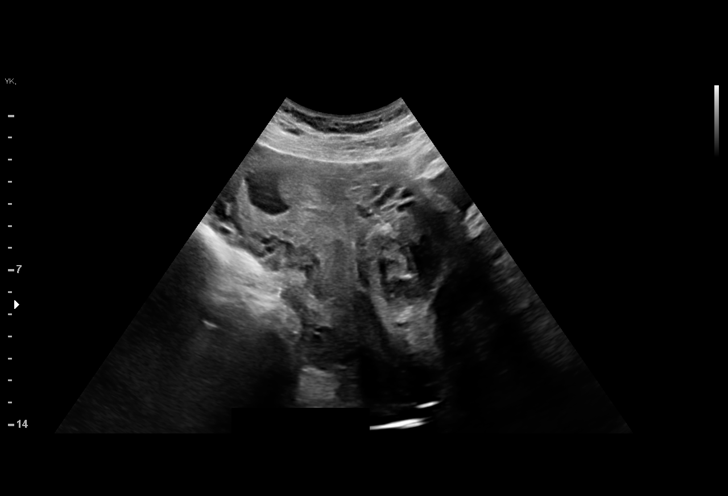
[im 42/46]
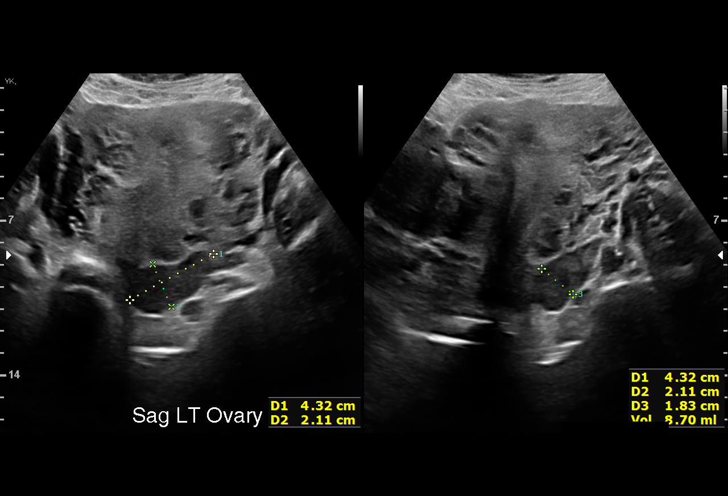
[im 46/46]
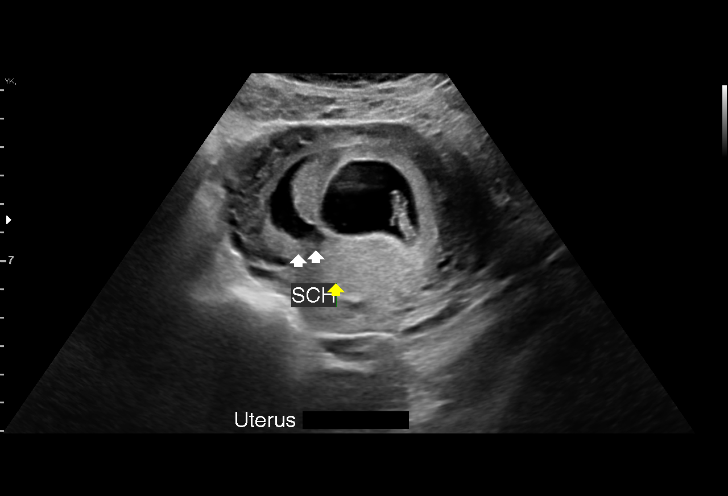

[15 of 28 positions shown; findings below may reference images not displayed]

FINDINGS: Intrauterine gestational sac: Single

Yolk sac:  Visualized.

Embryo:  Visualized.

Cardiac Activity: Visualized.

Heart Rate: 171 bpm

MSD:    mm    w     d

CRL:   16.1 mm   8 w 0 d                  US EDC: February 25, 2021

Subchorionic hemorrhage: There is a moderate size subchorionic
hemorrhage.

Maternal uterus/adnexae: The ovaries are normal. A fibroid is
located in the anterior right lateral corpus of the uterus measuring
4.7 x 4.6 x 5.5 cm
IMPRESSION: 1. Single live IUP.
2. Moderate size subchorionic hemorrhage.
3. 5.5 cm fibroid.

## 2022-01-17 IMAGING — US US PELVIS COMPLETE
1 series · 13 of 25 positions shown · non-contrast
Comparison: 07/23/2020

CLINICAL DATA: Vaginal bleeding, follow-up, recent D&C



[Series 1: us pelvis complete · 13 of 90 slices shown]
[im 1/90]
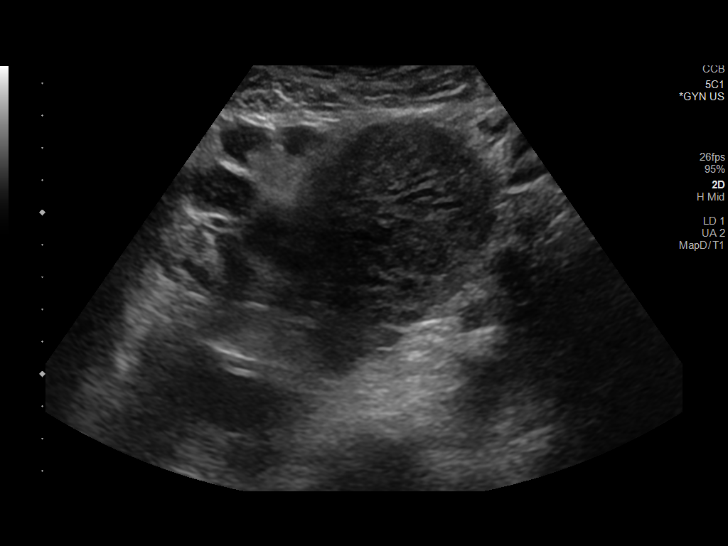
[im 8/90]
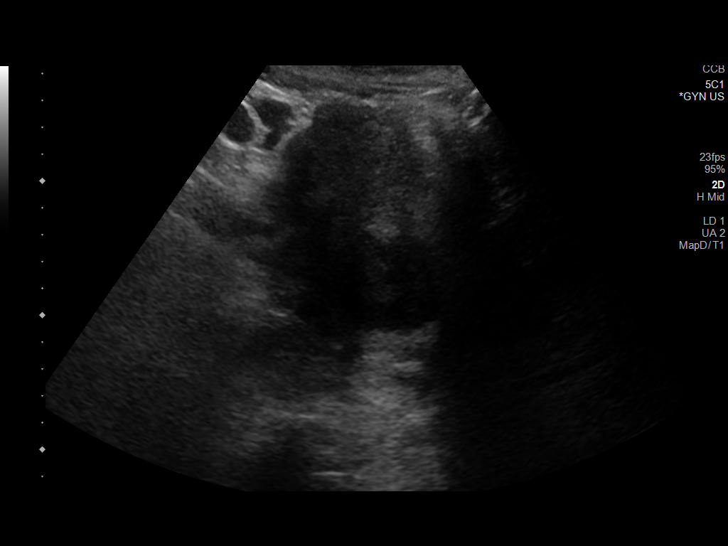
[im 15/90]
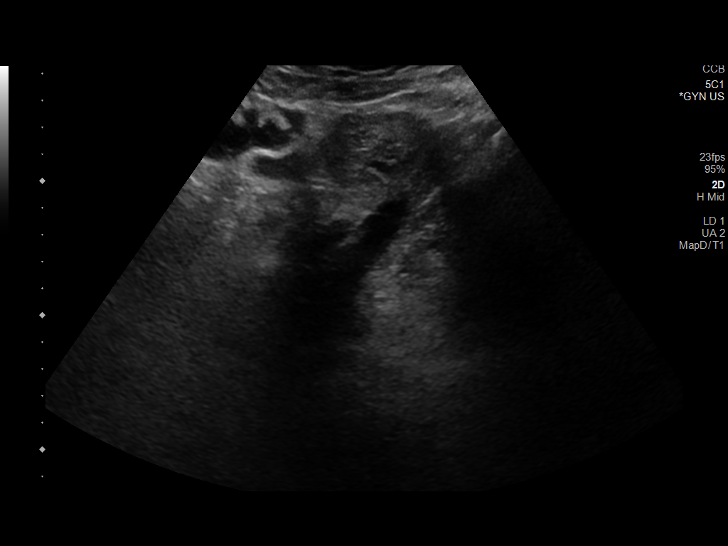
[im 23/90]
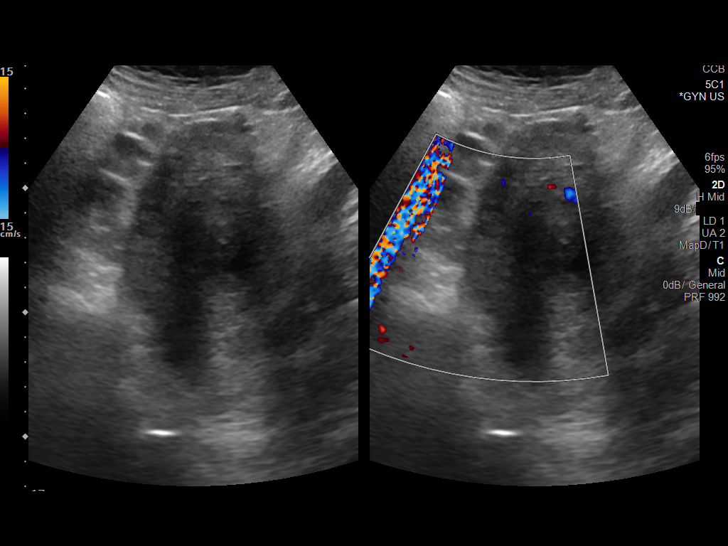
[im 30/90]
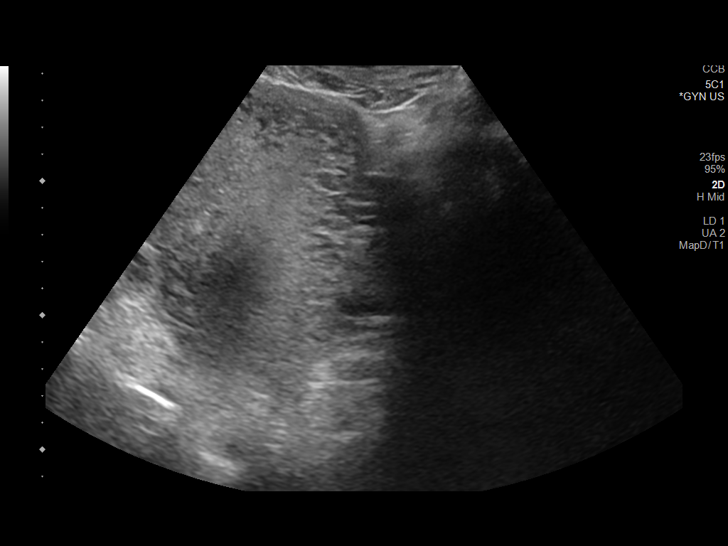
[im 38/90]
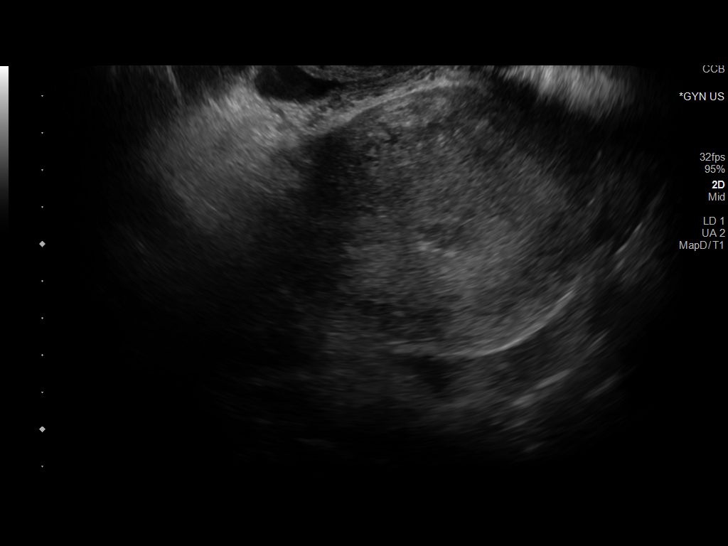
[im 45/90]
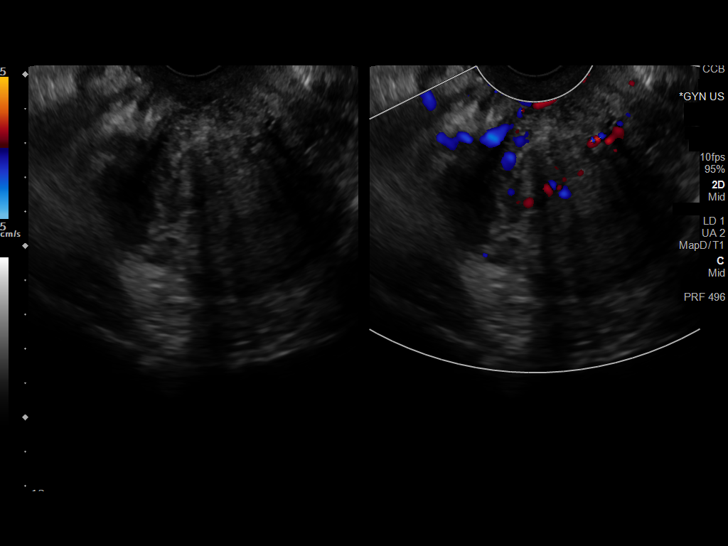
[im 52/90]
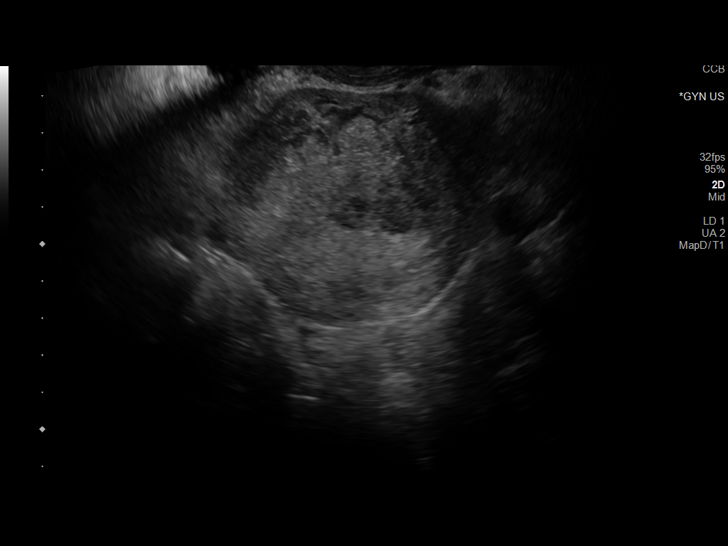
[im 60/90]
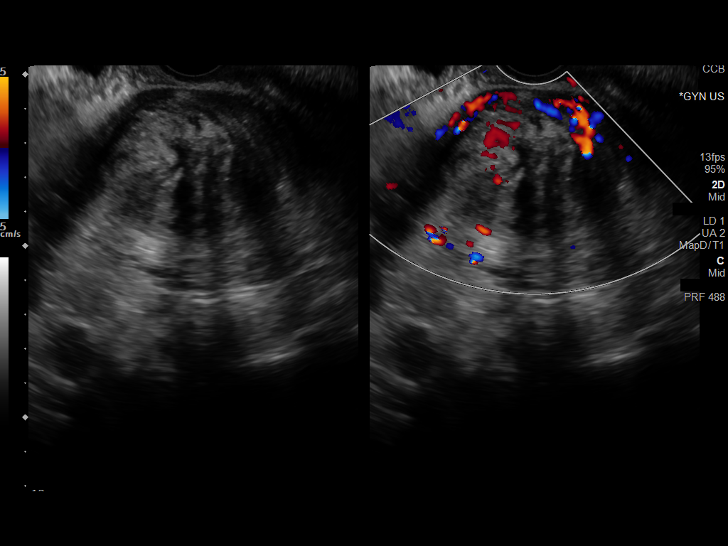
[im 67/90]
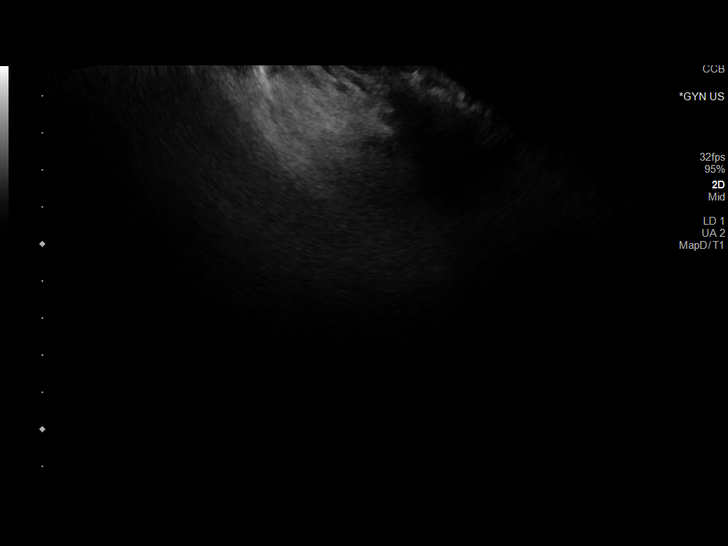
[im 75/90]
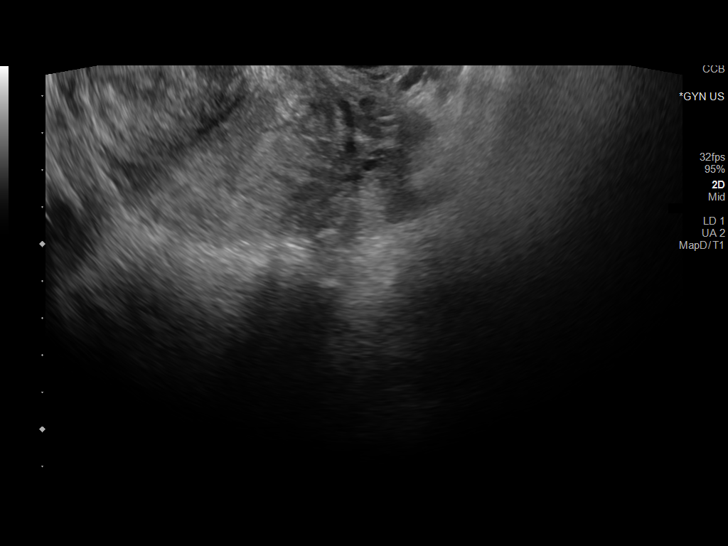
[im 82/90]
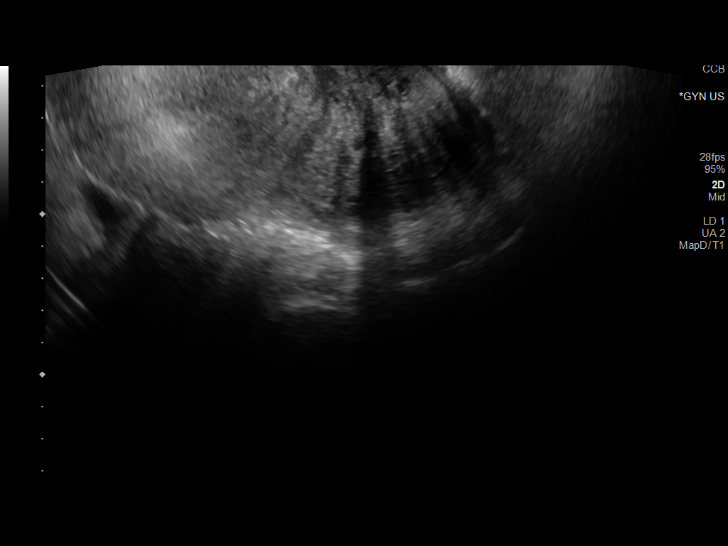
[im 90/90]
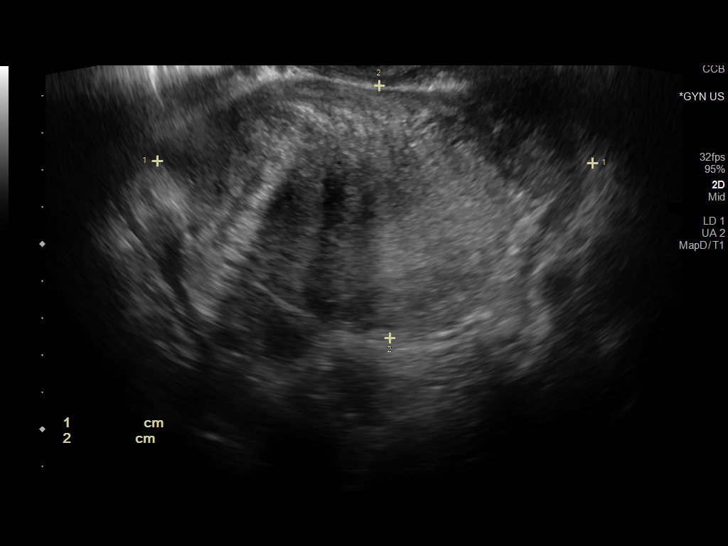

[13 of 25 positions shown; findings below may reference images not displayed]

FINDINGS: Uterus

Measurements: 13.6 x 8.0 x 7.0 cm = volume: 400 mL. Anteverted.
Heterogeneous myometrium. RIGHT fundal leiomyoma 6.3 x 4.4 x 4.4 cm
with scattered shadowing. No additional mass. Vague hypoechogenicity
at fundus adjacent to the endometrial complex may be related to
prior D&C , approximately 2.4 x 1.7 x 2.8 cm in greatest
dimensions.

Endometrium

Thickness: Inadequately visualized due to large upper uterine
leiomyoma and scattered areas of shadowing. No obvious endometrial
fluid

Right ovary

Not visualized, likely obscured by bowel

Left ovary

Measurements: 3.6 x 2.1 x 3.0 cm = volume: 12 mL. Normal follow G
without mass

Other findings

No free pelvic fluid or adnexal masses.
IMPRESSION: 6.3 cm diameter RIGHT fundal leiomyoma with scattered shadowing.

Vague area of hypoechogenicity in fundus adjacent endometrial
complex question related to prior D&C.

Nonvisualization of endometrial complex and RIGHT ovary.

## 2022-01-22 NOTE — L&D Delivery Note (Signed)
[facility]N Faculty [facility] Delivery Note  Darlene Livingston is a 32 y.o. [id number] s/p SVD at [redacted]w[redacted]d. She was admitted for SOL.   ROM: 4h 40m with clear fluid GBS Status: positive   Maximum Maternal Temperature: afebrile  Labor Progress: Initial SVE: 4/100. She then progressed to complete.   Delivery Date/Time:  Delivery: Called to room and patient was complete and pushing. Head delivered OA. No nuchal cord present. Shoulder and body delivered in usual fashion. Infant with spontaneous cry, placed on mother's abdomen, dried and stimulated. Cord clamped x 2 after 1-minute delay, and cut by FOB. Cord blood drawn. Placenta delivered spontaneously with gentle cord traction. Fundus firm with massage and Pitocin. Labia, perineum, vagina, and cervix inspected inspected with no repair.  Baby Weight: pending  Placenta: Sent to L&D Complications: None Lacerations: none EBL: 125 mL Analgesia: Epidural   Infant:  APGAR (1 MIN): 9  APGAR (5 MINS): 9  APGAR (10 MINS):     Levie Heritage, DO [facility] 10/29/2022, 3:54 PM

## 2022-03-21 ENCOUNTER — Inpatient Hospital Stay (HOSPITAL_COMMUNITY): Payer: 59

## 2022-03-21 ENCOUNTER — Inpatient Hospital Stay (HOSPITAL_COMMUNITY)
Admission: AD | Admit: 2022-03-21 | Discharge: 2022-03-21 | Disposition: A | Discharge status: Home / Self Care | Payer: 59 | Attending: Obstetrics and Gynecology | Admitting: Obstetrics and Gynecology

## 2022-03-21 ENCOUNTER — Encounter (HOSPITAL_COMMUNITY): Payer: Self-pay

## 2022-03-21 DIAGNOSIS — O26891 Other specified pregnancy related conditions, first trimester: Secondary | ICD-10-CM | POA: Insufficient documentation

## 2022-03-21 DIAGNOSIS — K5900 Constipation, unspecified: Secondary | ICD-10-CM | POA: Diagnosis not present

## 2022-03-21 DIAGNOSIS — M549 Dorsalgia, unspecified: Secondary | ICD-10-CM | POA: Diagnosis not present

## 2022-03-21 DIAGNOSIS — Z3A01 Less than 8 weeks gestation of pregnancy: Secondary | ICD-10-CM | POA: Insufficient documentation

## 2022-03-21 DIAGNOSIS — R102 Pelvic and perineal pain: Secondary | ICD-10-CM

## 2022-03-21 DIAGNOSIS — O209 Hemorrhage in early pregnancy, unspecified: Secondary | ICD-10-CM | POA: Insufficient documentation

## 2022-03-21 DIAGNOSIS — R109 Unspecified abdominal pain: Secondary | ICD-10-CM | POA: Insufficient documentation

## 2022-03-21 DIAGNOSIS — O3411 Maternal care for benign tumor of corpus uteri, first trimester: Secondary | ICD-10-CM | POA: Diagnosis not present

## 2022-03-21 DIAGNOSIS — O99611 Diseases of the digestive system complicating pregnancy, first trimester: Secondary | ICD-10-CM | POA: Diagnosis not present

## 2022-03-21 LAB — URINALYSIS, ROUTINE W REFLEX MICROSCOPIC
Bilirubin Urine: NEGATIVE
Glucose, UA: NEGATIVE mg/dL
Hgb urine dipstick: NEGATIVE
Ketones, ur: NEGATIVE mg/dL
Leukocytes,Ua: NEGATIVE
Nitrite: NEGATIVE
Protein, ur: NEGATIVE mg/dL
Specific Gravity, Urine: 1.02 (ref 1.005–1.030)
pH: 7.5 (ref 5.0–8.0)

## 2022-03-21 LAB — WET PREP, GENITAL
Sperm: NONE SEEN
Trich, Wet Prep: NONE SEEN
WBC, Wet Prep HPF POC: <10 (ref <10)
Yeast Wet Prep HPF POC: NONE SEEN

## 2022-03-21 LAB — POCT PREGNANCY, URINE: Preg Test, Ur: POSITIVE — AB

## 2022-03-21 LAB — ABO/RH: ABO/RH(D): A POS

## 2022-03-21 LAB — HCG, QUANTITATIVE, PREGNANCY: hCG, Beta Chain, Quant, S: 17307 m[IU]/mL — ABNORMAL HIGH (ref <5)

## 2022-03-21 MED ORDER — PRENATAL 28-0.8 MG PO TABS: 1.0000 | ORAL_TABLET | Freq: Every day | ORAL | 12 refills | Status: DC

## 2022-03-21 MED ORDER — POLYETHYLENE GLYCOL 3350 17 GM/SCOOP PO POWD: 17.0000 g | Freq: Every day | ORAL | 1 refills | Status: DC | PRN

## 2022-03-21 NOTE — MAU Provider Note (Signed)
History     [id number]  Arrival date and time: 03/21/22 [id number]    Chief Complaint  Patient presents with   Abdominal Pain   Back Pain     HPI Darlene Livingston is a 32 y.o. at 15w2dby sure LMP with PMHx notable for fibroid uterus, who presents for pelvic pain.   Patient reports that since yesterday afternoon she has had a crampy, progressively worsening pain in her L lower abdomen Has also had some light spotting but no heavy vaginal bleeding No history of kidney stones, no burning or pain with urination, no blood in urine No vaginal discharge Does endorse some constipation Has never had an ectopic pregnancy   --/--/PENDING (02/28 0932)  OB History     Gravida  6   Para  2   Term  2   Preterm  0   AB  3   Living  2      SAB  2   IAB  1   Ectopic  0   Multiple  0   Live Births  2           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   Headache(784.0)    Miscarriage    x2, one at 13weeks, one at [redacted] weeks gestation    Past Surgical History:  Procedure Laterality Date   THERAPEUTIC ABORTION  07/19/2020   [facility]    Family History  Problem Relation Age of Onset   Hypertension Mother    Depression Maternal Aunt    Diabetes Maternal Aunt    Depression Maternal Uncle    Diabetes Maternal Uncle    Asthma Paternal Aunt    Cancer Paternal Aunt    Depression Paternal Aunt    Diabetes Paternal Aunt    Asthma Paternal Uncle    Depression Paternal Uncle    Diabetes Paternal Uncle    Birth defects Cousin    Anesthesia problems Neg Hx    Hypotension Neg Hx    Malignant hyperthermia Neg Hx    Pseudochol deficiency Neg Hx     Social History   Socioeconomic History   Marital status: Single    Spouse name: Not on file   Number of children: Not on file   Years of education: Not on file   Highest education level: Not on file   Occupational History   Not on file  Tobacco Use   Smoking status: Never   Smokeless tobacco: Never  Vaping Use   Vaping Use: Never used  Substance and Sexual Activity   Alcohol use: No    Comment: social   Drug use: Not Currently    Comment: Delta 8 CBD   Sexual activity: Yes    Partners: Male    Birth control/protection: None  Other Topics Concern   Not on file  Social History Narrative   Not on file   Social Determinants of Health   Financial Resource Strain: Not on file  Food Insecurity: Not on file  Transportation Needs: Not on file  Physical Activity: Not on file  Stress: Not on file  Social Connections: Not on file  Intimate Partner Violence: Not on file    Allergies  Allergen Reactions   Fruit Blend Other (See Comments)    Sore throat from eating acidic fresh fruits    No current facility-administered medications  on file prior to encounter.   Current Outpatient Medications on File Prior to Encounter  Medication Sig Dispense Refill   lidocaine (XYLOCAINE) 2 % solution Use as directed 15 mLs in the mouth or throat as needed (throat pain). 100 mL 0   norethindrone-ethinyl estradiol 1/35 ([place] 1/35, 28,) tablet Take 1 tablet by mouth daily. 28 tablet 11     ROS Pertinent positives and negative per HPI, all others reviewed and negative  Physical Exam   BP 122/68 (BP Location: Right Arm)   Pulse 83   Temp 98.5 F (36.9 C) (Oral)   Resp 16   Ht '5\' 6"'$  (1.676 m)   Wt 87.7 kg   LMP 02/12/2022   SpO2 99%   BMI 31.22 kg/m   Patient Vitals for the past 24 hrs:  BP Temp Temp src Pulse Resp SpO2 Height Weight  03/21/22 0840 122/68 98.5 F (36.9 C) Oral 83 16 99 % '5\' 6"'$  (1.676 m) 87.7 kg    Physical Exam Vitals reviewed.  Constitutional:      General: She is not in acute distress.    Appearance: She is well-developed. She is not diaphoretic.  Eyes:     General: No scleral icterus. Pulmonary:     Effort: Pulmonary effort is normal. No  respiratory distress.  Abdominal:     General: There is no distension.     Palpations: Abdomen is soft.     Tenderness: There is abdominal tenderness in the left lower quadrant. There is no guarding or rebound.  Skin:    General: Skin is warm and dry.  Neurological:     Mental Status: She is alert.     Coordination: Coordination normal.      Cervical Exam    Bedside Ultrasound Pt informed that the ultrasound is considered a limited OB ultrasound and is not intended to be a complete ultrasound exam.  Patient also informed that the ultrasound is not being completed with the intent of assessing for fetal or placental anomalies or any pelvic abnormalities.  Explained that the purpose of today's ultrasound is to assess for  viability.  Patient acknowledges the purpose of the exam and the limitations of the study.    My interpretation: intrauterine gestational sac visualized, possible yolk sac but poor resolution so not totally sure.    Labs Results for orders placed or performed during the hospital encounter of 03/21/22 (from the past 24 hour(s))  Pregnancy, urine POC     Status: Abnormal   Collection Time: 03/21/22  8:26 AM  Result Value Ref Range   Preg Test, Ur POSITIVE (A) NEGATIVE  Urinalysis, Routine w reflex microscopic -Urine, Clean Catch     Status: None   Collection Time: 03/21/22  8:50 AM  Result Value Ref Range   Color, Urine YELLOW YELLOW   APPearance CLEAR CLEAR   Specific Gravity, Urine 1.020 1.005 - 1.030   pH 7.5 5.0 - 8.0   Glucose, UA NEGATIVE NEGATIVE mg/dL   Hgb urine dipstick NEGATIVE NEGATIVE   Bilirubin Urine NEGATIVE NEGATIVE   Ketones, ur NEGATIVE NEGATIVE mg/dL   Protein, ur NEGATIVE NEGATIVE mg/dL   Nitrite NEGATIVE NEGATIVE   Leukocytes,Ua NEGATIVE NEGATIVE  Wet prep, genital     Status: Abnormal   Collection Time: 03/21/22  9:16 AM   Specimen: Vaginal  Result Value Ref Range   Yeast Wet Prep HPF POC NONE SEEN NONE SEEN   Trich, Wet Prep NONE  SEEN NONE SEEN   Clue Cells  Wet Prep HPF POC PRESENT (A) NONE SEEN   WBC, Wet Prep HPF POC <10 <10   Sperm NONE SEEN   ABO/Rh     Status: None (Preliminary result)   Collection Time: 03/21/22  9:32 AM  Result Value Ref Range   ABO/RH(D) PENDING     Imaging US OB LESS THAN 14 WEEKS WITH OB TRANSVAGINAL  Result Date: 03/21/2022 CLINICAL DATA:  Lower abdominal pain, vaginal spotting. EXAM: OBSTETRIC <14 WK [place] AND TRANSVAGINAL OB US TECHNIQUE: Both transabdominal and transvaginal ultrasound examinations were performed for complete evaluation of the gestation as well as the maternal uterus, adnexal regions, and pelvic cul-de-sac. Transvaginal technique was performed to assess early pregnancy. COMPARISON:  None Available. FINDINGS: Intrauterine gestational sac: Single Yolk sac:  Visualized. Embryo:  Not Visualized. Cardiac Activity: Not Visualized. MSD: 11.4 mm   5 w   6 d Subchorionic hemorrhage:  None visualized. Maternal uterus/adnexae: Ovaries are unremarkable. No free fluid is noted. 3.7 cm uterine fibroid is noted. IMPRESSION: Probable early intrauterine gestational sac with yolk sac, but no fetal pole or cardiac activity yet visualized. Recommend follow-up quantitative B-HCG levels and follow-up US in 14 days to assess viability. This recommendation follows SRU consensus guidelines: Diagnostic Criteria for Nonviable Pregnancy Early in the First Trimester. [place] Med [id number]. Electronically Signed   By: Marijo Conception M.D.   On: 03/21/2022 09:57    MAU Course  Procedures Lab Orders         Wet prep, genital         Urinalysis, Routine w reflex microscopic -Urine, Clean Catch         hCG, quantitative, pregnancy         Pregnancy, urine POC    Meds ordered this encounter  Medications   Prenatal 28-0.8 MG TABS    Sig: Take 1 tablet by mouth daily.    Dispense:  30 tablet    Refill:  12   polyethylene glycol powder (GLYCOLAX/MIRALAX) 17 GM/SCOOP powder    Sig: Take 17 g by  mouth daily as needed.    Dispense:  510 g    Refill:  1   Imaging Orders         US OB LESS THAN 14 WEEKS WITH OB TRANSVAGINAL     MDM moderate  Assessment and Plan  #Abdominal pain in pregnancy, first trimester #[redacted] weeks gestation of pregnancy #Vaginal bleeding in pregnancy, first trimester US shows viable IUP with yolk sac. We discussed that vaginal bleeding in the first trimester is common, and that 80-90% of patients will go on to have a normal pregnancy with a live delivery. The remainder are at increased risk for miscarriage, unfortunately there are no known interventions to mitigate this risk. Blood type A+, rhogam not indicated. We discussed return precautions including crescendo abdominal pain, heavy vaginal bleeding soaking >1 pad/hour, and fever.     Dispo: discharged to home in stable condition.    Clarnce Flock, MD/MPH 03/21/22 10:58 AM  Allergies as of 03/21/2022       Reactions   Fruit Blend Other (See Comments)   Sore throat from eating acidic fresh fruits        Medication List     STOP taking these medications    lidocaine 2 % solution Commonly known as: XYLOCAINE   Ortho-[employer] 1/35 (28) tablet Generic drug: norethindrone-ethinyl estradiol 1/35       TAKE these medications    polyethylene glycol powder 17  GM/SCOOP powder Commonly known as: GLYCOLAX/MIRALAX Take 17 g by mouth daily as needed.   Prenatal 28-0.8 MG Tabs Take 1 tablet by mouth daily.

## 2022-03-21 NOTE — MAU Note (Signed)
.  Darlene Livingston is a 32 y.o. at 13w2dhere in MAU reporting: left, lower abdominal cramping that is intermittent and started yesterday (4-5/10). She is also having intermittent cramping in her lower back. She reports a positive pregnancy test at home on 03/08/22 and a negative test on 03/16/22. She reports scant amount of light, pink spotting yesterday with wiping, but denies today.   LMP: 02/12/2022 Onset of complaint: Yesterday Pain score: 4-5/10 Vitals:   03/21/22 0840  BP: 122/68  Pulse: 83  Resp: 16  Temp: 98.5 F (36.9 C)  SpO2: 99%     FHT:Not indicated Lab orders placed from triage:  UA

## 2022-03-22 LAB — GC/CHLAMYDIA PROBE AMP (~~LOC~~) NOT AT ARMC
Chlamydia: NEGATIVE
Comment: NEGATIVE
Comment: NORMAL
Neisseria Gonorrhea: NEGATIVE

## 2022-04-17 ENCOUNTER — Other Ambulatory Visit: Payer: Self-pay

## 2022-04-17 DIAGNOSIS — O209 Hemorrhage in early pregnancy, unspecified: Secondary | ICD-10-CM

## 2022-04-25 ENCOUNTER — Other Ambulatory Visit: Payer: Self-pay | Admitting: Obstetrics and Gynecology

## 2022-04-25 ENCOUNTER — Ambulatory Visit (HOSPITAL_COMMUNITY)
Admission: RE | Admit: 2022-04-25 | Discharge: 2022-04-25 | Disposition: A | Discharge status: Home / Self Care | Payer: Medicaid Other | Source: Ambulatory Visit | Attending: Obstetrics and Gynecology | Admitting: Obstetrics and Gynecology

## 2022-04-25 DIAGNOSIS — O209 Hemorrhage in early pregnancy, unspecified: Secondary | ICD-10-CM

## 2022-05-16 ENCOUNTER — Inpatient Hospital Stay (HOSPITAL_COMMUNITY)
Admission: AD | Admit: 2022-05-16 | Discharge: 2022-05-16 | Disposition: A | Discharge status: Home / Self Care | Payer: Medicaid Other | Attending: Obstetrics & Gynecology | Admitting: Obstetrics & Gynecology

## 2022-05-16 DIAGNOSIS — Z659 Problem related to unspecified psychosocial circumstances: Secondary | ICD-10-CM

## 2022-05-16 DIAGNOSIS — O26891 Other specified pregnancy related conditions, first trimester: Secondary | ICD-10-CM | POA: Diagnosis present

## 2022-05-16 DIAGNOSIS — O26892 Other specified pregnancy related conditions, second trimester: Secondary | ICD-10-CM

## 2022-05-16 DIAGNOSIS — Z3A13 13 weeks gestation of pregnancy: Secondary | ICD-10-CM

## 2022-05-16 DIAGNOSIS — M25552 Pain in left hip: Secondary | ICD-10-CM | POA: Diagnosis not present

## 2022-05-16 DIAGNOSIS — O99891 Other specified diseases and conditions complicating pregnancy: Secondary | ICD-10-CM | POA: Insufficient documentation

## 2022-05-16 DIAGNOSIS — M25551 Pain in right hip: Secondary | ICD-10-CM

## 2022-05-16 DIAGNOSIS — R102 Pelvic and perineal pain: Secondary | ICD-10-CM | POA: Insufficient documentation

## 2022-05-16 DIAGNOSIS — N949 Unspecified condition associated with female genital organs and menstrual cycle: Secondary | ICD-10-CM | POA: Diagnosis not present

## 2022-05-16 LAB — URINALYSIS, ROUTINE W REFLEX MICROSCOPIC
Bacteria, UA: NONE SEEN
Bilirubin Urine: NEGATIVE
Glucose, UA: NEGATIVE mg/dL
Hgb urine dipstick: NEGATIVE
Ketones, ur: NEGATIVE mg/dL
Leukocytes,Ua: NEGATIVE
Nitrite: NEGATIVE
Protein, ur: NEGATIVE mg/dL
Specific Gravity, Urine: 1.029 (ref 1.005–1.030)
pH: 5 (ref 5.0–8.0)

## 2022-05-16 NOTE — Progress Notes (Signed)
Wynelle Bourgeois CNM in Triage to see pt and do bedside u/s. Pt reassured and comfortable with going home. WRitten and verbal d/c instructions given and understanding voiced

## 2022-05-16 NOTE — Progress Notes (Signed)
Written and verbal d/c instructions given and understanding voiced. 
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
## 2022-05-18 ENCOUNTER — Telehealth (HOSPITAL_COMMUNITY): Payer: Self-pay | Admitting: *Deleted

## 2022-06-13 ENCOUNTER — Telehealth (INDEPENDENT_AMBULATORY_CARE_PROVIDER_SITE_OTHER): Payer: Medicaid Other

## 2022-06-13 DIAGNOSIS — Z3689 Encounter for other specified antenatal screening: Secondary | ICD-10-CM

## 2022-06-13 DIAGNOSIS — Z22330 Carrier of Group B streptococcus: Secondary | ICD-10-CM | POA: Insufficient documentation

## 2022-06-13 DIAGNOSIS — Z3480 Encounter for supervision of other normal pregnancy, unspecified trimester: Secondary | ICD-10-CM | POA: Insufficient documentation

## 2022-06-13 MED ORDER — BLOOD PRESSURE KIT DEVI
1.0000 | 0 refills | Status: DC | PRN
Start: 2022-06-13 — End: 2022-08-22

## 2022-06-13 NOTE — Patient Instructions (Addendum)
Safe Medications in Pregnancy   Acne:  Benzoyl Peroxide  Salicylic Acid   Backache/Headache:  Tylenol: 2 regular strength every 4 hours OR               2 Extra strength every 6 hours   Colds/Coughs/Allergies:  Benadryl (alcohol free) 25 mg every 6 hours as needed  Breath right strips  Claritin  Cepacol throat lozenges  Chloraseptic throat spray  Cold-Eeze- up to three times per day  Cough drops, alcohol free  Flonase (by prescription only)  Guaifenesin  Mucinex  Robitussin DM (plain only, alcohol free)  Saline nasal spray/drops  Sudafed (pseudoephedrine) & Actifed * use only after [redacted] weeks gestation and if you do not have high blood pressure  Tylenol  Vicks Vaporub  Zinc lozenges  Zyrtec   Constipation:  Colace  Ducolax suppositories  Fleet enema  Glycerin suppositories  Metamucil  Milk of magnesia  Miralax  Senokot  Smooth move tea   Diarrhea:  Kaopectate  Imodium A-D   *NO pepto Bismol   Hemorrhoids:  Anusol  Anusol HC  Preparation H  Tucks   Indigestion:  Tums  Maalox  Mylanta  Zantac  Pepcid   Insomnia:  Benadryl (alcohol free) 25mg every 6 hours as needed  Tylenol PM  Unisom, no Gelcaps   Leg Cramps:  Tums  MagGel   Nausea/Vomiting:  Bonine  Dramamine  Emetrol  Ginger extract  Sea bands  Meclizine  Nausea medication to take during pregnancy:  Unisom (doxylamine succinate 25 mg tablets) Take one tablet daily at bedtime. If symptoms are not adequately controlled, the dose can be increased to a maximum recommended dose of two tablets daily (1/2 tablet in the morning, 1/2 tablet mid-afternoon and one at bedtime).  Vitamin B6 100mg tablets. Take one tablet twice a day (up to 200 mg per day).   Skin Rashes:  Aveeno products  Benadryl cream or 25mg every 6 hours as needed  Calamine Lotion  1% cortisone cream   Yeast infection:  Gyne-lotrimin 7  Monistat 7    **If taking multiple medications, please check labels to avoid  duplicating the same active ingredients  **take medication as directed on the label  ** Do not exceed 4000 mg of tylenol in 24 hours  **Do not take medications that contain aspirin or ibuprofen             Considering Waterbirth? Guide for patients at [facility] ([facility]) Why consider waterbirth? Gentle birth for babies  Less pain medicine used in labor  May allow for passive descent/less pushing  May reduce perineal tears  More mobility and instinctive maternal position changes  Increased maternal relaxation   Is waterbirth safe? What are the risks of infection, drowning or other complications? Infection:  Very low risk (3.7 % for tub vs 4.8% for bed)  7 in 8000 waterbirths with documented infection  Poorly cleaned equipment most common cause  Slightly lower group B strep transmission rate  Drowning  Maternal:  Very low risk  Related to seizures or fainting  Newborn:  Very low risk. No evidence of increased risk of respiratory problems in multiple large studies  Physiological protection from breathing under water  Avoid underwater birth if there are any fetal complications  Once baby's head is out of the water, keep it out.  Birth complication  Some reports of cord trauma, but risk decreased by bringing baby to surface gradually  No evidence of increased risk of shoulder dystocia.   Mothers can usually change positions faster in water than in a bed, possibly aiding the maneuvers to free the shoulder.   There are 2 things you MUST do to have a waterbirth with [facility]: Attend a waterbirth class at [facility] [place]   3rd Wednesday of every month from 7-9 pm (virtual during COVID) Free Register online at [email or web address] or [email or web address] or by calling [phone] Bring us the certificate from the class to your prenatal appointment or send via MyChart Meet with a midwife at 36 weeks* to see if you can still plan a  waterbirth and to sign the consent.   *We also recommend that you schedule as many of your prenatal visits with a midwife as possible.    Helpful information: You may want to bring a bathing suit top to the hospital to wear during labor but this is optional.  All other supplies are provided by the hospital. Please arrive at the hospital with signs of active labor, and do not wait at home until late in labor. It takes 45 min- 1 hour for fetal monitoring, and check in to your room to take place, plus transport and filling of the waterbirth tub.    Things that would prevent you from having a waterbirth: Premature, <37wks  Previous cesarean birth  Presence of thick meconium-stained fluid  Multiple gestation (Twins, triplets, etc.)  Uncontrolled diabetes or gestational diabetes requiring medication  Hypertension diagnosed in pregnancy or preexisting hypertension (gestational hypertension, preeclampsia, or chronic hypertension) Fetal growth restriction (your baby measures less than 10th percentile on ultrasound) Heavy vaginal bleeding  Non-reassuring fetal heart rate  Active infection (MRSA, etc.). Group B Strep is NOT a contraindication for waterbirth.  If your labor has to be induced and induction method requires continuous monitoring of the baby's heart rate  Other risks/issues identified by your obstetrical provider   Please remember that birth is unpredictable. Under certain unforeseeable circumstances your provider may advise against giving birth in the tub. These decisions will be made on a case-by-case basis and with the safety of you and your baby as our highest priority.     

## 2022-06-13 NOTE — Progress Notes (Signed)
New OB Intake  I connected with Darlene Livingston  on 06/13/22 at 11:15 AM EDT by MyChart Video Visit and verified that I am speaking with the correct person using two identifiers. Nurse is located at [facility] and pt is located at home.  I discussed the limitations, risks, security and privacy concerns of performing an evaluation and management service by telephone and the availability of in person appointments. I also discussed with the patient that there may be a patient responsible charge related to this service. The patient expressed understanding and agreed to proceed.  I explained I am completing New OB Intake today. We discussed EDD of 11/19/2022 that is based on LMP of 02/12/2022. Pt is G6/P2. I reviewed her allergies, medications, Medical/Surgical/OB history, and appropriate screenings. I informed her of [facility] services. [facility] information placed in AVS. Based on history, this is a low risk pregnancy.  Patient Active Problem List   Diagnosis Date Noted   Status post therapeutic abortion on 07/19/20 07/23/2020    Concerns addressed today  Delivery Plans Plans to deliver at [facility]. Patient given information for [facility] website for more information about [facility]. Patient is interested in water birth. Offered upcoming OB visit with CNM to discuss further.  MyChart/Babyscripts MyChart access verified. I explained pt will have some visits in office and some virtually. Babyscripts instructions given and order placed. Patient verifies receipt of registration text/e-mail. Account successfully created and app downloaded.  Blood Pressure Cuff/Weight Scale Blood pressure cuff ordered for patient to pick-up from [employer]. Explained after first prenatal appt pt will check weekly and document in Babyscripts.  Anatomy US Explained first scheduled [place] will be around 19 weeks. Anatomy US scheduled for 07/17/2022 at 1:30pm. Pt notified to arrive at  1:15pm.  Labs Discussed Avelina Laine genetic screening with patient. Would like both Panorama and Horizon drawn at new OB visit. Routine prenatal labs needed.  COVID Vaccine Patient has had COVID vaccine.   Is patient a CenteringPregnancy candidate?  Accepted   Is patient a Mom+Baby Combined Care candidate?  Not a candidate   If accepted, Mom+Baby staff notified  Social Determinants of Health Food Insecurity: Patient denies food insecurity. WIC Referral: Patient is interested in referral to [facility].  Transportation: Patient denies transportation needs. Childcare: Discussed no children allowed at ultrasound appointments. Offered childcare services; patient declines childcare services at this time.  Interested in [place]? If yes, send referral and doula dot phrase.   First visit review I reviewed new OB appt with patient. I explained they will have a provider visit that includes labs, pap smear and listening to baby heartbeat. Explained pt will be seen by Dr. Earlene Plater at first visit; encounter routed to appropriate provider. Explained that patient will be seen by pregnancy navigator following visit with provider.   Lowry Bowl, CMA 06/13/2022  11:01 AM

## 2022-06-17 ENCOUNTER — Encounter: Payer: Self-pay | Admitting: Obstetrics & Gynecology

## 2022-06-17 ENCOUNTER — Encounter (HOSPITAL_COMMUNITY): Payer: Self-pay | Admitting: Obstetrics & Gynecology

## 2022-06-17 ENCOUNTER — Inpatient Hospital Stay (HOSPITAL_COMMUNITY)
Admission: AD | Admit: 2022-06-17 | Discharge: 2022-06-17 | Disposition: A | Discharge status: Home / Self Care | Payer: Medicaid Other | Attending: Obstetrics & Gynecology | Admitting: Obstetrics & Gynecology

## 2022-06-17 DIAGNOSIS — Z3A17 17 weeks gestation of pregnancy: Secondary | ICD-10-CM | POA: Diagnosis not present

## 2022-06-17 DIAGNOSIS — M25552 Pain in left hip: Secondary | ICD-10-CM | POA: Diagnosis present

## 2022-06-17 DIAGNOSIS — O99612 Diseases of the digestive system complicating pregnancy, second trimester: Secondary | ICD-10-CM | POA: Insufficient documentation

## 2022-06-17 DIAGNOSIS — M5430 Sciatica, unspecified side: Secondary | ICD-10-CM | POA: Diagnosis not present

## 2022-06-17 DIAGNOSIS — R12 Heartburn: Secondary | ICD-10-CM | POA: Diagnosis not present

## 2022-06-17 DIAGNOSIS — O26892 Other specified pregnancy related conditions, second trimester: Secondary | ICD-10-CM | POA: Diagnosis not present

## 2022-06-17 HISTORY — DX: Sciatica, unspecified side: M54.30

## 2022-06-17 LAB — URINALYSIS, ROUTINE W REFLEX MICROSCOPIC
Glucose, UA: NEGATIVE mg/dL
Hgb urine dipstick: NEGATIVE
Ketones, ur: 5 mg/dL — AB
Leukocytes,Ua: NEGATIVE
Nitrite: NEGATIVE
Protein, ur: 30 mg/dL — AB
Specific Gravity, Urine: 1.03 (ref 1.005–1.030)
pH: 5 (ref 5.0–8.0)

## 2022-06-17 MED ORDER — IBUPROFEN 800 MG PO TABS: 800.0000 mg | ORAL_TABLET | Freq: Three times a day (TID) | ORAL | 0 refills | Status: DC | PRN

## 2022-06-17 MED ORDER — CYCLOBENZAPRINE HCL 10 MG PO TABS: 10.0000 mg | ORAL_TABLET | Freq: Three times a day (TID) | ORAL | 3 refills | Status: DC | PRN

## 2022-06-17 MED ORDER — CALCIUM CARBONATE ANTACID 500 MG PO CHEW
2.0000 | CHEWABLE_TABLET | Freq: Once | ORAL | Status: AC
  Administered 2022-06-17: 400 mg via ORAL
  Filled 2022-06-17: qty 2

## 2022-06-17 MED ORDER — ACETAMINOPHEN 500 MG PO TABS: 1000.0000 mg | ORAL_TABLET | Freq: Four times a day (QID) | ORAL | 0 refills | Status: DC | PRN

## 2022-06-17 MED ORDER — FAMOTIDINE 20 MG PO TABS: 20.0000 mg | ORAL_TABLET | Freq: Two times a day (BID) | ORAL | 3 refills | Status: DC

## 2022-06-17 MED ORDER — CYCLOBENZAPRINE HCL 5 MG PO TABS
10.0000 mg | ORAL_TABLET | Freq: Once | ORAL | Status: AC
  Administered 2022-06-17: 10 mg via ORAL
  Filled 2022-06-17: qty 2

## 2022-06-17 MED ORDER — KETOROLAC TROMETHAMINE 60 MG/2ML IM SOLN
60.0000 mg | Freq: Once | INTRAMUSCULAR | Status: AC
  Administered 2022-06-17: 60 mg via INTRAMUSCULAR
  Filled 2022-06-17: qty 2

## 2022-06-17 NOTE — Discharge Instructions (Signed)
You will be called with appointments for Physical Therapy

## 2022-06-17 NOTE — MAU Provider Note (Signed)
Faculty Practice OB/GYN Attending MAU Note  Chief Complaint: Leg Pain    Event Date/Time   First Provider Initiated Contact with Patient 06/17/22 2023      SUBJECTIVE Darlene Livingston is a 32 y.o. [id number] at [redacted]w[redacted]d by LMP who presents with pain in her left hip that shoots down her leg. Makes it difficult to walk. Has taken Tylenol, and applied heat and ice without relief.  Hard to bear weight on it.  Also reports unrelated heartburn symptom, has not taken anything for this.   Denies any VB, LOF, contractions.  Denies any abnormal vaginal discharge, fevers, chills, sweats, dysuria, nausea, vomiting, other GI or GU or other general symptoms.     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   Headache(784.0)    Miscarriage    x2, one at 13weeks, one at [redacted] weeks gestation   OB History  Gravida Para Term Preterm AB Living  6 2 2  0 3 2  SAB IAB Ectopic Multiple Live Births  2 1 0 0 2    # Outcome Date GA Lbr Len/2nd Weight Sex Delivery Anes PTL Lv  6 Current           5 IAB 07/19/20 [redacted]w[redacted]d    TAB     4 Term 08/31/14 [redacted]w[redacted]d 11:06 / 00:16 2985 g F Vag-Spont EPI  LIV  3 Term 02/22/12 [redacted]w[redacted]d 13:06 / 00:18 2805 g F Vag-Spont EPI  LIV     Birth Comments: WNL  2 SAB           1 SAB            Past Surgical History:  Procedure Laterality Date   THERAPEUTIC ABORTION  07/19/2020   Done at [employer] [facility], [redacted] weeks gestation   Social History   Socioeconomic History   Marital status: Single    Spouse name: Not on file   Number of children: Not on file   Years of education: Not on file   Highest education level: Not on file  Occupational History   Not on file  Tobacco Use   Smoking status: Never   Smokeless tobacco: Never  Vaping Use   Vaping Use: Never used  Substance and Sexual Activity   Alcohol use: No    Comment: social   Drug use: Not Currently    Comment: Delta 8 CBD   Sexual activity: Yes     Partners: Male    Birth control/protection: None  Other Topics Concern   Not on file  Social History Narrative   Not on file   Social Determinants of Health   Financial Resource Strain: Not on file  Food Insecurity: Not on file  Transportation Needs: Not on file  Physical Activity: Not on file  Stress: Not on file  Social Connections: Not on file  Intimate Partner Violence: Not on file   No current facility-administered medications on file prior to encounter.   Current Outpatient Medications on File Prior to Encounter  Medication Sig Dispense Refill   acetaminophen (TYLENOL) 325 MG tablet Take 1,000 mg by mouth every 6 (six) hours as needed.     Prenatal 28-0.8 MG TABS Take 1 tablet by mouth daily. 30 tablet 12   Blood Pressure Monitoring (BLOOD PRESSURE KIT) DEVI 1 each by Does not apply route as needed. 1 each 0   Doxylamine-Pyridoxine (DICLEGIS) 10-10 MG TBEC Take 2 tablets  every day by oral route. (Patient not taking: Reported on 06/13/2022)     HYDROcodone-acetaminophen (NORCO/VICODIN) 5-325 MG tablet  (Patient not taking: Reported on 06/13/2022)     norethindrone (AYGESTIN) 5 MG tablet TAKE 1 TABLET BY MOUTH THREE TIMES A DAY FOR 7 DAYS (Patient not taking: Reported on 06/13/2022)     polyethylene glycol powder (GLYCOLAX/MIRALAX) 17 GM/SCOOP powder Take 17 g by mouth daily as needed. (Patient not taking: Reported on 05/16/2022) 510 g 1   Allergies  Allergen Reactions   Fruit Blend Other (See Comments)    Sore throat from eating acidic fresh fruits   Other Other (See Comments)    Sore throat from eating acidic fresh fruits    ROS: Pertinent items in HPI  OBJECTIVE BP 125/66   Pulse (!) 101   Temp 99 F (37.2 C)   Ht 5\' 6"  (1.676 m)   Wt 92.5 kg   LMP 02/12/2022   BMI 32.93 kg/m  FHR 152 bpm CONSTITUTIONAL: Well-developed, well-nourished female in some pain due to her leg. HENT:  Normocephalic, atraumatic, External right and left ear normal. Oropharynx is clear and  moist EYES: Conjunctivae and EOM are normal. Pupils are equal, round, and reactive to light. No scleral icterus.  NECK: Normal range of motion, supple, no masses.  Normal thyroid.  SKIN: Skin is warm and dry. No rash noted. Not diaphoretic. No erythema. No pallor. NEUROLGIC: Alert and oriented to person, place, and time. Normal reflexes, muscle tone coordination. No cranial nerve deficit noted. PSYCHIATRIC: Normal mood and affect. Normal behavior. Normal judgment and thought content. CARDIOVASCULAR: Normal heart rate noted RESPIRATORY: Effort and breath sounds normal, no problems with respiration noted. ABDOMEN: Soft, normal bowel sounds, no distention noted.  No tenderness, rebound or guarding.  PELVIC:Deferred MUSCULOSKELETAL: Normal range of motion but tender on movement of left leg. Some point tenderness on palpation of her posterior iliac crest.   No calf tenderness. No edema noted, looks symmetric.  2+ distal pulses.  LAB RESULTS Results for orders placed or performed during the hospital encounter of 06/17/22 (from the past 48 hour(s))  Urinalysis, Routine w reflex microscopic -Urine, Clean Catch     Status: Abnormal   Collection Time: 06/17/22  7:41 PM  Result Value Ref Range   Color, Urine AMBER (A) YELLOW    Comment: BIOCHEMICALS MAY BE AFFECTED BY COLOR   APPearance HAZY (A) CLEAR   Specific Gravity, Urine 1.030 1.005 - 1.030   pH 5.0 5.0 - 8.0   Glucose, UA NEGATIVE NEGATIVE mg/dL   Hgb urine dipstick NEGATIVE NEGATIVE   Bilirubin Urine SMALL (A) NEGATIVE   Ketones, ur 5 (A) NEGATIVE mg/dL   Protein, ur 30 (A) NEGATIVE mg/dL   Nitrite NEGATIVE NEGATIVE   Leukocytes,Ua NEGATIVE NEGATIVE   RBC / HPF 0-5 0 - 5 RBC/hpf   WBC, UA 0-5 0 - 5 WBC/hpf   Bacteria, UA RARE (A) NONE SEEN   Squamous Epithelial / HPF 0-5 0 - 5 /HPF   Mucus PRESENT     Comment: Performed at [facility], [address], [place], [place] [address]    IMAGING No results found.  MAU  COURSE Flexeril 10 mg po x 1 and Toradol 60 mg IM x 1 ordered for patient>>patient felt much better Tums also given to patient for heartburn  ASSESSMENT 1. Sciatic left leg pain   2. Heartburn during pregnancy in second trimester   3. [redacted] weeks gestation of pregnancy  PLAN Flexeril and 7 day course of Ibuprofen prescribed for patient, can take with Tylenol as needed Message sent to [facility] to help with arranging for patient to get physical therapy sessions, this was ordered for patient Work note given to allow for patient to sit more and avoid climbing stairs if possible given her pain. Discharged to home in stable condition.  Allergies as of 06/17/2022       Reactions   Fruit Blend Other (See Comments)   Sore throat from eating acidic fresh fruits   Other Other (See Comments)   Sore throat from eating acidic fresh fruits        Medication List     STOP taking these medications    Diclegis 10-10 MG Tbec Generic drug: Doxylamine-Pyridoxine   HYDROcodone-acetaminophen 5-325 MG tablet Commonly known as: NORCO/VICODIN   norethindrone 5 MG tablet Commonly known as: AYGESTIN   polyethylene glycol powder 17 GM/SCOOP powder Commonly known as: GLYCOLAX/MIRALAX       TAKE these medications    acetaminophen 500 MG tablet Commonly known as: TYLENOL Take 2 tablets (1,000 mg total) by mouth every 6 (six) hours as needed for moderate pain or mild pain. What changed:  medication strength reasons to take this   Blood Pressure Kit Devi 1 each by Does not apply route as needed.   cyclobenzaprine 10 MG tablet Commonly known as: FLEXERIL Take 1 tablet (10 mg total) by mouth 3 (three) times daily as needed for muscle spasms (leg pain). What changed:  medication strength See the new instructions.   famotidine 20 MG tablet Commonly known as: PEPCID Take 1 tablet (20 mg total) by mouth 2 (two) times daily.   ibuprofen 800 MG tablet Commonly known as: ADVIL Take 1 tablet  (800 mg total) by mouth 3 (three) times daily with meals as needed for headache, moderate pain or cramping.   Prenatal 28-0.8 MG Tabs Take 1 tablet by mouth daily.        Evaluation does not show pathology that would require ongoing emergent intervention or inpatient treatment. Patient is hemodynamically stable and mentating appropriately. Discussed findings and plan with patient, who agrees with care plan. All questions answered. Return precautions discussed and outpatient follow up recommendations given.  Tereso Newcomer, MD 06/17/2022 9:33 PM

## 2022-06-17 NOTE — MAU Note (Signed)
.  Darlene Livingston is a 32 y.o. at [redacted]w[redacted]d here in MAU reporting: pain in her left hip that shoots down her leg. "Might be sciatic pain " Has taken tylenol  and heat and ice without relief. Stated her feet started to swell today and her left ankle is hard to bear weight on. LMP:  Onset of complaint: today Pain score: 7-8 Vitals:   06/17/22 1956  BP: 125/66  Pulse: (!) 101  Temp: 99 F (37.2 C)     FHT:152 Lab orders placed from triage:  u/a

## 2022-06-25 ENCOUNTER — Other Ambulatory Visit (HOSPITAL_COMMUNITY)
Admission: RE | Admit: 2022-06-25 | Discharge: 2022-06-25 | Disposition: A | Discharge status: Home / Self Care | Payer: Medicaid Other | Source: Ambulatory Visit | Attending: Obstetrics and Gynecology | Admitting: Obstetrics and Gynecology

## 2022-06-25 ENCOUNTER — Ambulatory Visit (INDEPENDENT_AMBULATORY_CARE_PROVIDER_SITE_OTHER): Payer: Medicaid Other | Admitting: Obstetrics and Gynecology

## 2022-06-25 ENCOUNTER — Encounter: Payer: Self-pay | Admitting: Obstetrics and Gynecology

## 2022-06-25 ENCOUNTER — Other Ambulatory Visit: Payer: Self-pay

## 2022-06-25 VITALS — BP 115/74 | HR 94 | Wt 202.5 lb

## 2022-06-25 DIAGNOSIS — A6000 Herpesviral infection of urogenital system, unspecified: Secondary | ICD-10-CM

## 2022-06-25 DIAGNOSIS — Z3480 Encounter for supervision of other normal pregnancy, unspecified trimester: Secondary | ICD-10-CM | POA: Diagnosis present

## 2022-06-25 DIAGNOSIS — N898 Other specified noninflammatory disorders of vagina: Secondary | ICD-10-CM | POA: Diagnosis present

## 2022-06-25 DIAGNOSIS — Z3482 Encounter for supervision of other normal pregnancy, second trimester: Secondary | ICD-10-CM | POA: Diagnosis not present

## 2022-06-25 DIAGNOSIS — M5430 Sciatica, unspecified side: Secondary | ICD-10-CM

## 2022-06-25 DIAGNOSIS — Z3A19 19 weeks gestation of pregnancy: Secondary | ICD-10-CM | POA: Diagnosis not present

## 2022-06-25 DIAGNOSIS — D259 Leiomyoma of uterus, unspecified: Secondary | ICD-10-CM

## 2022-06-25 NOTE — Progress Notes (Signed)
Pt reports problem in left leg due to Sciatica/ Pt called her to schedule appt but she was not sure. Pt will call them back to reschedule.

## 2022-06-25 NOTE — Progress Notes (Signed)
INITIAL PRENATAL VISIT NOTE  Subjective:  Darlene Livingston is a 32 y.o. [id number] at [redacted]w[redacted]d by LMP being seen today for her initial prenatal visit. She has an obstetric history significant for 2 x term SVD. She has a medical history significant for genital HSV.  Patient reports  leg pain  Had spotting a few weeks ago and has not had anything since. She is going to call for PT, had previously been ordered.   Contractions: Not present. Vag. Bleeding: None.  Movement: Present. Denies leaking of fluid.    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   Headache(784.0)    Miscarriage    x2, one at 13weeks, one at [redacted] weeks gestation    Past Surgical History:  Procedure Laterality Date   THERAPEUTIC ABORTION  07/19/2020   Done at [employer] [facility], [redacted] weeks gestation    OB History  Gravida Para Term Preterm AB Living  6 2 2  0 3 2  SAB IAB Ectopic Multiple Live Births  2 1 0 0 2    # Outcome Date GA Lbr Len/2nd Weight Sex Delivery Anes PTL Lv  6 Current           5 IAB 07/19/20 [redacted]w[redacted]d    TAB     4 Term 08/31/14 [redacted]w[redacted]d 11:06 / 00:16 2.985 kg F Vag-Spont EPI  LIV  3 Term 02/22/12 [redacted]w[redacted]d 13:06 / 00:18 2.805 kg F Vag-Spont EPI  LIV     Birth Comments: WNL  2 SAB           1 SAB             Social History   Socioeconomic History   Marital status: Single    Spouse name: Not on file   Number of children: Not on file   Years of education: Not on file   Highest education level: Not on file  Occupational History   Not on file  Tobacco Use   Smoking status: Never   Smokeless tobacco: Never  Vaping Use   Vaping Use: Never used  Substance and Sexual Activity   Alcohol use: No    Comment: social   Drug use: Not Currently    Comment: Delta 8 CBD   Sexual activity: Yes    Partners: Male    Birth control/protection: None  Other Topics Concern   Not on file  Social History Narrative   Not on file    Social Determinants of Health   Financial Resource Strain: Not on file  Food Insecurity: Not on file  Transportation Needs: Not on file  Physical Activity: Not on file  Stress: Not on file  Social Connections: Not on file    Family History  Problem Relation Age of Onset   Hypertension Mother    Depression Maternal Aunt    Diabetes Maternal Aunt    Depression Maternal Uncle    Diabetes Maternal Uncle    Asthma Paternal Aunt    Cancer Paternal Aunt    Depression Paternal Aunt    Diabetes Paternal Aunt    Asthma Paternal Uncle    Depression Paternal Uncle    Diabetes Paternal Uncle    Birth defects Cousin    Anesthesia problems Neg Hx    Hypotension Neg Hx    Malignant hyperthermia Neg Hx    Pseudochol deficiency Neg Hx      Current Outpatient Medications:  acetaminophen (TYLENOL) 500 MG tablet, Take 2 tablets (1,000 mg total) by mouth every 6 (six) hours as needed for moderate pain or mild pain., Disp: 100 tablet, Rfl: 0   cyclobenzaprine (FLEXERIL) 10 MG tablet, Take 1 tablet (10 mg total) by mouth 3 (three) times daily as needed for muscle spasms (leg pain)., Disp: 30 tablet, Rfl: 3   famotidine (PEPCID) 20 MG tablet, Take 1 tablet (20 mg total) by mouth 2 (two) times daily., Disp: 60 tablet, Rfl: 3   metroNIDAZOLE (FLAGYL) 500 MG tablet, Take 1 tablet (500 mg total) by mouth 2 (two) times daily for 7 days., Disp: 14 tablet, Rfl: 0   Prenatal 28-0.8 MG TABS, Take 1 tablet by mouth daily., Disp: 30 tablet, Rfl: 12   Blood Pressure Monitoring (BLOOD PRESSURE KIT) DEVI, 1 each by Does not apply route as needed., Disp: 1 each, Rfl: 0   ibuprofen (ADVIL) 800 MG tablet, Take 1 tablet (800 mg total) by mouth 3 (three) times daily with meals as needed for headache, moderate pain or cramping. (Patient not taking: Reported on 06/25/2022), Disp: 20 tablet, Rfl: 0  Allergies  Allergen Reactions   Fruit Blend Other (See Comments)    Sore throat from eating acidic fresh fruits    Other Other (See Comments)    Sore throat from eating acidic fresh fruits   Review of Systems: Negative except for what is mentioned in HPI.  Objective:   Vitals:   06/25/22 1522  BP: 115/74  Pulse: 94  Weight: 91.9 kg    Fetal Status: Fetal Heart Rate (bpm): 164   Movement: Present     Physical Exam: BP 115/74   Pulse 94   Wt 91.9 kg   LMP 02/12/2022   BMI 32.68 kg/m  CONSTITUTIONAL: Well-developed, well-nourished female in no acute distress.  NEUROLOGIC: Alert and oriented to person, place, and time. Normal reflexes, muscle tone coordination. No cranial nerve deficit noted. PSYCHIATRIC: Normal mood and affect. Normal behavior. Normal judgment and thought content. SKIN: Skin is warm and dry. No rash noted. Not diaphoretic. No erythema. No pallor. HENT:  Normocephalic, atraumatic, External right and left ear normal. Oropharynx is clear and moist EYES: Conjunctivae and EOM are normal. Pupils are equal, round, and reactive to light. No scleral icterus.  NECK: Normal range of motion, supple, no masses CARDIOVASCULAR: Normal heart rate noted RESPIRATORY: Effort normal, no problems with respiration noted BREASTS: deferred ABDOMEN: Soft, nontender, nondistended, gravid. GU: normal appearing external female genitalia, multiparous normal appearing cervix, scant white discharge in vagina, no lesions noted Bimanual: deferred MUSCULOSKELETAL:  limited left hip motion due to pain. EXT:  No edema and no tenderness.   Assessment and Plan:  Pregnancy: [id number] at [redacted]w[redacted]d by LMP   1. Supervision of other normal pregnancy, antepartum Reviewed [facility] [employer] structure, multiple providers, fellows, medical students, virtual visits, MyChart.  - Culture, OB Urine - CBC/D/Plt+RPR+Rh+ABO+RubIgG... - Hemoglobin A1c - PANORAMA PRENATAL TEST - [place] Basic Panel - Cytology - PAP( [place])  2. Genital herpes simplex, unspecified site No outbreak in years Will  need ppx at 36 weeks  3. Vaginal discharge - Cervicovaginal ancillary only( [place])  4. [redacted] weeks gestation of pregnancy  5. Uterine leiomyoma, unspecified location Will monitor  6. Sciatic leg pain Referred to PT  Preterm labor symptoms and general obstetric precautions including but not limited to vaginal bleeding, contractions, leaking of fluid and fetal movement were reviewed in detail with the patient.  Please refer to  After Visit Summary for other counseling recommendations.   Return in about 4 weeks (around 07/23/2022) for low OB.  Conan Bowens 06/30/2022 9:22 AM

## 2022-06-26 ENCOUNTER — Other Ambulatory Visit: Payer: Self-pay | Admitting: Family Medicine

## 2022-06-26 ENCOUNTER — Encounter: Payer: Self-pay | Admitting: *Deleted

## 2022-06-26 ENCOUNTER — Encounter: Payer: Self-pay | Admitting: Physical Therapy

## 2022-06-26 ENCOUNTER — Other Ambulatory Visit: Payer: Self-pay

## 2022-06-26 ENCOUNTER — Ambulatory Visit: Payer: Medicaid Other | Attending: Obstetrics & Gynecology | Admitting: Physical Therapy

## 2022-06-26 DIAGNOSIS — M5430 Sciatica, unspecified side: Secondary | ICD-10-CM | POA: Diagnosis not present

## 2022-06-26 DIAGNOSIS — M5432 Sciatica, left side: Secondary | ICD-10-CM | POA: Insufficient documentation

## 2022-06-26 DIAGNOSIS — M6281 Muscle weakness (generalized): Secondary | ICD-10-CM | POA: Diagnosis present

## 2022-06-26 LAB — CBC/D/PLT+RPR+RH+ABO+RUBIGG...
Antibody Screen: NEGATIVE
Basophils Absolute: 0 10*3/uL (ref 0.0–0.2)
Basos: 0 %
EOS (ABSOLUTE): 0 10*3/uL (ref 0.0–0.4)
Eos: 1 %
HCV Ab: NONREACTIVE
HIV Screen 4th Generation wRfx: NONREACTIVE
Hematocrit: 29.2 % — ABNORMAL LOW (ref 34.0–46.6)
Hemoglobin: 10.2 g/dL — ABNORMAL LOW (ref 11.1–15.9)
Hepatitis B Surface Ag: NEGATIVE
Immature Grans (Abs): 0 10*3/uL (ref 0.0–0.1)
Immature Granulocytes: 0 %
Lymphocytes Absolute: 1.4 10*3/uL (ref 0.7–3.1)
Lymphs: 29 %
MCH: 31.1 pg (ref 26.6–33.0)
MCHC: 34.9 g/dL (ref 31.5–35.7)
MCV: 89 fL (ref 79–97)
Monocytes Absolute: 0.5 10*3/uL (ref 0.1–0.9)
Monocytes: 9 %
Neutrophils Absolute: 2.9 10*3/uL (ref 1.4–7.0)
Neutrophils: 61 %
Platelets: 290 10*3/uL (ref 150–450)
RBC: 3.28 x10E6/uL — ABNORMAL LOW (ref 3.77–5.28)
RDW: 13.6 % (ref 11.7–15.4)
RPR Ser Ql: NONREACTIVE
Rh Factor: POSITIVE
Rubella Antibodies, IGG: 3.15 index (ref >0.99)
WBC: 4.8 10*3/uL (ref 3.4–10.8)

## 2022-06-26 LAB — CERVICOVAGINAL ANCILLARY ONLY
Bacterial Vaginitis (gardnerella): POSITIVE — AB
Candida Glabrata: NEGATIVE
Candida Vaginitis: NEGATIVE
Comment: NEGATIVE
Comment: NEGATIVE
Comment: NEGATIVE

## 2022-06-26 LAB — HEMOGLOBIN A1C
Est. average glucose Bld gHb Est-mCnc: 105 mg/dL
Hgb A1c MFr Bld: 5.3 % (ref 4.8–5.6)

## 2022-06-26 LAB — HCV INTERPRETATION

## 2022-06-26 MED ORDER — METRONIDAZOLE 500 MG PO TABS: 500.0000 mg | ORAL_TABLET | Freq: Two times a day (BID) | ORAL | 0 refills | Status: AC

## 2022-06-26 NOTE — Therapy (Signed)
OUTPATIENT PHYSICAL THERAPY THORACOLUMBAR EVALUATION   Patient Name: Darlene Livingston MRN: [id number] DOB:04/14/1990, 32 y.o., female Today's Date: 06/26/2022  END OF SESSION:  PT End of Session - 06/26/22 1247     Visit Number 1    Date for PT Re-Evaluation 09/18/22    Authorization Type Medicaid  [employer]    PT Start Time 1247   late   PT Stop Time 1315    PT Time Calculation (min) 28 min    Activity Tolerance Patient tolerated treatment well             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   Headache(784.0)    Miscarriage    x2, one at 13weeks, one at [redacted] weeks gestation   Past Surgical History:  Procedure Laterality Date   THERAPEUTIC ABORTION  07/19/2020   Done at [facility], [redacted] weeks gestation   Patient Active Problem List   Diagnosis Date Noted   Sciatic leg pain 06/17/2022   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   Supervision of other normal pregnancy, antepartum 06/13/2022   Vaginal pain 06/04/2016   Uterine leiomyoma 01/28/2014   Genital herpes 10/11/2013     REFERRING PROVIDER: Jaynie Collins  REFERRING DIAG: M54.30 sciatic leg pain  Rationale for Evaluation and Treatment: Rehabilitation  THERAPY DIAG:  Sciatica leg pain during pregnancy  ONSET DATE: 03/2022  SUBJECTIVE:                                                                                                                                                                                           SUBJECTIVE STATEMENT: Left buttock to medial ankle pain, numbness, give way a few days a week;  used to have something similar around my cycle; during pregnancy worsened 2nd trimester; difficulty sleeping on left side.  Sitting leaning to the right.  It's my first time out of bed today.   Went to ED last week  PERTINENT HISTORY:  Prior to pregnancy went to apartment complex worked out--seemed  to help manage symptoms better [redacted] weeks pregnant due in August Had similar issue with 3rd trimester pregnancy with daughter then manageable History of hypermobility 32 year old, 32 year old  PAIN:  PAIN:  Are you having pain? Yes NPRS scale: 5.5/10 Pain location: buttock to medial knee Aggravating factors: a lot of walking, standing, sitting/driving (leaning to right) Relieving factors: lying down with pillows ; heat; yoga; massage gun Muscle relaxers just made me drowsy  PRECAUTIONS: Other: pregnancy  WEIGHT BEARING RESTRICTIONS: No  FALLS:  Has patient fallen in last 6 months? No  LIVING ENVIRONMENT: Lives with: lives with their family Lives in: House/apartment  OCCUPATION:leasing consultant (sitting)  PLOF: Independent with basic ADLs  PATIENT GOALS: lessen pain and leg give way     OBJECTIVE:    PATIENT SURVEYS:  Modified Oswestry 64%    COGNITION: Overall cognitive status: Within functional limits for tasks assessed       PALPATION: Marked tenderness in left gluteals   LUMBAR ROM:   AROM eval  Flexion Fingers 6 inches from floor  Extension 10 degrees  Right lateral flexion 10 degrees  Left lateral flexion 5 degrees  Right rotation   Left rotation    (Blank rows = not tested)  LE ROM: painful with attempted passive hip extension on left Painful with turning on table    LOWER EXTREMITY MMT:  Unable to actively hip abduct on left against gravity 3-/5 Able to heel and toe rise in standing  LUMBAR SPECIAL TESTS:  Pain with sciatic neural tensioning  FUNCTIONAL TESTS:  Requires heavy use of Ues to rise from standard height chair  GAIT: Comments: slow, antalgic  TODAY'S TREATMENT:                                                                                                                              DATE: 06/26/22    PATIENT EDUCATION:  Education details: Educated patient on anatomy and physiology of current symptoms, prognosis, plan of  care as well as initial self care strategies to promote recovery Person educated: Patient Education method: Explanation Education comprehension: verbalized understanding  HOME EXERCISE PROGRAM: Will initiate next visit;  discussed standing lumbar extension, short walks 2-3x/day  ASSESSMENT:  CLINICAL IMPRESSION: Patient is a 32 y.o. female who was seen today for physical therapy evaluation and treatment for left sciatica associated with pregnancy.  She has had several occurrences of left LE give-way.  Symptoms worsened with prolonged standing and walking and she is currently sitting leaning to the right.  She has had to take some vacation time from work b/c of the pain and had a visit to the ED last week.  Although she has had no back pain (pain starts from the buttock area) she is limited in all planes but most painful and limited with lumbar flexion and left sidebending.  Numerous tender points in left gluteals.  + neural sciatic tension.  She would benefit from PT to address these impairments.    OBJECTIVE IMPAIRMENTS: decreased activity tolerance, decreased mobility, difficulty walking, decreased ROM, decreased strength, increased fascial restrictions, impaired perceived functional ability, and pain.   ACTIVITY LIMITATIONS: lifting, bending, sitting, standing, sleeping, stairs, transfers, bed mobility, locomotion level, and caring for others  PARTICIPATION LIMITATIONS: meal prep, cleaning, laundry, driving, shopping, community activity, and occupation  PERSONAL FACTORS: Past/current experiences and 1 comorbidity: previous history of symptoms before and during pregnancy  are also affecting patient's functional outcome.   REHAB POTENTIAL:  Good  CLINICAL DECISION MAKING: Stable/uncomplicated  EVALUATION COMPLEXITY: Low   GOALS: Goals reviewed with patient? Yes  SHORT TERM GOALS: Target date: 08/07/2022    The patient will demonstrate knowledge of basic self care strategies and  exercises to promote healing  Baseline: Goal status: INITIAL  2.  The patient will be able to return to work with modifications as needed (change of position) Baseline:  Goal status: INITIAL  3.  The patient will be able to walk intermediate distances 10 min need for health benefits Baseline:  Goal status: INITIAL     LONG TERM GOALS: Target date: 09/18/2022    The patient will be independent in a safe self progression of a home exercise program to promote further recovery of function  Baseline:  Goal status: INITIAL  2.  The patient will be able to lift light to medium weights to prepare taking care of a newborn Baseline:  Goal status: INITIAL  3.  The patient will be able to stand/walk 15-20 minutes to complete household chores Baseline:  Goal status: INITIAL  4.  Modified Oswestry improved to 52%  Baseline:  Goal status: INITIAL   PLAN:  PT FREQUENCY: 1x/week  PT DURATION: 12 weeks  PLANNED INTERVENTIONS: Therapeutic exercises, Therapeutic activity, Neuromuscular re-education, Patient/Family education, Self Care, Joint mobilization, Aquatic Therapy, Dry Needling, Electrical stimulation, Spinal mobilization, Cryotherapy, Moist heat, Taping, Manual therapy, and Re-evaluation.  PLAN FOR NEXT SESSION: high symptom irritability; patient receptive to trying DN to gluteals, piriformis;  Addaday; try sideying neutral gapping; sidelying with peanut ball leg support  Lavinia Sharps, PT 06/26/22 8:16 PM Phone: [phone] Fax: [phone]

## 2022-06-27 LAB — URINE CULTURE, OB REFLEX: Organism ID, Bacteria: NO GROWTH

## 2022-06-27 LAB — CULTURE, OB URINE

## 2022-06-29 LAB — CYTOLOGY - PAP
Chlamydia: NEGATIVE
Comment: NEGATIVE
Comment: NEGATIVE
Comment: NEGATIVE
Comment: NEGATIVE
Comment: NORMAL
Diagnosis: NEGATIVE
HPV 16: NEGATIVE
HPV 18 / 45: NEGATIVE
High risk HPV: POSITIVE — AB
Neisseria Gonorrhea: NEGATIVE

## 2022-07-02 LAB — HORIZON CUSTOM: REPORT SUMMARY: NEGATIVE

## 2022-07-03 LAB — PANORAMA PRENATAL TEST FULL PANEL:PANORAMA TEST PLUS 5 ADDITIONAL MICRODELETIONS: FETAL FRACTION: 6.5

## 2022-07-04 NOTE — Progress Notes (Deleted)
PRENATAL VISIT NOTE  Subjective:  Darlene Livingston is a 32 y.o. [id number] at [redacted]w[redacted]d being seen today for ongoing prenatal care.  She is currently monitored for the following issues for this {Blank single:19197::"high-risk","low-risk"} pregnancy and has Vaginal pain; Uterine leiomyoma; Carrier of group B Streptococcus; Supervision of other normal pregnancy, antepartum; Sciatic leg pain; and Genital herpes on their problem list.  Patient reports {sx:14538}.   .  .   . Denies leaking of fluid.   The following portions of the patient's history were reviewed and updated as appropriate: allergies, current medications, past family history, past medical history, past social history, past surgical history and problem list.   Objective:  There were no vitals filed for this visit.  Fetal Status:           General:  Alert, oriented and cooperative. Patient is in no acute distress.  Skin: Skin is warm and dry. No rash noted.   Cardiovascular: Normal heart rate noted  Respiratory: Normal respiratory effort, no problems with respiration noted  Abdomen: Soft, gravid, appropriate for gestational age.        Pelvic: {Blank single:19197::"Cervical exam performed in the presence of a chaperone","Cervical exam deferred"}        Extremities: Normal range of motion.     Mental Status: Normal mood and affect. Normal behavior. Normal judgment and thought content.   Assessment and Plan:  Pregnancy: [id number] at [redacted]w[redacted]d 1. Supervision of other normal pregnancy, antepartum ***  2. Sciatic leg pain ***  3. Uterine leiomyoma, unspecified location ***  4. Genital herpes simplex, unspecified site ***  5. [redacted] weeks gestation of pregnancy ***  {Blank single:19197::"Term","Preterm"} labor symptoms and general obstetric precautions including but not limited to vaginal bleeding, contractions, leaking of fluid and fetal movement were reviewed in detail with the patient. Please refer to After Visit Summary for other  counseling recommendations.    Centering Pregnancy, Session#2: Reviewed rules for self-governance with group. Direct group to [employer].   Facilitated discussion today:  Nutrition, Back pain, Genetic screening options with AFP/Quad.   Mindfulness activity completed as well as deep breathing with 1 minutes of tension and 1 minute of relaxation for childbirth preparation.  Also participated in mindful eating activity Activity-demonstrated exercises to alleviate back pain.  Fundal height and FHR appropriate today unless noted otherwise in plan. Patient to continue group care.    No follow-ups on file.  Future Appointments  Date Time Provider Department Center  07/05/2022  8:00 AM Lavinia Sharps C, PT [facility] None  07/05/2022  9:00 AM CENTERING PROVIDER [facility]  07/10/2022 12:30 PM Lavinia Sharps C, PT [facility] None  07/17/2022  8:45 AM Benjie Karvonen April Ma L, PT [facility] None  07/17/2022  1:15 PM [facility] NURSE [facility]  07/17/2022  1:30 PM [facility] US3 [facility]  07/24/2022 11:45 AM Simpson, Stacy C, PT [facility] None  08/02/2022  9:00 AM CENTERING PROVIDER [facility]  08/03/2022 11:00 AM Nonato, Gellen April Ma L, PT [facility] None  08/15/2022 12:30 PM Lavinia Sharps C, PT [facility] None  08/16/2022  9:00 AM CENTERING PROVIDER [facility]  08/30/2022  9:00 AM CENTERING PROVIDER [facility]  08/30/2022 12:30 PM Lavinia Sharps C, PT [facility] None  09/13/2022  9:00 AM CENTERING PROVIDER [facility]  09/14/2022 11:00 AM Lavinia Sharps C, PT [facility] None  09/27/2022  9:00 AM CENTERING PROVIDER [facility]  10/11/2022  9:00 AM CENTERING PROVIDER [facility]  10/25/2022  9:00 AM CENTERING PROVIDER [facility]  11/08/2022  9:00 AM CENTERING PROVIDER [facility]    Dorathy Kinsman, CNM

## 2022-07-05 ENCOUNTER — Encounter: Payer: Medicaid Other | Admitting: Advanced Practice Midwife

## 2022-07-05 ENCOUNTER — Ambulatory Visit: Payer: Medicaid Other | Admitting: Physical Therapy

## 2022-07-05 DIAGNOSIS — D259 Leiomyoma of uterus, unspecified: Secondary | ICD-10-CM

## 2022-07-05 DIAGNOSIS — M5430 Sciatica, unspecified side: Secondary | ICD-10-CM

## 2022-07-05 DIAGNOSIS — M5432 Sciatica, left side: Secondary | ICD-10-CM

## 2022-07-05 DIAGNOSIS — M6281 Muscle weakness (generalized): Secondary | ICD-10-CM

## 2022-07-05 DIAGNOSIS — A6000 Herpesviral infection of urogenital system, unspecified: Secondary | ICD-10-CM

## 2022-07-05 DIAGNOSIS — Z3480 Encounter for supervision of other normal pregnancy, unspecified trimester: Secondary | ICD-10-CM

## 2022-07-05 DIAGNOSIS — Z3A20 20 weeks gestation of pregnancy: Secondary | ICD-10-CM

## 2022-07-05 NOTE — Therapy (Signed)
OUTPATIENT PHYSICAL THERAPY THORACOLUMBAR PROGRESS NOTE   Patient Name: Darlene Livingston MRN: [id number] DOB:1990-04-17, 32 y.o., female Today's Date: 07/05/2022  END OF SESSION:  PT End of Session - 07/05/22 0807     Visit Number 2    Date for PT Re-Evaluation 09/18/22    Authorization Type Medicaid  [employer] 27 visits    PT Start Time 0806    PT Stop Time 0843    PT Time Calculation (min) 37 min    Activity Tolerance Patient tolerated treatment well             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   Headache(784.0)    Miscarriage    x2, one at 13weeks, one at [redacted] weeks gestation   Past Surgical History:  Procedure Laterality Date   THERAPEUTIC ABORTION  07/19/2020   Done at [facility], [redacted] weeks gestation   Patient Active Problem List   Diagnosis Date Noted   Sciatic leg pain 06/17/2022   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   Supervision of other normal pregnancy, antepartum 06/13/2022   Vaginal pain 06/04/2016   Uterine leiomyoma 01/28/2014   Genital herpes 10/11/2013     REFERRING PROVIDER: Jaynie Collins  REFERRING DIAG: M54.30 sciatic leg pain  Rationale for Evaluation and Treatment: Rehabilitation  THERAPY DIAG:  Sciatica leg pain during pregnancy  ONSET DATE: 03/2022  SUBJECTIVE:                                                                                                                                                                                           SUBJECTIVE STATEMENT: Pain at night.  Had giveway yesterday walking at the hospital.  Driving to [place] yesterday aggravates.    Went to ED last week  PERTINENT HISTORY:  [place] toed on left Prior to pregnancy went to apartment complex worked out--seemed to help manage symptoms better [redacted] weeks pregnant due in August Had similar issue with 3rd trimester pregnancy with daughter  then manageable History of hypermobility 32 year old, 32 year old  PAIN:  PAIN:  Are you having pain? Yes NPRS scale: 6.5-7/10 Pain location:back and left buttock to medial knee Aggravating factors: a lot of walking, standing, sitting/driving (leaning to right) Relieving factors: lying down with pillows ; heat; yoga; massage gun Muscle relaxers just made me drowsy  PRECAUTIONS: Other: pregnancy  WEIGHT BEARING RESTRICTIONS: No  FALLS:  Has patient fallen in last 6 months? No  LIVING ENVIRONMENT: Lives with: lives with their family Lives in: House/apartment  OCCUPATION:leasing consultant (sitting)  PLOF: Independent with basic ADLs  PATIENT GOALS: lessen pain and leg give way     OBJECTIVE:    PATIENT SURVEYS:  Modified Oswestry 64%    COGNITION: Overall cognitive status: Within functional limits for tasks assessed       PALPATION: Marked tenderness in left gluteals   LUMBAR ROM:   AROM eval  Flexion Fingers 6 inches from floor  Extension 10 degrees  Right lateral flexion 10 degrees  Left lateral flexion 5 degrees  Right rotation   Left rotation    (Blank rows = not tested)  LE ROM: painful with attempted passive hip extension on left Painful with turning on table    LOWER EXTREMITY MMT:  Unable to actively hip abduct on left against gravity 3-/5 Able to heel and toe rise in standing  LUMBAR SPECIAL TESTS:  Pain with sciatic neural tensioning  FUNCTIONAL TESTS:  Requires heavy use of Ues to rise from standard height chair  GAIT: Comments: slow, antalgic  TODAY'S TREATMENT:                                                                                                                              DATE: 6/13: Sidelying with peanut good relief Patient education on Belly Band use  Manual therapy: soft tissue mobilization left gluteals, piriformis; sidelying neutral gapping Supine with wedge (comfortable) with gentle hip distraction  (irritable) Attempted hip abduction isometric in supine but too irritable at this time Trigger Point Dry-Needling  Treatment instructions: Expect mild to moderate muscle soreness. S/S of pneumothorax if dry needled over a lung field, and to seek immediate medical attention should they occur. Patient verbalized understanding of these instructions and education.  Patient Consent Given: Yes Education handout provided: Yes Muscles treated: left gluteals and piriformis Electrical stimulation performed: No Parameters: N/A Treatment response/outcome: improved pain and soft tissue mobility     PATIENT EDUCATION:  Education details: Educated patient on anatomy and physiology of current symptoms, prognosis, plan of care as well as initial self care strategies to promote recovery Person educated: Patient Education method: Explanation Education comprehension: verbalized understanding  HOME EXERCISE PROGRAM: Will initiate next visit;  discussed standing lumbar extension, short walks 2-3x/day  ASSESSMENT:  CLINICAL IMPRESSION: Good initial response to DN with patient stating, "I felt it release immediately."  She also responds well to positional unloading using peanut ball and supine with wedge prop and pillow behind knees.  Improved bed mobility and less gait antalgia noted post session.  Therapist monitoring response to all interventions and modifying treatment accordingly.    OBJECTIVE IMPAIRMENTS: decreased activity tolerance, decreased mobility, difficulty walking, decreased ROM, decreased strength, increased fascial restrictions, impaired perceived functional ability, and pain.   ACTIVITY LIMITATIONS: lifting, bending, sitting, standing, sleeping, stairs, transfers, bed mobility, locomotion level, and caring for others  PARTICIPATION LIMITATIONS: meal prep, cleaning, laundry, driving, shopping, community activity, and occupation  PERSONAL FACTORS: Past/current experiences and 1  comorbidity: previous history of symptoms before and during pregnancy  are also affecting patient's functional outcome.   REHAB POTENTIAL: Good  CLINICAL DECISION MAKING: Stable/uncomplicated  EVALUATION COMPLEXITY: Low   GOALS: Goals reviewed with patient? Yes  SHORT TERM GOALS: Target date: 08/07/2022    The patient will demonstrate knowledge of basic self care strategies and exercises to promote healing  Baseline: Goal status: INITIAL  2.  The patient will be able to return to work with modifications as needed (change of position) Baseline:  Goal status: INITIAL  3.  The patient will be able to walk intermediate distances 10 min need for health benefits Baseline:  Goal status: INITIAL     LONG TERM GOALS: Target date: 09/18/2022    The patient will be independent in a safe self progression of a home exercise program to promote further recovery of function  Baseline:  Goal status: INITIAL  2.  The patient will be able to lift light to medium weights to prepare taking care of a newborn Baseline:  Goal status: INITIAL  3.  The patient will be able to stand/walk 15-20 minutes to complete household chores Baseline:  Goal status: INITIAL  4.  Modified Oswestry improved to 52%  Baseline:  Goal status: INITIAL   PLAN:  PT FREQUENCY: 1x/week  PT DURATION: 12 weeks  PLANNED INTERVENTIONS: Therapeutic exercises, Therapeutic activity, Neuromuscular re-education, Patient/Family education, Self Care, Joint mobilization, Aquatic Therapy, Dry Needling, Electrical stimulation, Spinal mobilization, Cryotherapy, Moist heat, Taping, Manual therapy, and Re-evaluation.  PLAN FOR NEXT SESSION: high symptom irritability; assess response to DN #1 to gluteals, piriformis;  Addaday; sidelying with peanut ball leg support, education; try hip abduction isometric again  Lavinia Sharps, PT 07/05/22 5:53 PM Phone: [phone] Fax: [phone]

## 2022-07-05 NOTE — Patient Instructions (Signed)

## 2022-07-10 ENCOUNTER — Telehealth: Payer: Self-pay | Admitting: Physical Therapy

## 2022-07-10 ENCOUNTER — Ambulatory Visit: Payer: Medicaid Other | Admitting: Physical Therapy

## 2022-07-10 NOTE — Telephone Encounter (Signed)
Left voicemail for patient to call facility (regarding no-show for appt today and to remind of next appt on 6/25)

## 2022-07-17 ENCOUNTER — Ambulatory Visit: Payer: Medicaid Other | Admitting: Physical Therapy

## 2022-07-17 ENCOUNTER — Telehealth: Payer: Self-pay

## 2022-07-17 ENCOUNTER — Ambulatory Visit: Payer: Medicaid Other | Admitting: *Deleted

## 2022-07-17 ENCOUNTER — Other Ambulatory Visit: Payer: Self-pay | Admitting: *Deleted

## 2022-07-17 ENCOUNTER — Ambulatory Visit: Payer: Medicaid Other | Attending: Obstetrics and Gynecology

## 2022-07-17 ENCOUNTER — Other Ambulatory Visit: Payer: Self-pay | Admitting: Obstetrics and Gynecology

## 2022-07-17 VITALS — BP 108/60 | HR 74

## 2022-07-17 DIAGNOSIS — M6281 Muscle weakness (generalized): Secondary | ICD-10-CM

## 2022-07-17 DIAGNOSIS — Z363 Encounter for antenatal screening for malformations: Secondary | ICD-10-CM | POA: Diagnosis not present

## 2022-07-17 DIAGNOSIS — Z3480 Encounter for supervision of other normal pregnancy, unspecified trimester: Secondary | ICD-10-CM

## 2022-07-17 DIAGNOSIS — Z3A22 22 weeks gestation of pregnancy: Secondary | ICD-10-CM | POA: Insufficient documentation

## 2022-07-17 DIAGNOSIS — M5430 Sciatica, unspecified side: Secondary | ICD-10-CM | POA: Diagnosis present

## 2022-07-17 DIAGNOSIS — O3412 Maternal care for benign tumor of corpus uteri, second trimester: Secondary | ICD-10-CM | POA: Insufficient documentation

## 2022-07-17 DIAGNOSIS — O98512 Other viral diseases complicating pregnancy, second trimester: Secondary | ICD-10-CM | POA: Insufficient documentation

## 2022-07-17 DIAGNOSIS — O99212 Obesity complicating pregnancy, second trimester: Secondary | ICD-10-CM | POA: Insufficient documentation

## 2022-07-17 DIAGNOSIS — O321XX0 Maternal care for breech presentation, not applicable or unspecified: Secondary | ICD-10-CM | POA: Insufficient documentation

## 2022-07-17 DIAGNOSIS — M5432 Sciatica, left side: Secondary | ICD-10-CM | POA: Diagnosis not present

## 2022-07-17 DIAGNOSIS — D259 Leiomyoma of uterus, unspecified: Secondary | ICD-10-CM

## 2022-07-17 DIAGNOSIS — Z362 Encounter for other antenatal screening follow-up: Secondary | ICD-10-CM

## 2022-07-17 NOTE — Therapy (Signed)
OUTPATIENT PHYSICAL THERAPY THORACOLUMBAR PROGRESS NOTE   Patient Name: Darlene Livingston MRN: [id number] DOB:05/09/1990, 32 y.o., female Today's Date: 07/17/2022  END OF SESSION:  PT End of Session - 07/17/22 1016     Visit Number 3    Date for PT Re-Evaluation 09/18/22    Authorization Type Medicaid  [employer] 27 visits    PT Start Time 1016    PT Stop Time 1100    PT Time Calculation (min) 44 min    Activity Tolerance Patient tolerated treatment well              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   Headache(784.0)    Miscarriage    x2, one at 13weeks, one at [redacted] weeks gestation   Past Surgical History:  Procedure Laterality Date   THERAPEUTIC ABORTION  07/19/2020   Done at [facility], [redacted] weeks gestation   Patient Active Problem List   Diagnosis Date Noted   Sciatic leg pain 06/17/2022   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   Supervision of other normal pregnancy, antepartum 06/13/2022   Vaginal pain 06/04/2016   Uterine leiomyoma 01/28/2014   Genital herpes 10/11/2013     REFERRING PROVIDER: Jaynie Collins  REFERRING DIAG: M54.30 sciatic leg pain  Rationale for Evaluation and Treatment: Rehabilitation  THERAPY DIAG:  Sciatica leg pain during pregnancy  ONSET DATE: 03/2022  SUBJECTIVE:                                                                                                                                                                                           SUBJECTIVE STATEMENT: "I was amazing for 2-3 days after dry needling." Pt states she took a walk around her complex today. Pt notes a lot of difficulty going up/down stairs when she had to go to court yesterday. Pt reports she got a belly band but did not feel it did too much -- thinks she may be using it wrong.  Went to ED last week  PERTINENT HISTORY:  [place] toed on left Prior to  pregnancy went to apartment complex worked out--seemed to help manage symptoms better [redacted] weeks pregnant due in August Had similar issue with 3rd trimester pregnancy with daughter then manageable History of hypermobility 32 year old, 32 year old  PAIN:  PAIN:  Are you having pain? Yes NPRS scale: 6.5-7/10 Pain location:back and left buttock to medial knee Aggravating factors: a lot of walking, standing, sitting/driving (leaning to right) Relieving factors: lying down with pillows ; heat; yoga;  massage gun Muscle relaxers just made me drowsy  PRECAUTIONS: Other: pregnancy  WEIGHT BEARING RESTRICTIONS: No  FALLS:  Has patient fallen in last 6 months? No  LIVING ENVIRONMENT: Lives with: lives with their family Lives in: House/apartment  OCCUPATION:leasing consultant (sitting)  PLOF: Independent with basic ADLs  PATIENT GOALS: lessen pain and leg give way     OBJECTIVE:    PATIENT SURVEYS:  Modified Oswestry 64%    COGNITION: Overall cognitive status: Within functional limits for tasks assessed       PALPATION: Marked tenderness in left gluteals   LUMBAR ROM:   AROM eval  Flexion Fingers 6 inches from floor  Extension 10 degrees  Right lateral flexion 10 degrees  Left lateral flexion 5 degrees  Right rotation   Left rotation    (Blank rows = not tested)  LE ROM: painful with attempted passive hip extension on left Painful with turning on table    LOWER EXTREMITY MMT:  Unable to actively hip abduct on left against gravity 3-/5 Able to heel and toe rise in standing  LUMBAR SPECIAL TESTS:  Pain with sciatic neural tensioning  FUNCTIONAL TESTS:  Requires heavy use of Ues to rise from standard height chair  GAIT: Comments: slow, antalgic  TODAY'S TREATMENT:                                                                                                                              DATE:  6/25: Nustep L1 x 5 min for warm up and subjective Attempted  supine hamstring stretch with strap and figure 4 stretch but too irritable Supine ITB stretch with strap 2x10 sec (to pt tolerance) Supine LTR x10 PPT x10 PPT + diaphragmatic breathing x 10 Self care: Self massage and myofacial release with ball for posterior and anterior hip Trigger Point Dry-Needling  Treatment instructions: Expect mild to moderate muscle soreness. S/S of pneumothorax if dry needled over a lung field, and to seek immediate medical attention should they occur. Patient verbalized understanding of these instructions and education.  Patient Consent Given: Yes Education handout provided: Yes Muscles treated: left gluteals and piriformis, L TFL and adductor Electrical stimulation performed: No Parameters: N/A Treatment response/outcome: improved pain and soft tissue mobility   6/13: Sidelying with peanut good relief Patient education on Belly Band use  Manual therapy: soft tissue mobilization left gluteals, piriformis; sidelying neutral gapping Supine with wedge (comfortable) with gentle hip distraction (irritable) Attempted hip abduction isometric in supine but too irritable at this time Trigger Point Dry-Needling  Treatment instructions: Expect mild to moderate muscle soreness. S/S of pneumothorax if dry needled over a lung field, and to seek immediate medical attention should they occur. Patient verbalized understanding of these instructions and education.  Patient Consent Given: Yes Education handout provided: Yes Muscles treated: left gluteals and piriformis Electrical stimulation performed: No Parameters: N/A Treatment response/outcome: improved pain and soft tissue mobility     PATIENT EDUCATION:  Education  details: Educated patient on anatomy and physiology of current symptoms, prognosis, plan of care as well as initial self care strategies to promote recovery Person educated: Patient Education method: Explanation Education comprehension: verbalized  understanding  HOME EXERCISE PROGRAM: Access Code: [email or web address] URL: [email or web address] Date: 07/17/2022 Prepared by: Vernon Prey Darlene Kirstie Peri  Exercises - Supine Lower Trunk Rotation  - 1 x daily - 7 x weekly - 1 sets - 10 reps - Bent Knee Fallouts  - 1 x daily - 7 x weekly - 1 sets - 10 reps - Supine Posterior Pelvic Tilt  - 1 x daily - 7 x weekly - 1 sets - 10 reps - Supine ITB Stretch with Strap  - 1 x daily - 7 x weekly - 2 sets - 10 sec hold - Standing Glute Med Mobilization with Small Ball on Wall  - 1 x daily - 7 x weekly - 1 sets - 10 reps  discussed standing lumbar extension, short walks 2-3x/day  ASSESSMENT:  CLINICAL IMPRESSION: Pt had good response to initial session of TPDN. Continued to work on extensive posterior and anterior hip tightness. Pt with increased pain today due to walking multiple steps yesterday. Attempted to initiate gentle strengthening but pt has difficulty tolerating. Focused primarily on gentle pain free mobility instead for muscle relaxation. Able to perform diaphragmatic breaths with TSA contraction without increased pain. Pt will benefit from continued PT to work on improving hip/pelvic stability.   OBJECTIVE IMPAIRMENTS: decreased activity tolerance, decreased mobility, difficulty walking, decreased ROM, decreased strength, increased fascial restrictions, impaired perceived functional ability, and pain.   ACTIVITY LIMITATIONS: lifting, bending, sitting, standing, sleeping, stairs, transfers, bed mobility, locomotion level, and caring for others  PARTICIPATION LIMITATIONS: meal prep, cleaning, laundry, driving, shopping, community activity, and occupation  PERSONAL FACTORS: Past/current experiences and 1 comorbidity: previous history of symptoms before and during pregnancy  are also affecting patient's functional outcome.   REHAB POTENTIAL: Good  CLINICAL DECISION MAKING: Stable/uncomplicated  EVALUATION COMPLEXITY:  Low   GOALS: Goals reviewed with patient? Yes  SHORT TERM GOALS: Target date: 08/07/2022    The patient will demonstrate knowledge of basic self care strategies and exercises to promote healing  Baseline: Goal status: INITIAL  2.  The patient will be able to return to work with modifications as needed (change of position) Baseline:  Goal status: INITIAL  3.  The patient will be able to walk intermediate distances 10 min need for health benefits Baseline:  Goal status: INITIAL     LONG TERM GOALS: Target date: 09/18/2022    The patient will be independent in a safe self progression of a home exercise program to promote further recovery of function  Baseline:  Goal status: INITIAL  2.  The patient will be able to lift light to medium weights to prepare taking care of a newborn Baseline:  Goal status: INITIAL  3.  The patient will be able to stand/walk 15-20 minutes to complete household chores Baseline:  Goal status: INITIAL  4.  Modified Oswestry improved to 52%  Baseline:  Goal status: INITIAL   PLAN:  PT FREQUENCY: 1x/week  PT DURATION: 12 weeks  PLANNED INTERVENTIONS: Therapeutic exercises, Therapeutic activity, Neuromuscular re-education, Patient/Family education, Self Care, Joint mobilization, Aquatic Therapy, Dry Needling, Electrical stimulation, Spinal mobilization, Cryotherapy, Moist heat, Taping, Manual therapy, and Re-evaluation.  PLAN FOR NEXT SESSION: high symptom irritability; assess response to DN #1 to gluteals, piriformis;  Addaday; sidelying with peanut ball leg support, education; try hip abduction isometric again  [facility] Darlene Livingston, PT 07/17/22 10:16 AM Phone: [phone] Fax: [phone]

## 2022-07-17 NOTE — Therapy (Deleted)
OUTPATIENT PHYSICAL THERAPY THORACOLUMBAR PROGRESS NOTE   Patient Name: Darlene Livingston MRN: [id number] DOB:01-Sep-1990, 32 y.o., female Today's Date: 07/17/2022  END OF SESSION:    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   Headache(784.0)    Miscarriage    x2, one at 13weeks, one at [redacted] weeks gestation   Past Surgical History:  Procedure Laterality Date   THERAPEUTIC ABORTION  07/19/2020   Done at [facility], [redacted] weeks gestation   Patient Active Problem List   Diagnosis Date Noted   Sciatic leg pain 06/17/2022   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  Had giveway yesterday walking at the hospital.  Driving to [place] yesterday aggravates.    Went to ED last week  PERTINENT HISTORY:  [place] toed on left Prior to pregnancy went to apartment complex worked out--seemed to help manage symptoms better [redacted] weeks pregnant due in August Had similar issue with 3rd trimester pregnancy with daughter then manageable History of hypermobility 32 year old, 32 year old  PAIN:  PAIN:  Are you having pain? Yes NPRS scale: 6.5-7/10 Pain location:back and left buttock to medial knee Aggravating factors: a lot of walking, standing, sitting/driving (leaning to right) Relieving factors: lying down  with pillows ; heat; yoga; massage gun Muscle relaxers just made me drowsy  PRECAUTIONS: Other: pregnancy  WEIGHT BEARING RESTRICTIONS: No  FALLS:  Has patient fallen in last 6 months? No  LIVING ENVIRONMENT: Lives with: lives with their family Lives in: House/apartment  OCCUPATION:leasing consultant (sitting)  PLOF: Independent with basic ADLs  PATIENT GOALS: lessen pain and leg give way     OBJECTIVE:    PATIENT SURVEYS:  Modified Oswestry 64%    COGNITION: Overall cognitive status: Within functional limits for tasks assessed       PALPATION: Marked tenderness in left gluteals   LUMBAR ROM:   AROM eval  Flexion Fingers 6 inches from floor  Extension 10 degrees  Right lateral flexion 10 degrees  Left lateral flexion 5 degrees  Right rotation   Left rotation    (Blank rows = not tested)  LE ROM: painful with attempted passive hip extension on left Painful with turning on table    LOWER EXTREMITY MMT:  Unable to actively hip abduct on left against gravity 3-/5 Able to heel and toe rise in standing  LUMBAR SPECIAL TESTS:  Pain with sciatic neural tensioning  FUNCTIONAL TESTS:  Requires heavy use of Ues to rise from standard height chair  GAIT: Comments: slow, antalgic  TODAY'S TREATMENT:                                                                                                                              DATE:  6/25: ***  6/13: Sidelying with peanut good relief Patient education on Belly Band use  Manual therapy: soft tissue mobilization left gluteals, piriformis; sidelying neutral gapping Supine with wedge (comfortable) with gentle hip distraction (irritable) Attempted hip abduction isometric in supine but too irritable at this time Trigger Point Dry-Needling  Treatment instructions: Expect mild to moderate muscle soreness. S/S of pneumothorax if dry needled over a lung field, and to seek immediate medical attention should they occur.  Patient verbalized understanding of these instructions and education.  Patient Consent Given: Yes Education handout provided: Yes Muscles treated: left gluteals and piriformis Electrical stimulation performed: No Parameters: N/A Treatment response/outcome: improved pain and soft tissue mobility     PATIENT EDUCATION:  Education details: Educated patient on anatomy and physiology of current symptoms, prognosis, plan of care as well as initial self care strategies to promote recovery Person educated: Patient Education method: Explanation Education comprehension: verbalized understanding  HOME EXERCISE PROGRAM: Will initiate next visit;  discussed standing lumbar extension, short walks 2-3x/day  ASSESSMENT:  CLINICAL IMPRESSION: *** Good initial response to DN with patient stating, "I felt it release immediately."  She also responds well to positional unloading using peanut ball and supine with wedge prop and pillow behind knees.  Improved bed mobility and less gait antalgia noted post session.  Therapist monitoring response to all interventions and modifying treatment accordingly.    OBJECTIVE IMPAIRMENTS: decreased activity tolerance, decreased mobility, difficulty walking, decreased ROM, decreased strength, increased fascial restrictions, impaired perceived functional ability, and pain.   ACTIVITY LIMITATIONS: lifting, bending, sitting, standing, sleeping, stairs, transfers, bed mobility, locomotion level, and caring for others  PARTICIPATION LIMITATIONS: meal prep, cleaning, laundry, driving, shopping, community activity, and occupation  PERSONAL FACTORS: Past/current experiences and 1 comorbidity: previous history of symptoms before and during pregnancy  are also affecting patient's functional outcome.   REHAB POTENTIAL: Good  CLINICAL DECISION MAKING: Stable/uncomplicated  EVALUATION COMPLEXITY: Low   GOALS: Goals reviewed with patient? Yes  SHORT TERM GOALS: Target  date: 08/07/2022    The patient will demonstrate knowledge of basic self care strategies and exercises to promote healing  Baseline: Goal status: INITIAL  2.  The patient will be able to return to work with modifications as needed (change of position) Baseline:  Goal status: INITIAL  3.  The patient will be able to walk intermediate distances 10 min need for health benefits Baseline:  Goal status: INITIAL     LONG TERM GOALS: Target date: 09/18/2022    The patient will be independent in a safe self progression of a home exercise program to promote further recovery of function  Baseline:  Goal status: INITIAL  2.  The patient will be able to lift light to medium weights to prepare taking care of a newborn Baseline:  Goal status: INITIAL  3.  The patient will be able to stand/walk 15-20 minutes to complete household chores Baseline:  Goal status: INITIAL  4.  Modified Oswestry improved to 52%  Baseline:  Goal status: INITIAL   PLAN:  PT FREQUENCY: 1x/week  PT DURATION: 12 weeks  PLANNED INTERVENTIONS: Therapeutic exercises, Therapeutic activity, Neuromuscular re-education, Patient/Family education, Self Care, Joint mobilization, Aquatic Therapy, Dry Needling, Electrical stimulation, Spinal mobilization, Cryotherapy, Moist heat, Taping, Manual therapy, and Re-evaluation.  PLAN FOR NEXT SESSION: high symptom irritability; assess response to DN #1 to gluteals, piriformis;  Addaday; sidelying with peanut ball leg support, education; try hip abduction isometric again  [facility] Dell Ponto, PT 07/17/22 7:57 AM Phone: [phone] Fax: [phone]

## 2022-07-17 NOTE — Telephone Encounter (Signed)
Patient here today at front office requesting to be taken out of work due to leg pain. States she cannot keep calling out of work. Per chart review letter given 06/17/22 with work restrictions. Pt has been seeing PT regularly since that time. Front office explained we will review with provider and call patient.

## 2022-07-24 ENCOUNTER — Ambulatory Visit: Payer: Medicaid Other | Admitting: Physical Therapy

## 2022-07-30 ENCOUNTER — Ambulatory Visit: Payer: Medicaid Other | Attending: Obstetrics & Gynecology | Admitting: Physical Therapy

## 2022-07-30 DIAGNOSIS — M5432 Sciatica, left side: Secondary | ICD-10-CM | POA: Diagnosis present

## 2022-07-30 DIAGNOSIS — M6281 Muscle weakness (generalized): Secondary | ICD-10-CM | POA: Diagnosis present

## 2022-07-30 NOTE — Therapy (Signed)
OUTPATIENT PHYSICAL THERAPY THORACOLUMBAR PROGRESS NOTE   Patient Name: Darlene Livingston MRN: [id number] DOB:[birth year], 32 y.o., female Today's Date: 07/30/2022  END OF SESSION:  PT End of Session - 07/30/22 0858     Visit Number 4    Date for PT Re-Evaluation 09/18/22    Authorization Type Medicaid  [employer] 27 visits    PT Start Time [phone]   late arrival   PT Stop Time 0930    PT Time Calculation (min) 32 min    Activity Tolerance Patient tolerated treatment well               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   Headache(784.0)    Miscarriage    x2, one at 13weeks, one at [redacted] weeks gestation   Vaginal pain 06/04/2016   pt reports redundant tissue at introitus cause discomfort and requesting removal- wil sched in office mobile anesthesia of R post introitus   Past Surgical History:  Procedure Laterality Date   THERAPEUTIC ABORTION  07/19/2020   Done at [facility], [redacted] weeks gestation   Patient Active Problem List   Diagnosis Date Noted   Sciatic leg pain 06/17/2022   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   Supervision of other normal pregnancy, antepartum 06/13/2022   Uterine leiomyoma 01/28/2014   Genital herpes 10/11/2013     REFERRING PROVIDER: Jaynie Collins  REFERRING DIAG: M54.30 sciatic leg pain  Rationale for Evaluation and Treatment: Rehabilitation  THERAPY DIAG:  Sciatica leg pain during pregnancy  ONSET DATE: 03/2022  SUBJECTIVE:                                                                                                                                                                                           SUBJECTIVE STATEMENT: "Everything has been going well." Pt reports she was very sore after last session for 2-3 days. Has not had any issues but does report a fall in which her left LE buckled. Pt states things have been a lot  better. Pt states she is feeling it more on her R side today.  Went to ED last week  PERTINENT HISTORY:  P[place]on toed on left Prior to pregnancy went to apartment complex worked out--seemed to help manage symptoms better [redacted] weeks pregnant due in August Had similar issue with 3rd trimester pregnancy with daughter then manageable History of hypermobility 32 year old, 32 year old  PAIN:  PAIN:  Are you having pain? Yes NPRS scale: 6.5-7/10 Pain location:back and left buttock to medial  knee Aggravating factors: a lot of walking, standing, sitting/driving (leaning to right) Relieving factors: lying down with pillows ; heat; yoga; massage gun Muscle relaxers just made me drowsy  PRECAUTIONS: Other: pregnancy  WEIGHT BEARING RESTRICTIONS: No  FALLS:  Has patient fallen in last 6 months? No  LIVING ENVIRONMENT: Lives with: lives with their family Lives in: House/apartment  OCCUPATION:leasing consultant (sitting)  PLOF: Independent with basic ADLs  PATIENT GOALS: lessen pain and leg give way     OBJECTIVE: (Measures in this section from initial evaluation unless otherwise noted)  PATIENT SURVEYS:  Modified Oswestry 64%   PALPATION: Marked tenderness in left gluteals   LUMBAR ROM:   AROM eval  Flexion Fingers 6 inches from floor  Extension 10 degrees  Right lateral flexion 10 degrees  Left lateral flexion 5 degrees  Right rotation   Left rotation    (Blank rows = not tested)  LE ROM: painful with attempted passive hip extension on left Painful with turning on table    LOWER EXTREMITY MMT:  Unable to actively hip abduct on left against gravity 3-/5 Able to heel and toe rise in standing  LUMBAR SPECIAL TESTS:  Pain with sciatic neural tensioning  FUNCTIONAL TESTS:  Requires heavy use of Ues to rise from standard height chair  GAIT: Comments: slow, antalgic  TODAY'S TREATMENT:                                                                                                                               DATE:  7/8: Seated figure 4 stretch x 30 sec Seated hip flexor stretch x 30 sec Supine bent knee fall outs x10 Supine clamshell with diaphragmatic breaths red TB 2x10 Supine beginner bridge with red TB around knees x10 Supine LTR x10 Supine adductor ball squeeze with diaphragmatic breaths 2x10 PPT + with diaphragmatic breaths 2x10 Attempted heel slide but too painful for pt to attempt Seated LAQ red TB 2x10 Seated marching 2x10 Trigger Point Dry-Needling  Treatment instructions: Expect mild to moderate muscle soreness. S/S of pneumothorax if dry needled over a lung field, and to seek immediate medical attention should they occur. Patient verbalized understanding of these instructions and education. Patient Consent Given: Yes Education handout provided: Yes Muscles treated: R glute med/max Electrical stimulation performed: No Parameters: N/A Treatment response/outcome: improved pain and soft tissue mobility   6/25: Nustep L1 x 5 min for warm up and subjective Attempted supine hamstring stretch with strap and figure 4 stretch but too irritable Supine ITB stretch with strap 2x10 sec (to pt tolerance) Supine LTR x10 PPT x10 PPT + diaphragmatic breathing x 10 Self care: Self massage and myofacial release with ball for posterior and anterior hip Trigger Point Dry-Needling  Treatment instructions: Expect mild to moderate muscle soreness. S/S of pneumothorax if dry needled over a lung field, and to seek immediate medical attention should they occur. Patient verbalized understanding of these instructions and education.  Patient Consent  Given: Yes Education handout provided: Yes Muscles treated: left gluteals and piriformis, L TFL and adductor Electrical stimulation performed: No Parameters: N/A Treatment response/outcome: improved pain and soft tissue mobility   6/13: Sidelying with peanut good relief Patient education on Belly Band use   Manual therapy: soft tissue mobilization left gluteals, piriformis; sidelying neutral gapping Supine with wedge (comfortable) with gentle hip distraction (irritable) Attempted hip abduction isometric in supine but too irritable at this time Trigger Point Dry-Needling  Treatment instructions: Expect mild to moderate muscle soreness. S/S of pneumothorax if dry needled over a lung field, and to seek immediate medical attention should they occur. Patient verbalized understanding of these instructions and education.  Patient Consent Given: Yes Education handout provided: Yes Muscles treated: left gluteals and piriformis Electrical stimulation performed: No Parameters: N/A Treatment response/outcome: improved pain and soft tissue mobility     PATIENT EDUCATION:  Education details: Educated patient on anatomy and physiology of current symptoms, prognosis, plan of care as well as initial self care strategies to promote recovery Person educated: Patient Education method: Explanation Education comprehension: verbalized understanding  HOME EXERCISE PROGRAM: Access Code: [email or web address] URL: [email or web address] Date: 07/17/2022 Prepared by: Vernon Prey April Kirstie Peri  Exercises - Supine Lower Trunk Rotation  - 1 x daily - 7 x weekly - 1 sets - 10 reps - Bent Knee Fallouts  - 1 x daily - 7 x weekly - 1 sets - 10 reps - Supine Posterior Pelvic Tilt  - 1 x daily - 7 x weekly - 1 sets - 10 reps - Supine ITB Stretch with Strap  - 1 x daily - 7 x weekly - 2 sets - 10 sec hold - Standing Glute Med Mobilization with Small Ball on Wall  - 1 x daily - 7 x weekly - 1 sets - 10 reps  discussed standing lumbar extension, short walks 2-3x/day  ASSESSMENT:  CLINICAL IMPRESSION: Continued good response to TPDN. L hip is feeling better. R hip feeling tighter today so performed needling to R glute. Able to tolerate more gentle strengthening this session but still has mild discomfort. Continued  core strengthening as tolerated.    OBJECTIVE IMPAIRMENTS: decreased activity tolerance, decreased mobility, difficulty walking, decreased ROM, decreased strength, increased fascial restrictions, impaired perceived functional ability, and pain.   ACTIVITY LIMITATIONS: lifting, bending, sitting, standing, sleeping, stairs, transfers, bed mobility, locomotion level, and caring for others  PARTICIPATION LIMITATIONS: meal prep, cleaning, laundry, driving, shopping, community activity, and occupation  PERSONAL FACTORS: Past/current experiences and 1 comorbidity: previous history of symptoms before and during pregnancy  are also affecting patient's functional outcome.   REHAB POTENTIAL: Good  CLINICAL DECISION MAKING: Stable/uncomplicated  EVALUATION COMPLEXITY: Low   GOALS: Goals reviewed with patient? Yes  SHORT TERM GOALS: Target date: 08/07/2022    The patient will demonstrate knowledge of basic self care strategies and exercises to promote healing  Baseline: Goal status: INITIAL  2.  The patient will be able to return to work with modifications as needed (change of position) Baseline:  Goal status: INITIAL  3.  The patient will be able to walk intermediate distances 10 min need for health benefits Baseline:  Goal status: INITIAL     LONG TERM GOALS: Target date: 09/18/2022    The patient will be independent in a safe self progression of a home exercise program to promote further recovery of function  Baseline:  Goal status: INITIAL  2.  The patient will be able to lift light to medium weights to  prepare taking care of a newborn Baseline:  Goal status: INITIAL  3.  The patient will be able to stand/walk 15-20 minutes to complete household chores Baseline:  Goal status: INITIAL  4.  Modified Oswestry improved to 52%  Baseline:  Goal status: INITIAL   PLAN:  PT FREQUENCY: 1x/week  PT DURATION: 12 weeks  PLANNED INTERVENTIONS: Therapeutic exercises,  Therapeutic activity, Neuromuscular re-education, Patient/Family education, Self Care, Joint mobilization, Aquatic Therapy, Dry Needling, Electrical stimulation, Spinal mobilization, Cryotherapy, Moist heat, Taping, Manual therapy, and Re-evaluation.  PLAN FOR NEXT SESSION: high symptom irritability; assess response to DN;  Addaday; sidelying with peanut ball leg support, education; try hip abduction isometric again  [facility] Dell Ponto, PT 07/30/22 8:59 AM Phone: [phone] Fax: [phone]

## 2022-08-02 ENCOUNTER — Encounter: Payer: Self-pay | Admitting: Advanced Practice Midwife

## 2022-08-02 ENCOUNTER — Encounter: Payer: Self-pay | Admitting: General Practice

## 2022-08-03 ENCOUNTER — Encounter: Payer: Medicaid Other | Admitting: Physical Therapy

## 2022-08-15 ENCOUNTER — Encounter: Payer: Medicaid Other | Admitting: Physical Therapy

## 2022-08-16 ENCOUNTER — Ambulatory Visit: Payer: Medicaid Other | Admitting: Physical Therapy

## 2022-08-16 ENCOUNTER — Encounter: Payer: Self-pay | Admitting: *Deleted

## 2022-08-16 DIAGNOSIS — M5432 Sciatica, left side: Secondary | ICD-10-CM

## 2022-08-16 DIAGNOSIS — M6281 Muscle weakness (generalized): Secondary | ICD-10-CM

## 2022-08-16 NOTE — Therapy (Addendum)
 OUTPATIENT PHYSICAL THERAPY THORACOLUMBAR PROGRESS NOTE/DISCHARGE SUMMARY   Patient Name: Darlene Livingston MRN: [id number] DOB:11-27-1990, 32 y.o., female Today's Date: 08/16/2022  END OF SESSION:  PT End of Session - 08/16/22 1627     Visit Number 5    Date for PT Re-Evaluation 09/18/22    Authorization Type Medicaid  [employer] 27 visits    PT Start Time 1627    PT Stop Time 1700    PT Time Calculation (min) 33 min    Activity Tolerance Patient tolerated treatment well               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   Headache(784.0)    Miscarriage    x2, one at 13weeks, one at [redacted] weeks gestation   Vaginal pain 06/04/2016   pt reports redundant tissue at introitus cause discomfort and requesting removal- wil sched in office mobile anesthesia of R post introitus   Past Surgical History:  Procedure Laterality Date   THERAPEUTIC ABORTION  07/19/2020   Done at [facility], [redacted] weeks gestation   Patient Active Problem List   Diagnosis Date Noted   Sciatic leg pain 06/17/2022   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   Supervision of other normal pregnancy, antepartum 06/13/2022   Uterine leiomyoma 01/28/2014   Genital herpes 10/11/2013     REFERRING PROVIDER: Jaynie Collins  REFERRING DIAG: M54.30 sciatic leg pain  Rationale for Evaluation and Treatment: Rehabilitation  THERAPY DIAG:  Sciatica leg pain during pregnancy  ONSET DATE: 03/2022  SUBJECTIVE:                                                                                                                                                                                           SUBJECTIVE STATEMENT: It was excruciating after last session for a few days (from DN) but then it got better and I think it helped.  Going to pick up the kids in [place] next week.  I'm worried about the long car ride.   PERTINENT HISTORY:  [place] toed on left Prior to pregnancy went to apartment complex worked out--seemed to help manage symptoms better [redacted] weeks pregnant due in August Had similar issue with 3rd trimester pregnancy with daughter then manageable History of hypermobility 32 year old, 32 year old  PAIN:  PAIN:  Are you having pain? Yes NPRS scale: 4/10 Pain location:right buttock, left anterior/lateral thigh Aggravating factors: a lot of walking, standing, sitting/driving (leaning to right) Relieving factors: lying down with pillows ; heat; yoga; massage  gun Muscle relaxers just made me drowsy  PRECAUTIONS: Other: pregnancy  WEIGHT BEARING RESTRICTIONS: No  FALLS:  Has patient fallen in last 6 months? No  LIVING ENVIRONMENT: Lives with: lives with their family Lives in: House/apartment  OCCUPATION:leasing consultant (sitting)  PLOF: Independent with basic ADLs  PATIENT GOALS: lessen pain and leg give way     OBJECTIVE: (Measures in this section from initial evaluation unless otherwise noted)  PATIENT SURVEYS:  Modified Oswestry 64%   PALPATION: Marked tenderness in left gluteals   LUMBAR ROM:   AROM eval  Flexion Fingers 6 inches from floor  Extension 10 degrees  Right lateral flexion 10 degrees  Left lateral flexion 5 degrees  Right rotation   Left rotation    (Blank rows = not tested)  LE ROM: painful with attempted passive hip extension on left Painful with turning on table    LOWER EXTREMITY MMT:  Unable to actively hip abduct on left against gravity 3-/5 Able to heel and toe rise in standing  LUMBAR SPECIAL TESTS:  Pain with sciatic neural tensioning  FUNCTIONAL TESTS:  Requires heavy use of Ues to rise from standard height chair  GAIT: Comments: slow, antalgic  TODAY'S TREATMENT:                                                                                                                              DATE:  7/25: Discussion of left hip  "openers" to decompress superficial nerves: 1/2 kneel hip flexor stretch, foot in chair external rotation Recommend limiting prolonged sitting as this can irritate Use of Belly Band for walking Use of pillow between knees in sidelying Trigger Point Dry-Needling  Treatment instructions: Expect mild to moderate muscle soreness. S/S of pneumothorax if dry needled over a lung field, and to seek immediate medical attention should they occur. Patient verbalized understanding of these instructions and education. Patient Consent Given: Yes Education handout provided: Yes Muscles treated: R glute med/max; left lateral quads/hip flexors proximal Electrical stimulation performed: No Parameters: N/A Treatment response/outcome: improved pain and soft tissue mobility  KT tape to right glutes I strip; left hip flexors I strip  7/8: Seated figure 4 stretch x 30 sec Seated hip flexor stretch x 30 sec Supine bent knee fall outs x10 Supine clamshell with diaphragmatic breaths red TB 2x10 Supine beginner bridge with red TB around knees x10 Supine LTR x10 Supine adductor ball squeeze with diaphragmatic breaths 2x10 PPT + with diaphragmatic breaths 2x10 Attempted heel slide but too painful for pt to attempt Seated LAQ red TB 2x10 Seated marching 2x10 Trigger Point Dry-Needling  Treatment instructions: Expect mild to moderate muscle soreness. S/S of pneumothorax if dry needled over a lung field, and to seek immediate medical attention should they occur. Patient verbalized understanding of these instructions and education. Patient Consent Given: Yes Education handout provided: Yes Muscles treated: R glute med/max Electrical stimulation performed: No Parameters: N/A Treatment response/outcome: improved pain and soft tissue  mobility   7/8: Seated figure 4 stretch x 30 sec Seated hip flexor stretch x 30 sec Supine bent knee fall outs x10 Supine clamshell with diaphragmatic breaths red TB 2x10 Supine  beginner bridge with red TB around knees x10 Supine LTR x10 Supine adductor ball squeeze with diaphragmatic breaths 2x10 PPT + with diaphragmatic breaths 2x10 Attempted heel slide but too painful for pt to attempt Seated LAQ red TB 2x10 Seated marching 2x10 Trigger Point Dry-Needling  Treatment instructions: Expect mild to moderate muscle soreness. S/S of pneumothorax if dry needled over a lung field, and to seek immediate medical attention should they occur. Patient verbalized understanding of these instructions and education. Patient Consent Given: Yes Education handout provided: Yes Muscles treated: R glute med/max Electrical stimulation performed: No Parameters: N/A Treatment response/outcome: improved pain and soft tissue mobility    PATIENT EDUCATION:  Education details: Educated patient on anatomy and physiology of current symptoms, prognosis, plan of care as well as initial self care strategies to promote recovery Person educated: Patient Education method: Explanation Education comprehension: verbalized understanding  HOME EXERCISE PROGRAM: Access Code: [email or web address] URL: [email or web address] Date: 07/17/2022 Prepared by: Vernon Prey April Kirstie Peri  Exercises - Supine Lower Trunk Rotation  - 1 x daily - 7 x weekly - 1 sets - 10 reps - Bent Knee Fallouts  - 1 x daily - 7 x weekly - 1 sets - 10 reps - Supine Posterior Pelvic Tilt  - 1 x daily - 7 x weekly - 1 sets - 10 reps - Supine ITB Stretch with Strap  - 1 x daily - 7 x weekly - 2 sets - 10 sec hold - Standing Glute Med Mobilization with Small Ball on Wall  - 1 x daily - 7 x weekly - 1 sets - 10 reps  discussed standing lumbar extension, short walks 2-3x/day  ASSESSMENT:  CLINICAL IMPRESSION: Decreasing pain rating since start of care and more tolerant to movement.  Good response to DN although has post DN soreness.   Improved walking capacity using Belly Band.  Patient education to help manage symptoms as  she approaches her 3rd trimester.  Therapist monitoring response to all interventions and modifying treatment accordingly. STGs met.     OBJECTIVE IMPAIRMENTS: decreased activity tolerance, decreased mobility, difficulty walking, decreased ROM, decreased strength, increased fascial restrictions, impaired perceived functional ability, and pain.   ACTIVITY LIMITATIONS: lifting, bending, sitting, standing, sleeping, stairs, transfers, bed mobility, locomotion level, and caring for others  PARTICIPATION LIMITATIONS: meal prep, cleaning, laundry, driving, shopping, community activity, and occupation  PERSONAL FACTORS: Past/current experiences and 1 comorbidity: previous history of symptoms before and during pregnancy  are also affecting patient's functional outcome.   REHAB POTENTIAL: Good  CLINICAL DECISION MAKING: Stable/uncomplicated  EVALUATION COMPLEXITY: Low   GOALS: Goals reviewed with patient? Yes  SHORT TERM GOALS: Target date: 08/07/2022    The patient will demonstrate knowledge of basic self care strategies and exercises to promote healing  Baseline: Goal status: met 7/25  2.  The patient will be able to return to work with modifications as needed (change of position) Baseline:  Goal status: met 7/25 3.  The patient will be able to walk intermediate distances 10 min need for health benefits Baseline:  Goal status: met 7/25     LONG TERM GOALS: Target date: 09/18/2022    The patient will be independent in a safe self progression of a home exercise program to promote further recovery of function  Baseline:  Goal status:  INITIAL  2.  The patient will be able to lift light to medium weights to prepare taking care of a newborn Baseline:  Goal status: INITIAL  3.  The patient will be able to stand/walk 15-20 minutes to complete household chores Baseline:  Goal status: INITIAL  4.  Modified Oswestry improved to 52%  Baseline:  Goal status:  INITIAL   PLAN:  PT FREQUENCY: 1x/week  PT DURATION: 12 weeks  PLANNED INTERVENTIONS: Therapeutic exercises, Therapeutic activity, Neuromuscular re-education, Patient/Family education, Self Care, Joint mobilization, Aquatic Therapy, Dry Needling, Electrical stimulation, Spinal mobilization, Cryotherapy, Moist heat, Taping, Manual therapy, and Re-evaluation.  PLAN FOR NEXT SESSION:  DN;   assess response to KT; 1/2 kneel hip flexor stretch, foot in chair rotations; follow up after car ride to [facility], PT 08/16/22 5:15 PM Phone: [phone] Fax: [phone]  PHYSICAL THERAPY DISCHARGE SUMMARY  Visits from Start of Care: 5  Current functional level related to goals / functional outcomes: Did not return for follow up   Remaining deficits: As above   Education / Equipment: HEP    Patient goals were partially met. Patient is being discharged due to not returning since the last visit.  Lavinia Sharps, PT 04/30/23 7:35 AM Phone: [phone] Fax: [phone]

## 2022-08-22 ENCOUNTER — Other Ambulatory Visit: Payer: Self-pay

## 2022-08-22 ENCOUNTER — Ambulatory Visit (INDEPENDENT_AMBULATORY_CARE_PROVIDER_SITE_OTHER): Payer: Medicaid Other | Admitting: Obstetrics and Gynecology

## 2022-08-22 VITALS — BP 128/86 | HR 103 | Wt 210.5 lb

## 2022-08-22 DIAGNOSIS — Z3A27 27 weeks gestation of pregnancy: Secondary | ICD-10-CM

## 2022-08-22 DIAGNOSIS — O3662X0 Maternal care for excessive fetal growth, second trimester, not applicable or unspecified: Secondary | ICD-10-CM | POA: Insufficient documentation

## 2022-08-22 DIAGNOSIS — O3412 Maternal care for benign tumor of corpus uteri, second trimester: Secondary | ICD-10-CM

## 2022-08-22 DIAGNOSIS — Z23 Encounter for immunization: Secondary | ICD-10-CM

## 2022-08-22 DIAGNOSIS — D259 Leiomyoma of uterus, unspecified: Secondary | ICD-10-CM

## 2022-08-22 DIAGNOSIS — Z3480 Encounter for supervision of other normal pregnancy, unspecified trimester: Secondary | ICD-10-CM

## 2022-08-22 NOTE — Progress Notes (Signed)
    PRENATAL VISIT NOTE  Subjective:  Darlene Livingston is a 32 y.o. [phone] at [redacted]w[redacted]d being seen today for ongoing prenatal care.  She is currently monitored for the following issues for this low-risk pregnancy and has Uterine leiomyoma; Carrier of group B Streptococcus; Supervision of other normal pregnancy, antepartum; Sciatic leg pain; Genital herpes; and Excessive fetal growth affecting management of mother in second trimester, antepartum on their problem list.  Patient reports  felt flushed dizzy, n/v after having a smoothie from [employer].  .  Contractions: Not present. Vag. Bleeding: None.  Movement: Present. Denies leaking of fluid.   The following portions of the patient's history were reviewed and updated as appropriate: allergies, current medications, past family history, past medical history, past social history, past surgical history and problem list.   Objective:   Vitals:   08/22/22 1409  BP: 128/86  Pulse: (!) 103  Weight: 210 lb 8 oz (95.5 kg)    Fetal Status: Fetal Heart Rate (bpm): 154   Movement: Present     General:  Alert, oriented and cooperative. Patient is in no acute distress.  Skin: Skin is warm and dry. No rash noted.   Cardiovascular: Normal heart rate noted  Respiratory: Normal respiratory effort, no problems with respiration noted  Abdomen: Soft, gravid, appropriate for gestational age.  Pain/Pressure: Present     Pelvic: Cervical exam deferred        Extremities: Normal range of motion.     Mental Status: Normal mood and affect. Normal behavior. Normal judgment and thought content.   Assessment and Plan:  Pregnancy: [id number] at [redacted]w[redacted]d 1. Supervision of other normal pregnancy, antepartum 28wk labs/GTT next visit Tdap today  2. Excessive fetal growth affecting management of pregnancy in second trimester, single or unspecified fetus At anatomy u/s. F/u GTT. D/w her that may need to lay down due to glucose load and her prior s/s  3. [redacted] weeks gestation  of pregnancy S/s likely related to what she ate. Pt to let us know if becomes recurrent.   4. Uterine fibroids affecting pregnancy in second trimester 5x5cm at anatomy u/s. F/u at anatomy u/s  Preterm labor symptoms and general obstetric precautions including but not limited to vaginal bleeding, contractions, leaking of fluid and fetal movement were reviewed in detail with the patient. Please refer to After Visit Summary for other counseling recommendations.   Return in about 2 weeks (around 09/05/2022) for in person, fasting 2hr GTT, low risk ob, md or app.  Future Appointments  Date Time Provider Department Center  08/28/2022  3:45 PM [facility] US5 [facility]  08/30/2022  9:00 AM CENTERING PROVIDER [facility]  08/30/2022 12:30 PM Lavinia Sharps C, PT [facility] None  09/13/2022  9:00 AM CENTERING PROVIDER [facility]  09/14/2022 11:00 AM Lavinia Sharps C, PT [facility] None  09/27/2022  9:00 AM CENTERING PROVIDER [facility]  10/11/2022  9:00 AM CENTERING PROVIDER [facility]  10/25/2022  9:00 AM CENTERING PROVIDER [facility]  11/08/2022  9:00 AM CENTERING PROVIDER [facility]    Bynum Bing, MD

## 2022-08-28 ENCOUNTER — Ambulatory Visit: Payer: Medicaid Other | Attending: Maternal & Fetal Medicine

## 2022-08-28 DIAGNOSIS — D259 Leiomyoma of uterus, unspecified: Secondary | ICD-10-CM | POA: Diagnosis not present

## 2022-08-28 DIAGNOSIS — B009 Herpesviral infection, unspecified: Secondary | ICD-10-CM | POA: Diagnosis not present

## 2022-08-28 DIAGNOSIS — Z3A28 28 weeks gestation of pregnancy: Secondary | ICD-10-CM

## 2022-08-28 DIAGNOSIS — O98513 Other viral diseases complicating pregnancy, third trimester: Secondary | ICD-10-CM | POA: Diagnosis not present

## 2022-08-28 DIAGNOSIS — Z362 Encounter for other antenatal screening follow-up: Secondary | ICD-10-CM | POA: Insufficient documentation

## 2022-08-28 DIAGNOSIS — O3413 Maternal care for benign tumor of corpus uteri, third trimester: Secondary | ICD-10-CM | POA: Insufficient documentation

## 2022-08-28 DIAGNOSIS — E669 Obesity, unspecified: Secondary | ICD-10-CM

## 2022-08-28 DIAGNOSIS — O99213 Obesity complicating pregnancy, third trimester: Secondary | ICD-10-CM

## 2022-08-29 ENCOUNTER — Other Ambulatory Visit: Payer: Medicaid Other

## 2022-08-29 ENCOUNTER — Encounter: Payer: Self-pay | Admitting: *Deleted

## 2022-08-29 ENCOUNTER — Other Ambulatory Visit: Payer: Self-pay

## 2022-08-29 ENCOUNTER — Other Ambulatory Visit: Payer: Self-pay | Admitting: *Deleted

## 2022-08-29 DIAGNOSIS — D259 Leiomyoma of uterus, unspecified: Secondary | ICD-10-CM

## 2022-08-29 DIAGNOSIS — O99213 Obesity complicating pregnancy, third trimester: Secondary | ICD-10-CM

## 2022-08-29 DIAGNOSIS — Z3A27 27 weeks gestation of pregnancy: Secondary | ICD-10-CM

## 2022-08-29 DIAGNOSIS — Z3480 Encounter for supervision of other normal pregnancy, unspecified trimester: Secondary | ICD-10-CM

## 2022-08-30 ENCOUNTER — Ambulatory Visit: Payer: Medicaid Other | Admitting: Physical Therapy

## 2022-08-30 ENCOUNTER — Encounter: Payer: Self-pay | Admitting: Obstetrics and Gynecology

## 2022-08-30 ENCOUNTER — Encounter: Payer: Medicaid Other | Admitting: Advanced Practice Midwife

## 2022-08-30 DIAGNOSIS — O99013 Anemia complicating pregnancy, third trimester: Secondary | ICD-10-CM | POA: Insufficient documentation

## 2022-08-30 NOTE — Addendum Note (Signed)
Addended by: [place] Bing on: 08/30/2022 01:22 PM   Modules accepted: Orders

## 2022-08-31 ENCOUNTER — Telehealth: Payer: Self-pay

## 2022-08-31 ENCOUNTER — Other Ambulatory Visit: Payer: Self-pay

## 2022-08-31 NOTE — Telephone Encounter (Signed)
Auth Submission: NO AUTH NEEDED Site of care: Site of care: [facility] Payer: Medicaid - [employer] Medication & CPT/J Code(s) submitted: Venofer (Iron Sucrose) J1756  Auth type: Buy/Bill HB Units/visits requested: 300mg  at least 7 days apart x 3 doses  Approval from: 08/31/22 to 12/31/22

## 2022-09-12 ENCOUNTER — Encounter: Payer: Self-pay | Admitting: *Deleted

## 2022-09-13 ENCOUNTER — Encounter: Payer: Medicaid Other | Admitting: Advanced Practice Midwife

## 2022-09-14 ENCOUNTER — Ambulatory Visit: Payer: Medicaid Other | Attending: Obstetrics & Gynecology | Admitting: Physical Therapy

## 2022-09-14 ENCOUNTER — Telehealth: Payer: Self-pay | Admitting: Physical Therapy

## 2022-09-14 DIAGNOSIS — M5432 Sciatica, left side: Secondary | ICD-10-CM | POA: Insufficient documentation

## 2022-09-14 DIAGNOSIS — M6281 Muscle weakness (generalized): Secondary | ICD-10-CM | POA: Insufficient documentation

## 2022-09-14 NOTE — Telephone Encounter (Signed)
Called pt regarding no-show for appt.  She states she is in [place] right now but plans to call back next week to reschedule

## 2022-09-19 ENCOUNTER — Encounter (HOSPITAL_COMMUNITY): Payer: Self-pay | Admitting: Family Medicine

## 2022-09-19 ENCOUNTER — Inpatient Hospital Stay (HOSPITAL_COMMUNITY)
Admission: AD | Admit: 2022-09-19 | Discharge: 2022-09-20 | Disposition: A | Discharge status: Home / Self Care | Payer: Medicaid Other | Attending: Family Medicine | Admitting: Family Medicine

## 2022-09-19 DIAGNOSIS — Z3A31 31 weeks gestation of pregnancy: Secondary | ICD-10-CM | POA: Insufficient documentation

## 2022-09-19 DIAGNOSIS — O26893 Other specified pregnancy related conditions, third trimester: Secondary | ICD-10-CM | POA: Diagnosis not present

## 2022-09-19 DIAGNOSIS — G43909 Migraine, unspecified, not intractable, without status migrainosus: Secondary | ICD-10-CM | POA: Insufficient documentation

## 2022-09-19 DIAGNOSIS — O99353 Diseases of the nervous system complicating pregnancy, third trimester: Secondary | ICD-10-CM | POA: Insufficient documentation

## 2022-09-19 DIAGNOSIS — R42 Dizziness and giddiness: Secondary | ICD-10-CM | POA: Insufficient documentation

## 2022-09-19 DIAGNOSIS — R519 Headache, unspecified: Secondary | ICD-10-CM | POA: Diagnosis not present

## 2022-09-19 DIAGNOSIS — O26899 Other specified pregnancy related conditions, unspecified trimester: Secondary | ICD-10-CM

## 2022-09-19 MED ORDER — LACTATED RINGERS IV SOLN: Freq: Once | INTRAVENOUS | Status: AC

## 2022-09-19 NOTE — MAU Note (Signed)
..  Darlene Livingston is a 32 y.o. at [redacted]w[redacted]d here in MAU reporting: lightheadedness and dizziness that began on Tuesday when she was taking her kids to school and headache that began on Sunday.  Reports she took Tylenol on Tuesday but it did not help.  Reports +FM, denies vaginal bleeding or leaking of fluid.  Has irregular contractions.   Pain score: headache 6/10; contractions 4/10 Vitals:   09/19/22 2334  BP: 138/71  Pulse: 82  Resp: 17  Temp: 98.6 F (37 C)  SpO2: 98%     FHT: pt wearing dress, did not get FHR in triage Lab orders placed from triage:  UA

## 2022-09-19 NOTE — MAU Provider Note (Signed)
Chief Complaint:  No chief complaint on file.   Event Date/Time   First Provider Initiated Contact with Patient 09/19/22 2341     HPI: Darlene Livingston is a 32 y.o. [id number] at 103w2dwho presents to maternity admissions reporting headache since Sunday.  Took Tylenol 2 days ago that did not help. Developed lightheadedness yesterday  States has been able to eat and drink . She reports good fetal movement, denies LOF, vaginal bleeding, urinary symptoms, h/a, n/v, diarrhea, constipation or fever/chills.    Headache  This is a new problem. The current episode started in the past 7 days. The problem occurs constantly. The problem has been unchanged. The pain does not radiate. The quality of the pain is described as aching and dull. Associated symptoms include dizziness and phonophobia. Pertinent negatives include no abdominal pain, back pain, blurred vision, fever, muscle aches or sinus pressure. The symptoms are aggravated by bright light and activity. She has tried acetaminophen for the symptoms. The treatment provided no relief.   RN Note: Darlene Livingston is a 32 y.o. at [redacted]w[redacted]d here in MAU reporting: lightheadedness and dizziness that began on Tuesday when she was taking her kids to school and headache that began on Sunday.  Reports she took Tylenol on Tuesday but it did not help.  Reports +FM, denies vaginal bleeding or leaking of fluid.  Has irregular contractions.   Past Medical History: 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   Headache(784.0)    Miscarriage    x2, one at 13weeks, one at [redacted] weeks gestation   Vaginal pain 06/04/2016   pt reports redundant tissue at introitus cause discomfort and requesting removal- wil sched in office mobile anesthesia of R post introitus    Past obstetric history: OB History  Gravida Para Term Preterm AB Living  6 2 2  0 3 2  SAB IAB Ectopic Multiple Live Births  2  1 0 0 2    # Outcome Date GA Lbr Len/2nd Weight Sex Type Anes PTL Lv  6 Current           5 IAB 07/19/20 [redacted]w[redacted]d    TAB     4 Term 08/31/14 [redacted]w[redacted]d 11:06 / 00:16 2985 g F Vag-Spont EPI  LIV  3 Term 02/22/12 [redacted]w[redacted]d 13:06 / 00:18 2805 g F Vag-Spont EPI  LIV     Birth Comments: WNL  2 SAB           1 SAB             Past Surgical History: Past Surgical History:  Procedure Laterality Date   THERAPEUTIC ABORTION  07/19/2020   Done at [facility], [redacted] weeks gestation    Family History: Family History  Problem Relation Age of Onset   Hypertension Mother    Depression Maternal Aunt    Diabetes Maternal Aunt    Depression Maternal Uncle    Diabetes Maternal Uncle    Asthma Paternal Aunt    Cancer Paternal Aunt    Depression Paternal Aunt    Diabetes Paternal Aunt    Asthma Paternal Uncle    Depression Paternal Uncle    Diabetes Paternal Uncle    Birth defects Cousin    Anesthesia problems Neg Hx    Hypotension Neg Hx    Malignant hyperthermia Neg Hx    Pseudochol deficiency Neg Hx     Social History: Social History  Tobacco Use   Smoking status: Never   Smokeless tobacco: Never  Vaping Use   Vaping status: Never Used  Substance Use Topics   Alcohol use: No    Comment: social   Drug use: Not Currently    Comment: Delta 8 CBD    Allergies:  Allergies  Allergen Reactions   Fruit Blend Other (See Comments)    Sore throat from eating acidic fresh fruits   Other Other (See Comments)    Sore throat from eating acidic fresh fruits    Meds:  Medications Prior to Admission  Medication Sig Dispense Refill Last Dose   acetaminophen (TYLENOL) 500 MG tablet Take 2 tablets (1,000 mg total) by mouth every 6 (six) hours as needed for moderate pain or mild pain. 100 tablet 0    cyclobenzaprine (FLEXERIL) 10 MG tablet Take 1 tablet (10 mg total) by mouth 3 (three) times daily as needed for muscle spasms (leg pain). (Patient not taking: Reported on 07/17/2022) 30 tablet 3     famotidine (PEPCID) 20 MG tablet Take 1 tablet (20 mg total) by mouth 2 (two) times daily. 60 tablet 3    Prenatal 28-0.8 MG TABS Take 1 tablet by mouth daily. 30 tablet 12     I have reviewed patient's Past Medical Hx, Surgical Hx, Family Hx, Social Hx, medications and allergies.   ROS:  Review of Systems  Constitutional:  Negative for fever.  HENT:  Negative for sinus pressure.   Eyes:  Negative for blurred vision.  Gastrointestinal:  Negative for abdominal pain.  Musculoskeletal:  Negative for back pain.  Neurological:  Positive for dizziness and headaches.   Other systems negative  Physical Exam  Patient Vitals for the past 24 hrs:  BP Temp Temp src Pulse Resp SpO2 Height Weight  09/19/22 2334 138/71 98.6 F (37 C) Oral 82 17 98 % 5\' 6"  (1.676 m) 96.1 kg   Constitutional: Well-developed, well-nourished female in no acute distress.  Cardiovascular: normal rate and rhythm Respiratory: normal effort, clear to auscultation bilaterally GI: Abd soft, non-tender, gravid appropriate for gestational age.   No rebound or guarding. MS: Extremities nontender, no edema, normal ROM Neurologic: Alert and oriented x 4.  GU: Neg CVAT.  FHT:  Baseline 140 , moderate variability, accelerations present, no decelerations Contractions: occasional    Labs: Results for orders placed or performed during the hospital encounter of 09/19/22 (from the past 24 hour(s))  Urinalysis, Routine w reflex microscopic -Urine, Clean Catch     Status: Abnormal   Collection Time: 09/19/22 11:34 PM  Result Value Ref Range   Color, Urine YELLOW YELLOW   APPearance CLOUDY (A) CLEAR   Specific Gravity, Urine 1.020 1.005 - 1.030   pH 7.0 5.0 - 8.0   Glucose, UA 50 (A) NEGATIVE mg/dL   Hgb urine dipstick NEGATIVE NEGATIVE   Bilirubin Urine NEGATIVE NEGATIVE   Ketones, ur NEGATIVE NEGATIVE mg/dL   Protein, ur NEGATIVE NEGATIVE mg/dL   Nitrite NEGATIVE NEGATIVE   Leukocytes,Ua SMALL (A) NEGATIVE   RBC / HPF 0-5  0 - 5 RBC/hpf   WBC, UA 0-5 0 - 5 WBC/hpf   Bacteria, UA RARE (A) NONE SEEN   Squamous Epithelial / HPF 21-50 0 - 5 /HPF   Mucus PRESENT    Amorphous Crystal PRESENT   CBC     Status: Abnormal   Collection Time: 09/20/22 12:06 AM  Result Value Ref Range   WBC 5.0 4.0 - 10.5 K/uL   RBC 3.07 (  L) 3.87 - 5.11 MIL/uL   Hemoglobin 9.3 (L) 12.0 - 15.0 g/dL   HCT 29.5 (L) 62.1 - 30.8 %   MCV 91.9 80.0 - 100.0 fL   MCH 30.3 26.0 - 34.0 pg   MCHC 33.0 30.0 - 36.0 g/dL   RDW 65.7 84.6 - 96.2 %   Platelets 284 150 - 400 K/uL   nRBC 0.0 0.0 - 0.2 %  Comprehensive metabolic panel     Status: Abnormal   Collection Time: 09/20/22 12:06 AM  Result Value Ref Range   Sodium 137 135 - 145 mmol/L   Potassium 3.7 3.5 - 5.1 mmol/L   Chloride 106 98 - 111 mmol/L   CO2 22 22 - 32 mmol/L   Glucose, Bld 100 (H) 70 - 99 mg/dL   BUN 6 6 - 20 mg/dL   Creatinine, Ser 9.52 0.44 - 1.00 mg/dL   Calcium 8.9 8.9 - 84.1 mg/dL   Total Protein 6.1 (L) 6.5 - 8.1 g/dL   Albumin 2.8 (L) 3.5 - 5.0 g/dL   AST 18 15 - 41 U/L   ALT 14 0 - 44 U/L   Alkaline Phosphatase 61 38 - 126 U/L   Total Bilirubin 0.4 0.3 - 1.2 mg/dL   GFR, Estimated >32 >44 mL/min   Anion gap 9 5 - 15    A/Positive/-- (06/03 1547)  Imaging:    MAU Course/MDM: I have reviewed the triage vital signs and the nursing notes.   Pertinent labs & imaging results that were available during my care of the patient were reviewed by me and considered in my medical decision making (see chart for details).      I have reviewed her medical records including past results, notes and treatments.   I have ordered labs and reviewed results.  NST reviewed  Treatments in MAU included 2 liters of IV fluids for hydration.  We did Orthostatic VS which were normal  EKG was normal also. We gave Reglan, Benadryl and Excedrin Tension for headache with complete relief.  Lightheadedness resolved. .    Assessment: Single IUP at [redacted]w[redacted]d Migraine  headache Lightheadedness  Plan: Discharge home Supportive care PO hydration Follow up in Office for prenatal visits and recheck Encouraged to return if she develops worsening of symptoms, increase in pain, fever, or other concerning symptoms.   Pt stable at time of discharge.  Wynelle Bourgeois CNM, MSN Certified Nurse-Midwife 09/19/2022 11:41 PM

## 2022-09-20 ENCOUNTER — Ambulatory Visit: Payer: Medicaid Other

## 2022-09-20 DIAGNOSIS — R42 Dizziness and giddiness: Secondary | ICD-10-CM

## 2022-09-20 DIAGNOSIS — R519 Headache, unspecified: Secondary | ICD-10-CM

## 2022-09-20 DIAGNOSIS — Z3A31 31 weeks gestation of pregnancy: Secondary | ICD-10-CM

## 2022-09-20 DIAGNOSIS — O26893 Other specified pregnancy related conditions, third trimester: Secondary | ICD-10-CM

## 2022-09-20 LAB — COMPREHENSIVE METABOLIC PANEL
ALT: 14 U/L (ref 0–44)
AST: 18 U/L (ref 15–41)
Albumin: 2.8 g/dL — ABNORMAL LOW (ref 3.5–5.0)
Alkaline Phosphatase: 61 U/L (ref 38–126)
Anion gap: 9 (ref 5–15)
BUN: 6 mg/dL (ref 6–20)
CO2: 22 mmol/L (ref 22–32)
Calcium: 8.9 mg/dL (ref 8.9–10.3)
Chloride: 106 mmol/L (ref 98–111)
Creatinine, Ser: 0.48 mg/dL (ref 0.44–1.00)
GFR, Estimated: >60 mL/min (ref >60)
Glucose, Bld: 100 mg/dL — ABNORMAL HIGH (ref 70–99)
Potassium: 3.7 mmol/L (ref 3.5–5.1)
Sodium: 137 mmol/L (ref 135–145)
Total Bilirubin: 0.4 mg/dL (ref 0.3–1.2)
Total Protein: 6.1 g/dL — ABNORMAL LOW (ref 6.5–8.1)

## 2022-09-20 LAB — URINALYSIS, ROUTINE W REFLEX MICROSCOPIC
Bilirubin Urine: NEGATIVE
Glucose, UA: 50 mg/dL — AB
Hgb urine dipstick: NEGATIVE
Ketones, ur: NEGATIVE mg/dL
Nitrite: NEGATIVE
Protein, ur: NEGATIVE mg/dL
Specific Gravity, Urine: 1.02 (ref 1.005–1.030)
pH: 7 (ref 5.0–8.0)

## 2022-09-20 LAB — CBC
HCT: 28.2 % — ABNORMAL LOW (ref 36.0–46.0)
Hemoglobin: 9.3 g/dL — ABNORMAL LOW (ref 12.0–15.0)
MCH: 30.3 pg (ref 26.0–34.0)
MCHC: 33 g/dL (ref 30.0–36.0)
MCV: 91.9 fL (ref 80.0–100.0)
Platelets: 284 10*3/uL (ref 150–400)
RBC: 3.07 MIL/uL — ABNORMAL LOW (ref 3.87–5.11)
RDW: 14 % (ref 11.5–15.5)
WBC: 5 10*3/uL (ref 4.0–10.5)
nRBC: 0 % (ref 0.0–0.2)

## 2022-09-20 MED ORDER — ACETAMINOPHEN-CAFFEINE 500-65 MG PO TABS
2.0000 | ORAL_TABLET | Freq: Once | ORAL | Status: AC
  Administered 2022-09-20: 2 via ORAL
  Filled 2022-09-20: qty 2

## 2022-09-20 MED ORDER — LACTATED RINGERS IV SOLN: Freq: Once | INTRAVENOUS | Status: AC

## 2022-09-20 MED ORDER — DIPHENHYDRAMINE HCL 50 MG/ML IJ SOLN
25.0000 mg | Freq: Once | INTRAMUSCULAR | Status: AC
  Administered 2022-09-20: 25 mg via INTRAVENOUS
  Filled 2022-09-20: qty 1

## 2022-09-20 MED ORDER — METOCLOPRAMIDE HCL 5 MG/ML IJ SOLN
10.0000 mg | Freq: Once | INTRAMUSCULAR | Status: AC
  Administered 2022-09-20: 10 mg via INTRAVENOUS
  Filled 2022-09-20: qty 2

## 2022-09-25 ENCOUNTER — Ambulatory Visit (INDEPENDENT_AMBULATORY_CARE_PROVIDER_SITE_OTHER): Payer: Medicaid Other

## 2022-09-25 VITALS — BP 115/70 | HR 71 | Temp 98.2°F | Resp 16 | Ht 66.0 in | Wt 213.8 lb

## 2022-09-25 DIAGNOSIS — O99013 Anemia complicating pregnancy, third trimester: Secondary | ICD-10-CM

## 2022-09-25 DIAGNOSIS — Z3A32 32 weeks gestation of pregnancy: Secondary | ICD-10-CM | POA: Diagnosis not present

## 2022-09-25 DIAGNOSIS — D508 Other iron deficiency anemias: Secondary | ICD-10-CM | POA: Diagnosis not present

## 2022-09-25 MED ORDER — DIPHENHYDRAMINE HCL 25 MG PO CAPS
25.0000 mg | ORAL_CAPSULE | Freq: Once | ORAL | Status: AC
  Administered 2022-09-25: 25 mg via ORAL
  Filled 2022-09-25: qty 1

## 2022-09-25 MED ORDER — SODIUM CHLORIDE 0.9 % IV SOLN
300.0000 mg | Freq: Once | INTRAVENOUS | Status: AC
  Administered 2022-09-25: 300 mg via INTRAVENOUS
  Filled 2022-09-25: qty 15

## 2022-09-25 MED ORDER — ACETAMINOPHEN 325 MG PO TABS
650.0000 mg | ORAL_TABLET | Freq: Once | ORAL | Status: AC
  Administered 2022-09-25: 650 mg via ORAL
  Filled 2022-09-25: qty 2

## 2022-09-25 NOTE — Progress Notes (Signed)
Diagnosis: Iron Deficiency Anemia  Provider:  Chilton Greathouse MD  Procedure: IV Infusion  IV Type: Peripheral, IV Location: L Antecubital  Venofer (Iron Sucrose), Dose: 300 mg  Infusion Start Time: 0959  Infusion Stop Time: 1140  Post Infusion IV Care: Observation period completed. PIV removed  Discharge: Condition: Good, Destination: Home . AVS Declined  Performed by:  Rico Ala, LPN

## 2022-09-27 ENCOUNTER — Other Ambulatory Visit (HOSPITAL_COMMUNITY)
Admission: RE | Admit: 2022-09-27 | Discharge: 2022-09-27 | Disposition: A | Discharge status: Home / Self Care | Payer: Medicaid Other | Source: Ambulatory Visit | Attending: Advanced Practice Midwife | Admitting: Advanced Practice Midwife

## 2022-09-27 ENCOUNTER — Other Ambulatory Visit: Payer: Self-pay | Admitting: Advanced Practice Midwife

## 2022-09-27 ENCOUNTER — Ambulatory Visit: Payer: Medicaid Other

## 2022-09-27 ENCOUNTER — Encounter: Payer: Self-pay | Admitting: Advanced Practice Midwife

## 2022-09-27 VITALS — BP 98/75 | HR 106 | Wt 213.8 lb

## 2022-09-27 DIAGNOSIS — A6000 Herpesviral infection of urogenital system, unspecified: Secondary | ICD-10-CM

## 2022-09-27 DIAGNOSIS — Z3A32 32 weeks gestation of pregnancy: Secondary | ICD-10-CM

## 2022-09-27 DIAGNOSIS — O99013 Anemia complicating pregnancy, third trimester: Secondary | ICD-10-CM

## 2022-09-27 DIAGNOSIS — Z3480 Encounter for supervision of other normal pregnancy, unspecified trimester: Secondary | ICD-10-CM | POA: Insufficient documentation

## 2022-09-27 DIAGNOSIS — O2343 Unspecified infection of urinary tract in pregnancy, third trimester: Secondary | ICD-10-CM

## 2022-09-27 DIAGNOSIS — O26899 Other specified pregnancy related conditions, unspecified trimester: Secondary | ICD-10-CM

## 2022-09-27 DIAGNOSIS — R102 Pelvic and perineal pain: Secondary | ICD-10-CM

## 2022-09-27 DIAGNOSIS — D259 Leiomyoma of uterus, unspecified: Secondary | ICD-10-CM

## 2022-09-27 DIAGNOSIS — O26893 Other specified pregnancy related conditions, third trimester: Secondary | ICD-10-CM

## 2022-09-27 DIAGNOSIS — R350 Frequency of micturition: Secondary | ICD-10-CM

## 2022-09-27 DIAGNOSIS — O0933 Supervision of pregnancy with insufficient antenatal care, third trimester: Secondary | ICD-10-CM

## 2022-09-27 LAB — POCT URINALYSIS DIP (DEVICE)
Bilirubin Urine: NEGATIVE
Glucose, UA: NEGATIVE mg/dL
Hgb urine dipstick: NEGATIVE
Ketones, ur: NEGATIVE mg/dL
Nitrite: NEGATIVE
Protein, ur: NEGATIVE mg/dL
Specific Gravity, Urine: 1.02 (ref 1.005–1.030)
Urobilinogen, UA: 0.2 mg/dL (ref 0.0–1.0)
pH: 7 (ref 5.0–8.0)

## 2022-09-27 MED ORDER — NITROFURANTOIN MONOHYD MACRO 100 MG PO CAPS
100.0000 mg | ORAL_CAPSULE | Freq: Two times a day (BID) | ORAL | 0 refills | Status: DC
Start: 2022-09-27 — End: 2022-10-14

## 2022-09-27 NOTE — Patient Instructions (Signed)

## 2022-09-27 NOTE — Progress Notes (Signed)
   PRENATAL VISIT NOTE  Subjective:  Darlene Livingston is a 32 y.o. [id number] at [redacted]w[redacted]d being seen today for ongoing prenatal care.  She is currently monitored for the following issues for this low-risk pregnancy and has Uterine leiomyoma; Carrier of group B Streptococcus; Supervision of other normal pregnancy, antepartum; Sciatic leg pain; Genital herpes; Excessive fetal growth affecting management of mother in second trimester, antepartum; and Anemia in pregnancy, third trimester on their problem list.  Patient reports occasional contractions, pelvic pressure and frequency of urination.   Contractions: Irregular. Vag. Bleeding: None.  Movement: Present. Denies leaking of fluid.   The following portions of the patient's history were reviewed and updated as appropriate: allergies, current medications, past family history, past medical history, past social history, past surgical history and problem list.   Objective:   Vitals:   09/27/22 1321  BP: 98/75  Pulse: (!) 106  Weight: 213 lb 12.8 oz (97 kg)    Fetal Status: Fetal Heart Rate (bpm): 147 Fundal Height: 33 cm Movement: Present  Presentation: Complete Breech  General:  Alert, oriented and cooperative. Patient is in no acute distress.  Skin: Skin is warm and dry. No rash noted.   Cardiovascular: Normal heart rate noted  Respiratory: Normal respiratory effort, no problems with respiration noted  Abdomen: Soft, gravid, appropriate for gestational age.  Pain/Pressure: Present     Pelvic: Cervical exam performed in the presence of a chaperone Dilation: Closed Effacement (%): 0 Station: Ballotable  Extremities: Normal range of motion.     Mental Status: Normal mood and affect. Normal behavior. Normal judgment and thought content.   Assessment and Plan:  Pregnancy: [id number] at [redacted]w[redacted]d 1. Uterine leiomyoma, unspecified location - Intramural, not likely to impact delivery  2. Genital herpes simplex, unspecified site - Valtrex at 36  weeks  3. Supervision of other normal pregnancy, antepartum  - POCT urinalysis dip (device) - Cervicovaginal ancillary only  4. Anemia in pregnancy, third trimester - Scheduled for iron iron infusions. Encouraged to keep appts  5. Increased urinary frequency during pregnancy  - Culture, OB Urine  6. Pelvic pressure in pregnancy, antepartum, third trimester - Cervix closed closed and long. PTL precautions reviewed - Culture, OB Urine - Aptima  7. [redacted] weeks gestation of pregnancy   8. Limited prenatal care in third trimester - Plans to continue Centering.    Centering Pregnancy, Session#7: Reviewed resources in [employer].   Facilitated discussion today:   - Preparing for labor - Stages of labor - Mindfulness activity - Encouraged to choose pediatrician and support people/doula  Fundal height and FHR appropriate today unless noted otherwise in plan. Patient to continue group care.    Preterm labor symptoms and general obstetric precautions including but not limited to vaginal bleeding, contractions, leaking of fluid and fetal movement were reviewed in detail with the patient. Please refer to After Visit Summary for other counseling recommendations.   Return in about 2 weeks (around 10/11/2022) for Centering.  Future Appointments  Date Time Provider Department Center  10/02/2022  8:45 AM [facility] None  10/09/2022  9:30 AM [facility] US5 [facility]  10/11/2022  9:00 AM CENTERING PROVIDER [facility]  10/16/2022  9:00 AM [facility] None  10/25/2022  9:00 AM CENTERING PROVIDER [facility]  11/08/2022  9:00 AM CENTERING PROVIDER [facility]    Dorathy Kinsman, CNM

## 2022-09-29 LAB — CULTURE, OB URINE

## 2022-09-29 LAB — URINE CULTURE, OB REFLEX

## 2022-10-01 LAB — CERVICOVAGINAL ANCILLARY ONLY
Bacterial Vaginitis (gardnerella): NEGATIVE
Comment: NEGATIVE

## 2022-10-02 MED ORDER — DIPHENHYDRAMINE HCL 25 MG PO CAPS: 25.0000 mg | ORAL_CAPSULE | Freq: Once | ORAL | Status: DC

## 2022-10-02 MED ORDER — ACETAMINOPHEN 325 MG PO TABS: 650.0000 mg | ORAL_TABLET | Freq: Once | ORAL | Status: DC

## 2022-10-02 MED ORDER — SODIUM CHLORIDE 0.9 % IV SOLN
300.0000 mg | Freq: Once | INTRAVENOUS | Status: DC
  Filled 2022-10-02: qty 15

## 2022-10-04 ENCOUNTER — Ambulatory Visit (INDEPENDENT_AMBULATORY_CARE_PROVIDER_SITE_OTHER): Payer: Medicaid Other

## 2022-10-04 VITALS — BP 105/66 | HR 80 | Temp 97.8°F | Resp 18 | Ht 66.5 in | Wt 212.6 lb

## 2022-10-04 DIAGNOSIS — Z3A33 33 weeks gestation of pregnancy: Secondary | ICD-10-CM | POA: Diagnosis not present

## 2022-10-04 DIAGNOSIS — D508 Other iron deficiency anemias: Secondary | ICD-10-CM | POA: Diagnosis not present

## 2022-10-04 DIAGNOSIS — O99013 Anemia complicating pregnancy, third trimester: Secondary | ICD-10-CM | POA: Diagnosis not present

## 2022-10-04 MED ORDER — ACETAMINOPHEN 325 MG PO TABS
650.0000 mg | ORAL_TABLET | Freq: Once | ORAL | Status: AC
  Administered 2022-10-04: 650 mg via ORAL
  Filled 2022-10-04: qty 2

## 2022-10-04 MED ORDER — DIPHENHYDRAMINE HCL 25 MG PO CAPS
25.0000 mg | ORAL_CAPSULE | Freq: Once | ORAL | Status: AC
  Administered 2022-10-04: 25 mg via ORAL
  Filled 2022-10-04: qty 1

## 2022-10-04 MED ORDER — SODIUM CHLORIDE 0.9 % IV SOLN
300.0000 mg | Freq: Once | INTRAVENOUS | Status: AC
  Administered 2022-10-04: 300 mg via INTRAVENOUS
  Filled 2022-10-04: qty 15

## 2022-10-04 NOTE — Progress Notes (Signed)
Diagnosis: Iron Deficiency Anemia  Provider:  Chilton Greathouse MD  Procedure: IV Infusion  IV Type: Peripheral, IV Location: L Antecubital  Venofer (Iron Sucrose), Dose: 300 mg  Infusion Start Time: 0917  Infusion Stop Time: 1107  Post Infusion IV Care: Patient declined observation and Peripheral IV Discontinued  Discharge: Condition: Good, Destination: Home . AVS Declined  Performed by:  Rico Ala, LPN

## 2022-10-09 ENCOUNTER — Other Ambulatory Visit: Payer: Self-pay | Admitting: *Deleted

## 2022-10-09 ENCOUNTER — Other Ambulatory Visit: Payer: Self-pay | Admitting: Obstetrics and Gynecology

## 2022-10-09 ENCOUNTER — Ambulatory Visit: Payer: Medicaid Other | Attending: Obstetrics and Gynecology

## 2022-10-09 DIAGNOSIS — O3410 Maternal care for benign tumor of corpus uteri, unspecified trimester: Secondary | ICD-10-CM | POA: Insufficient documentation

## 2022-10-09 DIAGNOSIS — B009 Herpesviral infection, unspecified: Secondary | ICD-10-CM

## 2022-10-09 DIAGNOSIS — O99213 Obesity complicating pregnancy, third trimester: Secondary | ICD-10-CM | POA: Insufficient documentation

## 2022-10-09 DIAGNOSIS — Z3A34 34 weeks gestation of pregnancy: Secondary | ICD-10-CM

## 2022-10-09 DIAGNOSIS — O3413 Maternal care for benign tumor of corpus uteri, third trimester: Secondary | ICD-10-CM

## 2022-10-09 DIAGNOSIS — D259 Leiomyoma of uterus, unspecified: Secondary | ICD-10-CM

## 2022-10-09 DIAGNOSIS — O4103X0 Oligohydramnios, third trimester, not applicable or unspecified: Secondary | ICD-10-CM

## 2022-10-09 DIAGNOSIS — E669 Obesity, unspecified: Secondary | ICD-10-CM

## 2022-10-09 DIAGNOSIS — O98513 Other viral diseases complicating pregnancy, third trimester: Secondary | ICD-10-CM

## 2022-10-10 ENCOUNTER — Encounter: Payer: Self-pay | Admitting: Advanced Practice Midwife

## 2022-10-10 DIAGNOSIS — O4100X0 Oligohydramnios, unspecified trimester, not applicable or unspecified: Secondary | ICD-10-CM | POA: Insufficient documentation

## 2022-10-11 ENCOUNTER — Encounter (HOSPITAL_COMMUNITY): Payer: Self-pay | Admitting: Obstetrics & Gynecology

## 2022-10-11 ENCOUNTER — Encounter: Payer: Medicaid Other | Admitting: Advanced Practice Midwife

## 2022-10-11 ENCOUNTER — Inpatient Hospital Stay (HOSPITAL_COMMUNITY): Payer: Medicaid Other

## 2022-10-11 ENCOUNTER — Inpatient Hospital Stay (HOSPITAL_COMMUNITY)
Admission: AD | Admit: 2022-10-11 | Discharge: 2022-10-11 | Disposition: A | Discharge status: Home / Self Care | Payer: Medicaid Other | Attending: Obstetrics and Gynecology | Admitting: Obstetrics and Gynecology

## 2022-10-11 DIAGNOSIS — M545 Low back pain, unspecified: Secondary | ICD-10-CM | POA: Diagnosis present

## 2022-10-11 DIAGNOSIS — O4103X0 Oligohydramnios, third trimester, not applicable or unspecified: Secondary | ICD-10-CM | POA: Diagnosis not present

## 2022-10-11 DIAGNOSIS — J028 Acute pharyngitis due to other specified organisms: Secondary | ICD-10-CM | POA: Diagnosis not present

## 2022-10-11 DIAGNOSIS — O99513 Diseases of the respiratory system complicating pregnancy, third trimester: Secondary | ICD-10-CM | POA: Diagnosis not present

## 2022-10-11 DIAGNOSIS — R059 Cough, unspecified: Secondary | ICD-10-CM | POA: Diagnosis present

## 2022-10-11 DIAGNOSIS — R051 Acute cough: Secondary | ICD-10-CM

## 2022-10-11 DIAGNOSIS — B9789 Other viral agents as the cause of diseases classified elsewhere: Secondary | ICD-10-CM | POA: Diagnosis not present

## 2022-10-11 DIAGNOSIS — Z3A34 34 weeks gestation of pregnancy: Secondary | ICD-10-CM

## 2022-10-11 DIAGNOSIS — Z1152 Encounter for screening for COVID-19: Secondary | ICD-10-CM | POA: Diagnosis not present

## 2022-10-11 DIAGNOSIS — O4703 False labor before 37 completed weeks of gestation, third trimester: Secondary | ICD-10-CM | POA: Diagnosis present

## 2022-10-11 LAB — RESP PANEL BY RT-PCR (RSV, FLU A&B, COVID)  RVPGX2
Influenza A by PCR: NEGATIVE
Influenza B by PCR: NEGATIVE
Resp Syncytial Virus by PCR: NEGATIVE
SARS Coronavirus 2 by RT PCR: NEGATIVE

## 2022-10-11 LAB — URINALYSIS, ROUTINE W REFLEX MICROSCOPIC
Bilirubin Urine: NEGATIVE
Glucose, UA: NEGATIVE mg/dL
Hgb urine dipstick: NEGATIVE
Ketones, ur: 5 mg/dL — AB
Leukocytes,Ua: NEGATIVE
Nitrite: NEGATIVE
Protein, ur: NEGATIVE mg/dL
Specific Gravity, Urine: 1.01 (ref 1.005–1.030)
pH: 6 (ref 5.0–8.0)

## 2022-10-11 LAB — WET PREP, GENITAL
Clue Cells Wet Prep HPF POC: NONE SEEN
Sperm: NONE SEEN
Trich, Wet Prep: NONE SEEN
WBC, Wet Prep HPF POC: <10 (ref <10)
Yeast Wet Prep HPF POC: NONE SEEN

## 2022-10-11 LAB — RUPTURE OF MEMBRANE (ROM)PLUS: Rom Plus: NEGATIVE

## 2022-10-11 MED ORDER — BENZONATATE 100 MG PO CAPS
200.0000 mg | ORAL_CAPSULE | Freq: Once | ORAL | Status: AC
  Administered 2022-10-11: 200 mg via ORAL
  Filled 2022-10-11: qty 2

## 2022-10-11 MED ORDER — PHENOL 1.4 % MT LIQD
2.0000 | OROMUCOSAL | Status: DC | PRN
  Filled 2022-10-11: qty 177

## 2022-10-11 NOTE — MAU Note (Signed)
.  Darlene Livingston is a 32 y.o. at [redacted]w[redacted]d here in MAU reporting: Cough, chest congestion, CTX's, lower back pain and vaginal pressure. She reports she has been coughing up mucous since this past Saturday and she also had a sore throat that has now resolved. She reports she is unsure if she is leaking fluid or if she is urinating when she coughs. Reports her CTX's have been q10 minutes since 1100 this morning.Reports new onset lower back pain that began two hours ago. Reports oligo. Denies SOB and fever. Denies chest pain. Denies VB. +FM.   Onset of complaint: This past Saturday Pain scores:  6/10 vaginal pressure 6/10 lower back  FHT: 135 initial external Lab orders placed from triage: UA

## 2022-10-11 NOTE — MAU Provider Note (Signed)
History     [id number]  Arrival date and time: 10/11/22 1657    Chief Complaint  Patient presents with   Cough   Vaginal Pressure   Vaginal Discharge   Sore Throat     HPI Darlene Livingston is a 32 y.o. at [redacted]w[redacted]d who presents for cough, sore throat, vaginal discharge, & abdominal pressure. Symptoms started Saturday. Reports coughing up clear mucous. Sore throat. Was around her niece with similar symptoms. Denies fever/chills, body aches, SOB, CP. Has use throat lozenges & hot tea for her symptoms.  Leaks fluid when she coughs & unsure if she is urinating or leaking amniotic fluid. Denies discharge or leaking when she's not coughing. Reports pelvic pressure & contractions when she's walking that occurs about every 10 minutes.  Denies vaginal bleeding, dysuria, n/v/d. Reports good fetal movement.    OB History     Gravida  6   Para  2   Term  2   Preterm  0   AB  3   Living  2      SAB  2   IAB  1   Ectopic  0   Multiple  0   Live Births  2           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   Headache(784.0)    Miscarriage    x2, one at 13weeks, one at [redacted] weeks gestation   Vaginal pain 06/04/2016   pt reports redundant tissue at introitus cause discomfort and requesting removal- wil sched in office mobile anesthesia of R post introitus    Past Surgical History:  Procedure Laterality Date   THERAPEUTIC ABORTION  07/19/2020   Done at [facility], [redacted] weeks gestation    Family History  Problem Relation Age of Onset   Hypertension Mother    Depression Maternal Aunt    Diabetes Maternal Aunt    Depression Maternal Uncle    Diabetes Maternal Uncle    Asthma Paternal Aunt    Cancer Paternal Aunt    Depression Paternal Aunt    Diabetes Paternal Aunt    Asthma Paternal Uncle    Depression Paternal Uncle    Diabetes Paternal Uncle    Birth defects Cousin     Anesthesia problems Neg Hx    Hypotension Neg Hx    Malignant hyperthermia Neg Hx    Pseudochol deficiency Neg Hx     Allergies  Allergen Reactions   Fruit Blend Other (See Comments)    Sore throat from eating acidic fresh fruits   Other Other (See Comments)    Sore throat from eating acidic fresh fruits    Current Facility-Administered Medications on File Prior to Encounter  Medication Dose Route Frequency Provider Last Rate Last Admin   acetaminophen (TYLENOL) tablet 650 mg  650 mg Oral Once [place] Bing, MD       diphenhydrAMINE (BENADRYL) capsule 25 mg  25 mg Oral Once [place] Bing, MD       iron sucrose (VENOFER) 300 mg in sodium chloride 0.9 % 250 mL IVPB  300 mg Intravenous Once [place] Bing, MD       Current Outpatient Medications on File Prior to Encounter  Medication Sig Dispense Refill   acetaminophen (TYLENOL) 500 MG tablet Take 2 tablets (1,000 mg total) by mouth every 6 (six) hours as needed for moderate pain or mild pain. 100  tablet 0   famotidine (PEPCID) 20 MG tablet Take 1 tablet (20 mg total) by mouth 2 (two) times daily. 60 tablet 3   nitrofurantoin, macrocrystal-monohydrate, (MACROBID) 100 MG capsule Take 1 capsule (100 mg total) by mouth 2 (two) times daily. 14 capsule 0   Prenatal 28-0.8 MG TABS Take 1 tablet by mouth daily. 30 tablet 12     ROS Pertinent positives and negative per HPI, all others reviewed and negative  Physical Exam   BP 131/74 (BP Location: Left Arm)   Pulse 88   Temp 98.9 F (37.2 C) (Oral)   Resp 16   LMP 02/12/2022   SpO2 100%   Patient Vitals for the past 24 hrs:  BP Temp Temp src Pulse Resp SpO2  10/11/22 1728 131/74 98.9 F (37.2 C) Oral 88 16 100 %    Physical Exam Vitals and nursing note reviewed.  Constitutional:      General: She is not in acute distress.    Appearance: She is well-developed. She is ill-appearing. She is not toxic-appearing or diaphoretic.  HENT:     Head: Normocephalic and  atraumatic.  Eyes:     Conjunctiva/sclera: Conjunctivae normal.     Pupils: Pupils are equal, round, and reactive to light.  Cardiovascular:     Rate and Rhythm: Normal rate and regular rhythm.  Pulmonary:     Effort: Pulmonary effort is normal.     Breath sounds: Normal breath sounds.  Abdominal:     Palpations: Abdomen is soft.     Tenderness: There is no abdominal tenderness.     Comments: gravid  Skin:    General: Skin is warm and dry.  Neurological:     Mental Status: She is alert.      Cervical Exam Dilation: Closed Effacement (%): Thick Cervical Position: Posterior Exam by:: Judeth Horn NP   FHT Baseline 140, moderate variability, 15x15 accels, no decels Toco: flat Cat: 1  Labs Results for orders placed or performed during the hospital encounter of 10/11/22 (from the past 24 hour(s))  Wet prep, genital     Status: None   Collection Time: 10/11/22  6:17 PM   Specimen: Vaginal  Result Value Ref Range   Yeast Wet Prep HPF POC NONE SEEN NONE SEEN   Trich, Wet Prep NONE SEEN NONE SEEN   Clue Cells Wet Prep HPF POC NONE SEEN NONE SEEN   WBC, Wet Prep HPF POC <10 <10   Sperm NONE SEEN   Resp panel by RT-PCR (RSV, Flu A&B, Covid) Vaginal     Status: None   Collection Time: 10/11/22  6:17 PM   Specimen: Vaginal; Nasal Swab  Result Value Ref Range   SARS Coronavirus 2 by RT PCR NEGATIVE NEGATIVE   Influenza A by PCR NEGATIVE NEGATIVE   Influenza B by PCR NEGATIVE NEGATIVE   Resp Syncytial Virus by PCR NEGATIVE NEGATIVE  Urinalysis, Routine w reflex microscopic -Urine, Clean Catch     Status: Abnormal   Collection Time: 10/11/22  6:17 PM  Result Value Ref Range   Color, Urine YELLOW YELLOW   APPearance CLEAR CLEAR   Specific Gravity, Urine 1.010 1.005 - 1.030   pH 6.0 5.0 - 8.0   Glucose, UA NEGATIVE NEGATIVE mg/dL   Hgb urine dipstick NEGATIVE NEGATIVE   Bilirubin Urine NEGATIVE NEGATIVE   Ketones, ur 5 (A) NEGATIVE mg/dL   Protein, ur NEGATIVE NEGATIVE  mg/dL   Nitrite NEGATIVE NEGATIVE   Leukocytes,Ua NEGATIVE NEGATIVE  Rupture of  Membrane (ROM) Plus     Status: None   Collection Time: 10/11/22  6:17 PM  Result Value Ref Range   Rom Plus NEGATIVE     Imaging No results found.  MAU Course  Procedures Lab Orders         Wet prep, genital         Resp panel by RT-PCR (RSV, Flu A&B, Covid) Anterior Nasal Swab         Urinalysis, Routine w reflex microscopic -Urine, Clean Catch         Rupture of Membrane (ROM) Plus    Meds ordered this encounter  Medications   phenol (CHLORASEPTIC) mouth spray 2 spray   benzonatate (TESSALON) capsule 200 mg   Imaging Orders         [place] MFM OB LIMITED      MDM Cough, sore throat -Negative respiratory panel. Likely viral infection. She is afebrile, vital signs reassuring. Treated symptoms in MAU with chloraseptic spray & tessalon. She has OTC meds safe in pregnancy list. Discussed symptomatic treatment.   Oligohydramnios -Diagnosed in MFM earlier this week. She has close follow up. No evidence of PPROM today. Leaking during coughing episodes is likely urine - if leaking occurs at rest, instructed to return for evaluation.   Abdominal pain -Only occurs while walking. TOCO quiet & cervix closed. Reviewed PTL precautions.  Assessment and Plan   1. Sore throat (viral)   2. Acute cough   3. Oligohydramnios in third trimester, single or unspecified fetus   4. [redacted] weeks gestation of pregnancy    -Reviewed reasons to return to MAU -Discussed symptomatic treatment for throat pain & cough -Keep scheduled f/u   Judeth Horn, NP 10/11/22 9:05 PM

## 2022-10-14 ENCOUNTER — Ambulatory Visit (HOSPITAL_COMMUNITY)
Admission: EM | Admit: 2022-10-14 | Discharge: 2022-10-14 | Disposition: A | Discharge status: Home / Self Care | Payer: Medicaid Other | Attending: Physician Assistant | Admitting: Physician Assistant

## 2022-10-14 ENCOUNTER — Encounter (HOSPITAL_COMMUNITY): Payer: Self-pay

## 2022-10-14 DIAGNOSIS — J329 Chronic sinusitis, unspecified: Secondary | ICD-10-CM | POA: Diagnosis not present

## 2022-10-14 DIAGNOSIS — Z3A34 34 weeks gestation of pregnancy: Secondary | ICD-10-CM

## 2022-10-14 DIAGNOSIS — J029 Acute pharyngitis, unspecified: Secondary | ICD-10-CM | POA: Diagnosis not present

## 2022-10-14 DIAGNOSIS — O99513 Diseases of the respiratory system complicating pregnancy, third trimester: Secondary | ICD-10-CM

## 2022-10-14 DIAGNOSIS — J40 Bronchitis, not specified as acute or chronic: Secondary | ICD-10-CM

## 2022-10-14 MED ORDER — AMOXICILLIN 500 MG PO CAPS: 500.0000 mg | ORAL_CAPSULE | Freq: Three times a day (TID) | ORAL | 0 refills | Status: DC

## 2022-10-14 NOTE — ED Triage Notes (Signed)
Patient reports that she has had a productive cough and a sore throat x 8 days. Patient reports that she went to [facility] 2 days ago and ws prescribed a sore throat spray. Patient states her Covid test was negative.  Patient is [redacted] weeks pregnant.

## 2022-10-14 NOTE — Discharge Instructions (Signed)
We are treating you for sinus/bronchitis infection.  Please take amoxicillin as prescribed.  Continue over-the-counter medications that were recommended by your OB/GYN.  I also recommend gargling with warm salt water and using nasal saline/sinus rinses for additional symptom relief.  Make sure that you are resting and drinking plenty of fluid.  If your symptoms are not improving within a few days or if anything worsens you need to be seen immediately.

## 2022-10-14 NOTE — ED Provider Notes (Addendum)
[facility]    CSN: [id number] Arrival date & time: 10/14/22  1236      History   Chief Complaint Chief Complaint  Patient presents with   Sore Throat    HPI Darlene Livingston is a 32 y.o. female.   Patient presents today with a day history of URI symptoms.  She reports productive cough, sore throat that is causing painful swallowing, congestion, sinus pressure, postnasal drainage.  Denies any fever, chest pain, shortness of breath.  Denies any known sick contacts but does believe that her niece who she is often around might have been sick.  She was having such severe coughing spells that she was leaking some urine.  She does have oligohydramnios and so went to the MAU where testing was reassuring and she had a negative viral panel coding COVID, flu, RSV.  She has been using Chloraseptic spray as well as Mucinex and Robitussin without improvement of symptoms.  Denies any recent antibiotics or steroids.  Denies history of asthma or COPD.  She does not smoke.  She is currently [redacted] weeks pregnant.    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   Headache(784.0)    Miscarriage    x2, one at 13weeks, one at [redacted] weeks gestation   Vaginal pain 06/04/2016   pt reports redundant tissue at introitus cause discomfort and requesting removal- wil sched in office mobile anesthesia of R post introitus    Patient Active Problem List   Diagnosis Date Noted   Oligohydramnios antepartum 10/10/2022   Anemia in pregnancy, third trimester 08/30/2022   Excessive fetal growth affecting management of mother in second trimester, antepartum 08/22/2022   Sciatic leg pain 06/17/2022   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 Authorizing Provider  amoxicillin (AMOXIL) 500 MG capsule Take 1 capsule (500 mg total) by mouth 3 (three) times daily. 10/14/22  Yes Emmanual Gauthreaux, Noberto Retort, PA-C  acetaminophen (TYLENOL) 500 MG tablet Take 2 tablets (1,000 mg total) by mouth every 6 (six) hours as needed for moderate pain or mild pain. 06/17/22   Anyanwu, Jethro Bastos, MD  Prenatal 28-0.8 MG TABS Take 1 tablet by mouth daily. 03/21/22   Venora Maples, MD    Family History Family History  Problem Relation Age of Onset   Hypertension Mother    Depression Maternal Aunt    Diabetes Maternal Aunt    Depression Maternal Uncle    Diabetes Maternal Uncle    Asthma Paternal Aunt    Cancer Paternal Aunt    Depression Paternal Aunt    Diabetes Paternal Aunt  Asthma Paternal Uncle    Depression Paternal Uncle    Diabetes Paternal Uncle    Birth defects Cousin    Anesthesia problems Neg Hx    Hypotension Neg Hx    Malignant hyperthermia Neg Hx    Pseudochol deficiency Neg Hx     Social History Social History   Tobacco Use   Smoking status: Never   Smokeless tobacco: Never  Vaping Use   Vaping status: Never Used  Substance Use Topics   Alcohol use: No    Comment: social   Drug use: Not Currently    Comment: Delta 8 CBD     Allergies   Fruit blend and Other   Review of Systems Review of Systems  Constitutional:  Positive for activity change. Negative for appetite change, fatigue and fever.  HENT:  Positive for congestion, postnasal drip and sore throat. Negative for sinus pressure and sneezing.   Respiratory:  Positive for cough. Negative for shortness of  breath.   Cardiovascular:  Negative for chest pain.  Gastrointestinal:  Negative for abdominal pain, diarrhea, nausea and vomiting.     Physical Exam Triage Vital Signs ED Triage Vitals  Encounter Vitals Group     BP 10/14/22 1415 126/80     Systolic BP Percentile --      Diastolic BP Percentile --      Pulse Rate 10/14/22 1415 99     Resp 10/14/22 1415 16     Temp 10/14/22 1415 98.2 F (36.8 C)     Temp Source 10/14/22 1415 Oral     SpO2 10/14/22 1415 94 %     Weight --      Height --      Head Circumference --      Peak Flow --      Pain Score 10/14/22 1417 8     Pain Loc --      Pain Education --      Exclude from Growth Chart --    No data found.  Updated Vital Signs BP 126/80 (BP Location: Left Arm)   Pulse 99   Temp 98.2 F (36.8 C) (Oral)   Resp 16   LMP 02/12/2022   SpO2 94%   Visual Acuity Right Eye Distance:   Left Eye Distance:   Bilateral Distance:    Right Eye Near:   Left Eye Near:    Bilateral Near:     Physical Exam Vitals reviewed.  Constitutional:      General: She is awake. She is not in acute distress.    Appearance: Normal appearance. She is well-developed. She is not ill-appearing.     Comments: Very pleasant female appears stated age in no acute distress sitting comfortably in exam room  HENT:     Head: Normocephalic and atraumatic.     Right Ear: Tympanic membrane, ear canal and external ear normal. Tympanic membrane is not erythematous or bulging.     Left Ear: Tympanic membrane, ear canal and external ear normal. Tympanic membrane is not erythematous or bulging.     Nose:     Right Sinus: Maxillary sinus tenderness present. No frontal sinus tenderness.     Left Sinus: Maxillary sinus tenderness present. No frontal sinus tenderness.     Mouth/Throat:     Pharynx: Uvula midline. Postnasal drip present. No oropharyngeal exudate or posterior oropharyngeal erythema.     Tonsils: No tonsillar exudate or tonsillar abscesses.   Cardiovascular:     Rate and Rhythm: Normal rate  and regular rhythm.     Heart sounds: Normal heart sounds, S1 normal and S2 normal. No murmur heard. Pulmonary:     Effort: Pulmonary effort is normal.     Breath sounds: Normal breath sounds. No wheezing, rhonchi or rales.     Comments: Reactive cough with deep breathing.  No adventitious lung sounds on exam. Psychiatric:        Behavior: Behavior is cooperative.      UC Treatments / Results  Labs (all labs ordered are listed, but only abnormal results are displayed) Labs Reviewed - No data to display  EKG   Radiology No results found.  Procedures Procedures (including critical care time)  Medications Ordered in UC Medications - No data to display  Initial Impression / Assessment and Plan / UC Course  I have reviewed the triage vital signs and the nursing notes.  Pertinent labs & imaging results that were available during my care of the patient were reviewed by me and considered in my medical decision making (see chart for details).     Patient is well-appearing, afebrile, nontoxic, nontachycardic.  Viral testing was deferred as she has already had negative viral testing and she has been symptomatic for over a week so this would not change our management.  Given prolonged and worsening symptoms will cover for secondary bacterial infection.  Augmentin 500 mg 3 times daily was sent to pharmacy for 1 week.  She can continue over-the-counter medications.  She did have a reactive cough but no wheezing on exam.  Will defer additional treatment including albuterol inhaler for the time being but consider this if symptoms persist.  Low suspicion for pneumonia as she had no adventitious lung sounds on exam so x-ray was deferred as risk outweighs benefit.  We discussed that if her symptoms are not improving she needs to return for reevaluation.  If she has any worsening symptoms she needs to be seen immediately.  Strict return precautions  given.  All questions answered to patient satisfaction.  Final Clinical Impressions(s) / UC Diagnoses   Final diagnoses:  Sinobronchitis  Pharyngitis, unspecified etiology  Respiratory system disease complicating pregnancy, third trimester  [redacted] weeks gestation of pregnancy     Discharge Instructions      We are treating you for sinus/bronchitis infection.  Please take amoxicillin as prescribed.  Continue over-the-counter medications that were recommended by your OB/GYN.  I also recommend gargling with warm salt water and using nasal saline/sinus rinses for additional symptom relief.  Make sure that you are resting and drinking plenty of fluid.  If your symptoms are not improving within a few days or if anything worsens you need to be seen immediately.     ED Prescriptions     Medication Sig Dispense Auth. Provider   amoxicillin (AMOXIL) 500 MG capsule Take 1 capsule (500 mg total) by mouth 3 (three) times daily. 21 capsule Ginna Schuur K, PA-C      PDMP not reviewed this encounter.   Jeani Hawking, PA-C 10/14/22 1452    RaspetNoberto Retort, PA-C 10/14/22 1454

## 2022-10-16 ENCOUNTER — Telehealth: Payer: Self-pay

## 2022-10-16 ENCOUNTER — Ambulatory Visit: Payer: Medicaid Other

## 2022-10-16 MED ORDER — ACETAMINOPHEN 325 MG PO TABS: 650.0000 mg | ORAL_TABLET | Freq: Once | ORAL | Status: DC

## 2022-10-16 MED ORDER — SODIUM CHLORIDE 0.9 % IV SOLN
300.0000 mg | Freq: Once | INTRAVENOUS | Status: DC
  Filled 2022-10-16: qty 15

## 2022-10-16 MED ORDER — DIPHENHYDRAMINE HCL 25 MG PO CAPS: 25.0000 mg | ORAL_CAPSULE | Freq: Once | ORAL | Status: DC

## 2022-10-16 NOTE — Telephone Encounter (Signed)
Auth Submission: NO AUTH NEEDED Site of care: Site of care: [facility] Payer: [place] MEDICAID PREPAID HEALTH PLAN  Medication & CPT/J Code(s) submitted: Venofer (Iron Sucrose) J1756 Route of submission (phone, fax, portal): phone Phone # Fax # Auth type: Buy/Bill HB Units/visits requested: 300mg , 1 dose Reference number: [id number]  Approval from: 10/16/22 to 01/22/23

## 2022-10-17 ENCOUNTER — Ambulatory Visit: Payer: Medicaid Other | Admitting: *Deleted

## 2022-10-17 ENCOUNTER — Ambulatory Visit: Payer: Medicaid Other | Attending: Obstetrics

## 2022-10-17 ENCOUNTER — Other Ambulatory Visit: Payer: Self-pay | Admitting: Obstetrics

## 2022-10-17 ENCOUNTER — Other Ambulatory Visit: Payer: Self-pay | Admitting: *Deleted

## 2022-10-17 VITALS — BP 118/76 | HR 79

## 2022-10-17 DIAGNOSIS — O42913 Preterm premature rupture of membranes, unspecified as to length of time between rupture and onset of labor, third trimester: Secondary | ICD-10-CM | POA: Diagnosis not present

## 2022-10-17 DIAGNOSIS — O4100X0 Oligohydramnios, unspecified trimester, not applicable or unspecified: Secondary | ICD-10-CM | POA: Insufficient documentation

## 2022-10-17 DIAGNOSIS — D259 Leiomyoma of uterus, unspecified: Secondary | ICD-10-CM | POA: Insufficient documentation

## 2022-10-17 DIAGNOSIS — O4103X0 Oligohydramnios, third trimester, not applicable or unspecified: Secondary | ICD-10-CM | POA: Insufficient documentation

## 2022-10-17 DIAGNOSIS — Z3A35 35 weeks gestation of pregnancy: Secondary | ICD-10-CM

## 2022-10-17 DIAGNOSIS — B009 Herpesviral infection, unspecified: Secondary | ICD-10-CM

## 2022-10-17 DIAGNOSIS — O99213 Obesity complicating pregnancy, third trimester: Secondary | ICD-10-CM | POA: Diagnosis not present

## 2022-10-17 DIAGNOSIS — M5430 Sciatica, unspecified side: Secondary | ICD-10-CM | POA: Insufficient documentation

## 2022-10-17 DIAGNOSIS — A6000 Herpesviral infection of urogenital system, unspecified: Secondary | ICD-10-CM | POA: Diagnosis not present

## 2022-10-17 DIAGNOSIS — O98513 Other viral diseases complicating pregnancy, third trimester: Secondary | ICD-10-CM

## 2022-10-17 DIAGNOSIS — O3413 Maternal care for benign tumor of corpus uteri, third trimester: Secondary | ICD-10-CM | POA: Insufficient documentation

## 2022-10-17 DIAGNOSIS — O98313 Other infections with a predominantly sexual mode of transmission complicating pregnancy, third trimester: Secondary | ICD-10-CM | POA: Diagnosis not present

## 2022-10-17 DIAGNOSIS — O288 Other abnormal findings on antenatal screening of mother: Secondary | ICD-10-CM

## 2022-10-17 DIAGNOSIS — Z3480 Encounter for supervision of other normal pregnancy, unspecified trimester: Secondary | ICD-10-CM | POA: Insufficient documentation

## 2022-10-17 NOTE — Procedures (Signed)
Darlene Livingston [birth year] [redacted]w[redacted]d  Fetus A Non-Stress Test Interpretation for 10/17/22-NST with BPP  Indication: Oligohydramnios  Fetal Heart Rate A Mode: External Baseline Rate (A): 135 bpm Variability: Moderate Accelerations: 15 x 15 Decelerations: None Multiple birth?: No  Uterine Activity Mode: Toco Resting Tone Palpated: Relaxed  Interpretation (Fetal Testing) Nonstress Test Interpretation: Reactive Comments: Tracing reviewed by Dr. Darra Lis

## 2022-10-18 ENCOUNTER — Encounter: Payer: Self-pay | Admitting: *Deleted

## 2022-10-23 ENCOUNTER — Ambulatory Visit: Payer: Medicaid Other

## 2022-10-23 ENCOUNTER — Inpatient Hospital Stay (HOSPITAL_COMMUNITY)
Admission: AD | Admit: 2022-10-23 | Discharge: 2022-10-23 | Disposition: A | Discharge status: Home / Self Care | Payer: Medicaid Other | Attending: Obstetrics & Gynecology | Admitting: Obstetrics & Gynecology

## 2022-10-23 ENCOUNTER — Encounter (HOSPITAL_COMMUNITY): Payer: Self-pay | Admitting: Obstetrics & Gynecology

## 2022-10-23 ENCOUNTER — Ambulatory Visit: Payer: Medicaid Other | Attending: Obstetrics

## 2022-10-23 VITALS — BP 129/67

## 2022-10-23 DIAGNOSIS — A6000 Herpesviral infection of urogenital system, unspecified: Secondary | ICD-10-CM | POA: Diagnosis not present

## 2022-10-23 DIAGNOSIS — O4103X0 Oligohydramnios, third trimester, not applicable or unspecified: Secondary | ICD-10-CM | POA: Insufficient documentation

## 2022-10-23 DIAGNOSIS — E669 Obesity, unspecified: Secondary | ICD-10-CM

## 2022-10-23 DIAGNOSIS — O3413 Maternal care for benign tumor of corpus uteri, third trimester: Secondary | ICD-10-CM | POA: Diagnosis not present

## 2022-10-23 DIAGNOSIS — O4100X0 Oligohydramnios, unspecified trimester, not applicable or unspecified: Secondary | ICD-10-CM

## 2022-10-23 DIAGNOSIS — O99213 Obesity complicating pregnancy, third trimester: Secondary | ICD-10-CM | POA: Insufficient documentation

## 2022-10-23 DIAGNOSIS — O418X30 Other specified disorders of amniotic fluid and membranes, third trimester, not applicable or unspecified: Secondary | ICD-10-CM | POA: Diagnosis not present

## 2022-10-23 DIAGNOSIS — Z3480 Encounter for supervision of other normal pregnancy, unspecified trimester: Secondary | ICD-10-CM | POA: Insufficient documentation

## 2022-10-23 DIAGNOSIS — O98312 Other infections with a predominantly sexual mode of transmission complicating pregnancy, second trimester: Secondary | ICD-10-CM | POA: Diagnosis not present

## 2022-10-23 DIAGNOSIS — Z0371 Encounter for suspected problem with amniotic cavity and membrane ruled out: Secondary | ICD-10-CM | POA: Diagnosis present

## 2022-10-23 DIAGNOSIS — Z3A36 36 weeks gestation of pregnancy: Secondary | ICD-10-CM | POA: Diagnosis not present

## 2022-10-23 DIAGNOSIS — B009 Herpesviral infection, unspecified: Secondary | ICD-10-CM

## 2022-10-23 DIAGNOSIS — M5430 Sciatica, unspecified side: Secondary | ICD-10-CM | POA: Insufficient documentation

## 2022-10-23 DIAGNOSIS — D259 Leiomyoma of uterus, unspecified: Secondary | ICD-10-CM | POA: Diagnosis not present

## 2022-10-23 DIAGNOSIS — O98513 Other viral diseases complicating pregnancy, third trimester: Secondary | ICD-10-CM

## 2022-10-23 LAB — RUPTURE OF MEMBRANE (ROM)PLUS: Rom Plus: NEGATIVE

## 2022-10-23 LAB — POCT FERN TEST: POCT Fern Test: NEGATIVE

## 2022-10-23 MED ORDER — VALACYCLOVIR HCL 500 MG PO TABS
500.0000 mg | ORAL_TABLET | Freq: Two times a day (BID) | ORAL | 0 refills | Status: AC
Start: 2022-10-23 — End: 2022-11-22

## 2022-10-23 NOTE — MAU Note (Signed)
.  Darlene Livingston is a 32 y.o. at [redacted]w[redacted]d here in MAU reporting: AFI has slowly declined since 34 weeks, patient states occasionally she will have a gush of fluid and is unsure if it is her amniotic fluid. Denies VB. +FM. Reporting ctx intermittently today.   Pain score: 5 Vitals:   10/23/22 1256  BP: 136/76  Pulse: 92  Resp: 14  Temp: 98.7 F (37.1 C)  SpO2: 100%     FHT:122 Lab orders placed from triage:   fern

## 2022-10-23 NOTE — MAU Provider Note (Signed)
Event Date/Time  First Provider Initiated Contact with Patient 10/23/22 1354    S: Ms. Darlene Livingston is a 32 y.o. [id number] at [redacted]w[redacted]d  who presents to MAU today complaining of leaking of fluid for a few weeks, off and on. Does not saturate pad of underwear. Has noticed some leaking following urination.  She denies vaginal bleeding. She denies contractions. She reports normal fetal movement.    History of oligo; AFI 9/25 5.06, AFI 10/1 4.9 BPP 8/8> 10/10  O: BP 134/67   Pulse 77   Temp 98.7 F (37.1 C) (Oral)   Resp 14   LMP 02/12/2022   SpO2 100%  GENERAL: Well-developed, well-nourished female in no acute distress.  HEAD: Normocephalic, atraumatic.  CHEST: Normal effort of breathing, regular heart rate ABDOMEN: Soft, nontender, gravid PELVIC: Normal external female genitalia. Vagina is pink and rugated. Cervix with normal contour, no lesions. Normal discharge.  Negative pooling.   Cervical exam: deferred   Fetal Monitoring: Baseline: 125 bpm Variability: Moderate  Accelerations: 15x15 Decelerations: None Contractions: None  Results for orders placed or performed during the hospital encounter of 10/23/22 (from the past 24 hour(s))  Rupture of Membrane (ROM) Plus     Status: None   Collection Time: 10/23/22  1:53 PM  Result Value Ref Range   Rom Plus NEGATIVE   Fern Test     Status: None   Collection Time: 10/23/22  2:14 PM  Result Value Ref Range   POCT Fern Test Negative = intact amniotic membranes      A: SIUP at [redacted]w[redacted]d  Membranes intact Fern negative, Rom + negative   P: Patient discharged home with induction scheduled for 37 weeks.  Reviewed patient with Dr. Macon Large Patient very nervous, would like NST in the office Thursday.  Reviewed fetal kick counts Return to MAU if symptoms worsen.  Duane Lope, NP 10/23/2022 3:43 PM

## 2022-10-24 ENCOUNTER — Telehealth (HOSPITAL_COMMUNITY): Payer: Self-pay | Admitting: *Deleted

## 2022-10-24 ENCOUNTER — Encounter (HOSPITAL_COMMUNITY): Payer: Self-pay | Admitting: *Deleted

## 2022-10-24 LAB — GC/CHLAMYDIA PROBE AMP (~~LOC~~) NOT AT ARMC
Chlamydia: NEGATIVE
Comment: NEGATIVE
Comment: NORMAL
Neisseria Gonorrhea: NEGATIVE

## 2022-10-24 NOTE — Telephone Encounter (Signed)
Preadmission screen  

## 2022-10-25 ENCOUNTER — Ambulatory Visit (INDEPENDENT_AMBULATORY_CARE_PROVIDER_SITE_OTHER): Payer: Medicaid Other

## 2022-10-25 ENCOUNTER — Other Ambulatory Visit: Payer: Self-pay

## 2022-10-25 ENCOUNTER — Ambulatory Visit: Payer: Medicaid Other | Admitting: Advanced Practice Midwife

## 2022-10-25 VITALS — BP 130/82 | HR 86 | Wt 216.8 lb

## 2022-10-25 DIAGNOSIS — Z3A36 36 weeks gestation of pregnancy: Secondary | ICD-10-CM | POA: Diagnosis not present

## 2022-10-25 DIAGNOSIS — O4103X0 Oligohydramnios, third trimester, not applicable or unspecified: Secondary | ICD-10-CM

## 2022-10-25 DIAGNOSIS — A6004 Herpesviral vulvovaginitis: Secondary | ICD-10-CM | POA: Diagnosis not present

## 2022-10-25 DIAGNOSIS — Z22330 Carrier of Group B streptococcus: Secondary | ICD-10-CM

## 2022-10-25 DIAGNOSIS — O3663X0 Maternal care for excessive fetal growth, third trimester, not applicable or unspecified: Secondary | ICD-10-CM

## 2022-10-25 DIAGNOSIS — O99013 Anemia complicating pregnancy, third trimester: Secondary | ICD-10-CM

## 2022-10-25 DIAGNOSIS — O3662X0 Maternal care for excessive fetal growth, second trimester, not applicable or unspecified: Secondary | ICD-10-CM

## 2022-10-25 DIAGNOSIS — Z3480 Encounter for supervision of other normal pregnancy, unspecified trimester: Secondary | ICD-10-CM

## 2022-10-25 DIAGNOSIS — O4100X0 Oligohydramnios, unspecified trimester, not applicable or unspecified: Secondary | ICD-10-CM

## 2022-10-25 DIAGNOSIS — O288 Other abnormal findings on antenatal screening of mother: Secondary | ICD-10-CM

## 2022-10-25 NOTE — Progress Notes (Signed)
   PRENATAL VISIT NOTE  Subjective:  Darlene Livingston is a 32 y.o. [phone] at [redacted]w[redacted]d being seen today for ongoing prenatal care.  She is currently monitored for the following issues for this high-risk pregnancy and has Uterine leiomyoma; Carrier of group B Streptococcus; Supervision of other normal pregnancy, antepartum; Sciatic leg pain; Genital herpes; Excessive fetal growth affecting management of mother in second trimester, antepartum; Anemia in pregnancy, third trimester; and Oligohydramnios antepartum on their problem list.  Patient reports  intermittent leaking. Dx w/ Oligo. Sent to MAU for R/O ROM. Was fern, pool and ROm plus negative. Felt for leaking and testing was repeated and all negative.   .  Contractions: Irritability.  .  Movement: Present. Denies leaking of fluid.   The following portions of the patient's history were reviewed and updated as appropriate: allergies, current medications, past family history, past medical history, past social history, past surgical history and problem list.   Objective:   Vitals:   10/25/22 0914  BP: 130/82  Pulse: 86  Weight: 216 lb 12.8 oz (98.3 kg)    Fetal Status: Fetal Heart Rate (bpm): 145 Fundal Height: 35 cm Movement: Present  Presentation: Vertex  General:  Alert, oriented and cooperative. Patient is in no acute distress.  Skin: Skin is warm and dry. No rash noted.   Cardiovascular: Normal heart rate noted  Respiratory: Normal respiratory effort, no problems with respiration noted  Abdomen: Soft, gravid, appropriate for gestational age.  Pain/Pressure: Present     Pelvic: Cervical exam deferred        Extremities: Normal range of motion.  Edema: None  Mental Status: Normal mood and affect. Normal behavior. Normal judgment and thought content.   Assessment and Plan:  Pregnancy: [id number] at [redacted]w[redacted]d 1. 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 ON Valtrex for prophylaxis.   2. Supervision of other normal pregnancy, antepartum -  GBS+ in urine. Recent GC/Chlamydia neg  3. Oligohydramnios, antepartum, single or unspecified fetus - AFI 4.97 cm. Full work up fro ROM negative x 2. MFM recommends IOL at 37 weeks. Scheduled for 10/8.   4. Excessive fetal growth affecting management of pregnancy in second trimester, single or unspecified fetus - F/U Nml Est. FW: 2267 gm 5 lb 32 %   5. Carrier of group B Streptococcus - PCN in labor  6. Anemia in pregnancy, third trimester - Received Venofer infusions  7. [redacted] weeks gestation of pregnancy   Centering Pregnancy, Session#9: Reviewed resources in [employer].   Facilitated discussion today:   - Newborn care, safety and soothing techniques for infants - Perinatal mood and anxiety disorder activities; Agree/disagree, continuum  - Mindfulness activity with relaxation breathing, cat/cow back stretch  Fundal height and FHR appropriate today unless noted otherwise in plan. Patient to continue group care.    Term labor symptoms and general obstetric precautions including but not limited to vaginal bleeding, contractions, leaking of fluid and fetal movement were reviewed in detail with the patient. Please refer to After Visit Summary for other counseling recommendations.   No follow-ups on file.  Future Appointments  Date Time Provider Department Center  10/30/2022  7:00 AM [facility] SCHED ROOM [facility] None    Dorathy Kinsman, [place]

## 2022-10-29 ENCOUNTER — Inpatient Hospital Stay (HOSPITAL_COMMUNITY): Admission: RE | Admit: 2022-10-29 | Payer: Medicaid Other | Source: Home / Self Care

## 2022-10-29 ENCOUNTER — Inpatient Hospital Stay (HOSPITAL_COMMUNITY): Payer: Medicaid Other | Admitting: Anesthesiology

## 2022-10-29 ENCOUNTER — Encounter (HOSPITAL_COMMUNITY): Payer: Self-pay | Admitting: Family Medicine

## 2022-10-29 ENCOUNTER — Inpatient Hospital Stay (HOSPITAL_COMMUNITY)
Admission: AD | Admit: 2022-10-29 | Discharge: 2022-10-31 | DRG: 806 | Disposition: A | Discharge status: Home / Self Care | Payer: Medicaid Other | Attending: Family Medicine | Admitting: Family Medicine

## 2022-10-29 ENCOUNTER — Other Ambulatory Visit: Payer: Self-pay

## 2022-10-29 DIAGNOSIS — A6000 Herpesviral infection of urogenital system, unspecified: Secondary | ICD-10-CM | POA: Diagnosis present

## 2022-10-29 DIAGNOSIS — M5430 Sciatica, unspecified side: Secondary | ICD-10-CM

## 2022-10-29 DIAGNOSIS — O3413 Maternal care for benign tumor of corpus uteri, third trimester: Secondary | ICD-10-CM | POA: Diagnosis present

## 2022-10-29 DIAGNOSIS — Z3A37 37 weeks gestation of pregnancy: Secondary | ICD-10-CM | POA: Diagnosis not present

## 2022-10-29 DIAGNOSIS — O4103X0 Oligohydramnios, third trimester, not applicable or unspecified: Secondary | ICD-10-CM | POA: Diagnosis present

## 2022-10-29 DIAGNOSIS — Z825 Family history of asthma and other chronic lower respiratory diseases: Secondary | ICD-10-CM

## 2022-10-29 DIAGNOSIS — O99820 Streptococcus B carrier state complicating pregnancy: Secondary | ICD-10-CM | POA: Diagnosis not present

## 2022-10-29 DIAGNOSIS — O9902 Anemia complicating childbirth: Secondary | ICD-10-CM | POA: Diagnosis present

## 2022-10-29 DIAGNOSIS — Z3480 Encounter for supervision of other normal pregnancy, unspecified trimester: Principal | ICD-10-CM

## 2022-10-29 DIAGNOSIS — O99824 Streptococcus B carrier state complicating childbirth: Secondary | ICD-10-CM | POA: Diagnosis present

## 2022-10-29 DIAGNOSIS — Z833 Family history of diabetes mellitus: Secondary | ICD-10-CM

## 2022-10-29 DIAGNOSIS — Z818 Family history of other mental and behavioral disorders: Secondary | ICD-10-CM | POA: Diagnosis not present

## 2022-10-29 DIAGNOSIS — Z8249 Family history of ischemic heart disease and other diseases of the circulatory system: Secondary | ICD-10-CM | POA: Diagnosis not present

## 2022-10-29 DIAGNOSIS — D259 Leiomyoma of uterus, unspecified: Secondary | ICD-10-CM | POA: Diagnosis present

## 2022-10-29 DIAGNOSIS — O9832 Other infections with a predominantly sexual mode of transmission complicating childbirth: Secondary | ICD-10-CM | POA: Diagnosis present

## 2022-10-29 DIAGNOSIS — O4100X0 Oligohydramnios, unspecified trimester, not applicable or unspecified: Secondary | ICD-10-CM

## 2022-10-29 DIAGNOSIS — O26893 Other specified pregnancy related conditions, third trimester: Secondary | ICD-10-CM | POA: Diagnosis present

## 2022-10-29 LAB — CBC
HCT: 34 % — ABNORMAL LOW (ref 36.0–46.0)
Hemoglobin: 11.5 g/dL — ABNORMAL LOW (ref 12.0–15.0)
MCH: 30.6 pg (ref 26.0–34.0)
MCHC: 33.8 g/dL (ref 30.0–36.0)
MCV: 90.4 fL (ref 80.0–100.0)
Platelets: 288 10*3/uL (ref 150–400)
RBC: 3.76 MIL/uL — ABNORMAL LOW (ref 3.87–5.11)
RDW: 14.7 % (ref 11.5–15.5)
WBC: 3.4 10*3/uL — ABNORMAL LOW (ref 4.0–10.5)
nRBC: 0 % (ref 0.0–0.2)

## 2022-10-29 LAB — TYPE AND SCREEN
ABO/RH(D): A POS
Antibody Screen: NEGATIVE

## 2022-10-29 LAB — RPR: RPR Ser Ql: NONREACTIVE

## 2022-10-29 MED ORDER — PHENYLEPHRINE 80 MCG/ML (10ML) SYRINGE FOR IV PUSH (FOR BLOOD PRESSURE SUPPORT): 80.0000 ug | PREFILLED_SYRINGE | INTRAVENOUS | Status: DC | PRN

## 2022-10-29 MED ORDER — ONDANSETRON HCL 4 MG/2ML IJ SOLN: 4.0000 mg | Freq: Four times a day (QID) | INTRAMUSCULAR | Status: DC | PRN

## 2022-10-29 MED ORDER — EPHEDRINE 5 MG/ML INJ: 10.0000 mg | INTRAVENOUS | Status: DC | PRN

## 2022-10-29 MED ORDER — OXYCODONE-ACETAMINOPHEN 5-325 MG PO TABS: 2.0000 | ORAL_TABLET | ORAL | Status: DC | PRN

## 2022-10-29 MED ORDER — ZOLPIDEM TARTRATE 5 MG PO TABS: 5.0000 mg | ORAL_TABLET | Freq: Every evening | ORAL | Status: DC | PRN

## 2022-10-29 MED ORDER — DIPHENHYDRAMINE HCL 50 MG/ML IJ SOLN: 12.5000 mg | INTRAMUSCULAR | Status: DC | PRN

## 2022-10-29 MED ORDER — FENTANYL CITRATE (PF) 100 MCG/2ML IJ SOLN
100.0000 ug | Freq: Once | INTRAMUSCULAR | Status: AC
  Administered 2022-10-29: 100 ug via INTRAVENOUS
  Filled 2022-10-29: qty 2

## 2022-10-29 MED ORDER — LACTATED RINGERS IV SOLN
500.0000 mL | INTRAVENOUS | Status: DC | PRN
  Administered 2022-10-29: 500 mL via INTRAVENOUS

## 2022-10-29 MED ORDER — LACTATED RINGERS IV SOLN: INTRAVENOUS | Status: DC

## 2022-10-29 MED ORDER — OXYTOCIN-SODIUM CHLORIDE 30-0.9 UT/500ML-% IV SOLN: 1.0000 m[IU]/min | INTRAVENOUS | Status: DC

## 2022-10-29 MED ORDER — FENTANYL-BUPIVACAINE-NACL 0.5-0.125-0.9 MG/250ML-% EP SOLN
12.0000 mL/h | EPIDURAL | Status: DC | PRN
  Administered 2022-10-29: 12 mL/h via EPIDURAL
  Filled 2022-10-29: qty 250

## 2022-10-29 MED ORDER — OXYTOCIN-SODIUM CHLORIDE 30-0.9 UT/500ML-% IV SOLN
2.5000 [IU]/h | INTRAVENOUS | Status: DC
  Filled 2022-10-29: qty 500

## 2022-10-29 MED ORDER — WITCH HAZEL-GLYCERIN EX PADS: 1.0000 | MEDICATED_PAD | CUTANEOUS | Status: DC | PRN

## 2022-10-29 MED ORDER — DIPHENHYDRAMINE HCL 25 MG PO CAPS: 25.0000 mg | ORAL_CAPSULE | Freq: Four times a day (QID) | ORAL | Status: DC | PRN

## 2022-10-29 MED ORDER — LACTATED RINGERS IV SOLN: 500.0000 mL | Freq: Once | INTRAVENOUS | Status: DC

## 2022-10-29 MED ORDER — ONDANSETRON HCL 4 MG PO TABS: 4.0000 mg | ORAL_TABLET | ORAL | Status: DC | PRN

## 2022-10-29 MED ORDER — SOD CITRATE-CITRIC ACID 500-334 MG/5ML PO SOLN: 30.0000 mL | ORAL | Status: DC | PRN

## 2022-10-29 MED ORDER — OXYTOCIN BOLUS FROM INFUSION
333.0000 mL | Freq: Once | INTRAVENOUS | Status: AC
  Administered 2022-10-29: 333 mL via INTRAVENOUS

## 2022-10-29 MED ORDER — OXYCODONE-ACETAMINOPHEN 5-325 MG PO TABS: 1.0000 | ORAL_TABLET | ORAL | Status: DC | PRN

## 2022-10-29 MED ORDER — ACETAMINOPHEN 325 MG PO TABS: 650.0000 mg | ORAL_TABLET | ORAL | Status: DC | PRN

## 2022-10-29 MED ORDER — ONDANSETRON HCL 4 MG/2ML IJ SOLN: 4.0000 mg | INTRAMUSCULAR | Status: DC | PRN

## 2022-10-29 MED ORDER — COCONUT OIL OIL: 1.0000 | TOPICAL_OIL | Status: DC | PRN

## 2022-10-29 MED ORDER — PRENATAL MULTIVITAMIN CH
1.0000 | ORAL_TABLET | Freq: Every day | ORAL | Status: DC
  Administered 2022-10-30 – 2022-10-31 (×2): 1 via ORAL
  Filled 2022-10-29 (×2): qty 1

## 2022-10-29 MED ORDER — SIMETHICONE 80 MG PO CHEW: 80.0000 mg | CHEWABLE_TABLET | ORAL | Status: DC | PRN

## 2022-10-29 MED ORDER — SODIUM CHLORIDE 0.9 % IV SOLN
2.0000 g | Freq: Once | INTRAVENOUS | Status: AC
  Administered 2022-10-29: 2 g via INTRAVENOUS
  Filled 2022-10-29: qty 2000

## 2022-10-29 MED ORDER — TERBUTALINE SULFATE 1 MG/ML IJ SOLN: 0.2500 mg | Freq: Once | INTRAMUSCULAR | Status: DC | PRN

## 2022-10-29 MED ORDER — FENTANYL-BUPIVACAINE-NACL 0.5-0.125-0.9 MG/250ML-% EP SOLN: 12.0000 mL/h | EPIDURAL | Status: DC | PRN

## 2022-10-29 MED ORDER — IBUPROFEN 600 MG PO TABS
600.0000 mg | ORAL_TABLET | Freq: Four times a day (QID) | ORAL | Status: DC
  Administered 2022-10-29 – 2022-10-31 (×8): 600 mg via ORAL
  Filled 2022-10-29 (×8): qty 1

## 2022-10-29 MED ORDER — BENZOCAINE-MENTHOL 20-0.5 % EX AERO: 1.0000 | INHALATION_SPRAY | CUTANEOUS | Status: DC | PRN

## 2022-10-29 MED ORDER — TETANUS-DIPHTH-ACELL PERTUSSIS 5-2.5-18.5 LF-MCG/0.5 IM SUSY: 0.5000 mL | PREFILLED_SYRINGE | Freq: Once | INTRAMUSCULAR | Status: DC

## 2022-10-29 MED ORDER — SODIUM CHLORIDE 0.9 % IV SOLN
1.0000 g | INTRAVENOUS | Status: DC
  Filled 2022-10-29 (×2): qty 1000

## 2022-10-29 MED ORDER — LIDOCAINE HCL (PF) 1 % IJ SOLN
INTRAMUSCULAR | Status: DC | PRN
  Administered 2022-10-29: 8 mL via EPIDURAL

## 2022-10-29 MED ORDER — FLEET ENEMA RE ENEM: 1.0000 | ENEMA | Freq: Every day | RECTAL | Status: DC | PRN

## 2022-10-29 MED ORDER — SENNOSIDES-DOCUSATE SODIUM 8.6-50 MG PO TABS
2.0000 | ORAL_TABLET | Freq: Every day | ORAL | Status: DC
  Administered 2022-10-30 – 2022-10-31 (×2): 2 via ORAL
  Filled 2022-10-29 (×2): qty 2

## 2022-10-29 MED ORDER — LIDOCAINE HCL (PF) 1 % IJ SOLN: 30.0000 mL | INTRAMUSCULAR | Status: DC | PRN

## 2022-10-29 MED ORDER — DIBUCAINE (PERIANAL) 1 % EX OINT: 1.0000 | TOPICAL_OINTMENT | CUTANEOUS | Status: DC | PRN

## 2022-10-29 NOTE — Discharge Summary (Signed)
Postpartum Discharge Summary  Date of Service updated***     Patient Name: Darlene Livingston DOB: 07-24-1990 MRN: [id number]  Date of admission: 10/29/2022 Delivery date:10/29/2022 Delivering provider:   Date of discharge: 10/29/2022  Admitting diagnosis: Normal labor [O80, Z37.9] Intrauterine pregnancy: [redacted]w[redacted]d     Secondary diagnosis:  Active Problems:   Normal labor  Additional problems: ***    Discharge diagnosis: Term Pregnancy Delivered                                              Post partum procedures: none Augmentation: N/A Complications: None  Hospital course: Onset of Labor With Vaginal Delivery      32 y.o. yo [id number] at [redacted]w[redacted]d was admitted in Active Labor on 10/29/2022. Labor course was uncomplicated.  Membrane Rupture Time/Date: 11:00 AM,10/29/2022  Delivery Method:Vaginal, Spontaneous Operative Delivery:N/A Episiotomy:  none Lacerations:   none Patient had a postpartum course complicated by ***.  She is ambulating, tolerating a regular diet, passing flatus, and urinating well. Patient is discharged home in stable condition on 10/29/22.  Newborn Data: Birth date:10/29/2022 Birth time:3:24 PM Gender:Female Living status:Living Apgars:9 ,9  Weight:   Magnesium Sulfate received: No BMZ received: No Rhophylac:N/A MMR:{MMR:30440033} T-DaP:Given prenatally Flu: N/A RSV Vaccine received: No Transfusion:No  Immunizations received: Immunization History  Administered Date(s) Administered   HPV 9-valent 06/11/2016, 08/10/2016   Influenza Split 02/23/2012   Tdap 02/24/2012, 06/09/2014, 08/22/2022    Physical exam  Vitals:   10/29/22 1440 10/29/22 1500 10/29/22 1505 10/29/22 1545  BP:  (!) 98/59    Pulse:  85    Resp:  18    Temp:    98.2 F (36.8 C)  TempSrc:    Oral  SpO2: 100% 100% 100%   Weight:      Height:       General: {Exam; general:21111117} Lochia: {Desc; appropriate/inappropriate:30686::"appropriate"} Uterine Fundus: {Desc;  firm/soft:30687} Incision: {Exam; incision:21111123} DVT Evaluation: {Exam; dvt:2111122} Labs: Lab Results  Component Value Date   WBC 3.4 (L) 10/29/2022   HGB 11.5 (L) 10/29/2022   HCT 34.0 (L) 10/29/2022   MCV 90.4 10/29/2022   PLT 288 10/29/2022      Latest Ref Rng & Units 09/20/2022   12:06 AM  CMP  Glucose 70 - 99 mg/dL 147   BUN 6 - 20 mg/dL 6   Creatinine 8.29 - 5.62 mg/dL 1.30   Sodium 865 - 784 mmol/L 137   Potassium 3.5 - 5.1 mmol/L 3.7   Chloride 98 - 111 mmol/L 106   CO2 22 - 32 mmol/L 22   Calcium 8.9 - 10.3 mg/dL 8.9   Total Protein 6.5 - 8.1 g/dL 6.1   Total Bilirubin 0.3 - 1.2 mg/dL 0.4   Alkaline Phos 38 - 126 U/L 61   AST 15 - 41 U/L 18   ALT 0 - 44 U/L 14    Edinburgh Score:     No data to display         No data recorded  After visit meds:  Allergies as of 10/29/2022       Reactions   Fruit Blend Other (See Comments)   Sore throat from eating acidic fresh fruits   Other Other (See Comments)   Sore throat from eating acidic fresh fruits     Med Rec must be completed prior to using this [facility]***  Discharge home in stable condition Infant Feeding: {Baby feeding:23562} Infant Disposition:{CHL IP OB HOME WITH WUJWJX:91478} Discharge instruction: per After Visit Summary and Postpartum booklet. Activity: Advance as tolerated. Pelvic rest for 6 weeks.  Diet: {OB GNFA:21308657} Future Appointments:No future appointments. Follow up Visit:   Please schedule this patient for a In person postpartum visit in 4 weeks with the following provider: Any provider. Additional Postpartum F/U: none   Low risk pregnancy complicated by:  none Delivery mode:  Vaginal, Spontaneous Anticipated Birth Control:  Unsure   10/29/2022 Levie Heritage, DO

## 2022-10-29 NOTE — Anesthesia Preprocedure Evaluation (Signed)
Anesthesia Evaluation  Patient identified by MRN, date of birth, ID band Patient awake    Reviewed: Allergy & Precautions, H&P , NPO status , Patient's Chart, lab work & pertinent test results, reviewed documented beta blocker date and time   Airway Mallampati: I  TM Distance: >3 FB Neck ROM: full    Dental no notable dental hx. (+) Teeth Intact, Dental Advisory Given   Pulmonary neg pulmonary ROS   Pulmonary exam normal breath sounds clear to auscultation       Cardiovascular negative cardio ROS Normal cardiovascular exam Rhythm:regular Rate:Normal     Neuro/Psych  Headaches  Neuromuscular disease negative neurological ROS  negative psych ROS   GI/Hepatic negative GI ROS, Neg liver ROS,,,  Endo/Other  negative endocrine ROS    Renal/GU negative Renal ROS  negative genitourinary   Musculoskeletal   Abdominal   Peds  Hematology negative hematology ROS (+) Blood dyscrasia, anemia   Anesthesia Other Findings   Reproductive/Obstetrics (+) Pregnancy                             Anesthesia Physical Anesthesia Plan  ASA: 2  Anesthesia Plan: Epidural   Post-op Pain Management: Minimal or no pain anticipated   Induction: Intravenous  PONV Risk Score and Plan: 2 and Treatment may vary due to age or medical condition  Airway Management Planned: Natural Airway  Additional Equipment: None  Intra-op Plan:   Post-operative Plan:   Informed Consent: I have reviewed the patients History and Physical, chart, labs and discussed the procedure including the risks, benefits and alternatives for the proposed anesthesia with the patient or authorized representative who has indicated his/her understanding and acceptance.     Dental Advisory Given  Plan Discussed with: Anesthesiologist  Anesthesia Plan Comments: (Labs checked- platelets confirmed with RN in room. Fetal heart tracing, per RN,  reported to be stable enough for sitting procedure. Discussed epidural, and patient consents to the procedure:  included risk of possible headache,backache, failed block, allergic reaction, and nerve injury. This patient was asked if she had any questions or concerns before the procedure started.)       Anesthesia Quick Evaluation

## 2022-10-29 NOTE — MAU Note (Signed)
.  Makenzye Troutman Keep is a 32 y.o. at [redacted]w[redacted]d here in MAU reporting: Nurse's station received call from lobby stating that patient was pushing. Milana Na, Spurlock-Frizzel, and Dr. Judd Lien at bedside as patient brought back.  Contractions every: 3-5 minutes Onset of ctx: On-going Pain score: Pain Assessment Pain Assessment: 0-10 Pain Score: 7  Pain Location: Abdomen Pain Descriptors / Indicators: Contraction Pain Frequency: Intermittent Pain Onset: On-going  ROM: SROM at 1100; grossly ruptured on SVE; clear/red   Vaginal Bleeding:  Last SVE: N/A155  Epidural: Planning  Fetal Movement: Reports positive FM FHT: Fetal Heart Rate Mode: External Baseline Rate (A): 155 bpm  Vitals:   10/29/22 1142  BP: 138/76  Pulse: 81  Resp: (!) 24      OB Office: Faculty GBS: Positive Lab orders placed from triage: MAU Labor Eval

## 2022-10-29 NOTE — Lactation Note (Signed)
This note was copied from a baby's chart. Lactation Consultation Note  Patient Name: Girl Cherri Yera [id number] Date: 10/29/2022 Age:30 hours Reason for consult: Initial assessment;Early term 37-38.6wks;Infant < 6lbs.  P3, ETI female infant, this will be infant's first latch with LC on MBU, MOB latched infant on her left breast using pillow support, infant latched with depth, actively feeding, swallows were heard, infant BF for 15 minutes. Afterwards MOB after receiving teaching on hand expression by LC, self expressed 5 mls of EBM that was spoon fed to infant. LC set MOB up with DEBP using a 21 mm breast flange and MOB was expressing colostrum in flange when LC left the room. Due to infant being less than 6 lbs and 37 weeks MOB plans to supplement infant with her own EBM after each feeding. LC discussed the importance of maternal rest, diet and hydration. MOB made aware of O/P services, breastfeeding support groups, community resources, and our phone # for post-discharge questions.    MOB understands that EBM is safe for 4 hours at room temperature.  Today's current feeding plans: 1- MOB will BF infant by cues, on demand 8+ times within 24 hours, STS. 2- Afterwards MOB will supplement infant with her own EBM from hand expression or using the DEBP. Plans to continue to offer 5 mls or more each feeding using a spoon on day 1. 3- Will continue to use DEBP every 3 hours for 15 minutes on initial setting.   Maternal Data Has patient been taught Hand Expression?: Yes Does the patient have breastfeeding experience prior to this delivery?: Yes How long did the patient breastfeed?: Per Birth Parent, she BF 1st and 2nd child for 6 months each, her 2nd child is currently 8 years.  Feeding Mother's Current Feeding Choice: Breast Milk  LATCH Score Latch: Grasps breast easily, tongue down, lips flanged, rhythmical sucking.  Audible Swallowing: Spontaneous and intermittent  Type of Nipple: Everted at  rest and after stimulation  Comfort (Breast/Nipple): Soft / non-tender  Hold (Positioning): Assistance needed to correctly position infant at breast and maintain latch.  LATCH Score: 9   Lactation Tools Discussed/Used    Interventions Interventions: Skin to skin;Hand express;Breast massage;Breast compression;Adjust position;Support pillows;Position options;DEBP;Hand pump;Education;Guidelines for Milk Supply and Pumping Schedule Handout;LC Services brochure;LPT handout/interventions  Discharge Pump:  (LC sent STORK DEBP referral)  Consult Status Consult Status: Follow-up Date: 10/30/22 Follow-up type: In-patient    Frederico Hamman 10/29/2022, 6:31 PM

## 2022-10-29 NOTE — H&P (Signed)
OBSTETRIC ADMISSION HISTORY AND PHYSICAL   Darlene Livingston is a 32 y.o. female [phone] with IUP at [redacted]w[redacted]d by LMP presenting for painful contractions after gush of fluids. She reports +FMs, no VB, no blurry vision, headaches or peripheral edema, and RUQ pain.  Pt grossly ruptured with clear fluid in MAU, negative speculum exam given hx of HSV.    She plans on breast & bottle feeding. She's unsure for birth control. 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  English               LAB RESULTS   Support Person FOB Genetics NIPS:LR female    AFP:   DNKA               NT/IT (FT only)        Carrier Screen Horizon: Nml  Rhogam  A/Positive/-- (06/03 1547) A1C/GTT Early:  5.3           Third trimester:wnl  Flu Vaccine Received       TDaP Vaccine  08/22/2022 Blood Type A/Positive/-- (06/03 1547)  Covid Vaccine 2 doses pfizer Antibody Negative (06/03 1547)  RSV Declined Rubella 3.15 (06/03 1547)  Feeding Plan both RPR Non Reactive (06/03 1547)  Contraception Yes, unsure HBsAg Negative (06/03 1547)  Circumcision NA HIV Non Reactive (06/03 1547)  [facility]  HCVAb Non Reactive (06/03 1547)  Prenatal Classes             Pap No results found for: "DIAGPAP"  BTLConsent   GC/CT Initial:             36wks:  VBAC  Consent   GBS For PCN allergy, check sensitivities            DME Rx [ ]  BP cuff [ ]  Weight Scale Waterbirth  [ ]  Class [ ]  Consent [ ]  CNM visit  PHQ9 & GAD7 [  ] new OB [  x] 28 weeks  [  ] 36 weeks Induction  [ ]  Orders Entered [ ] Foley Y/N        Past Medical History:     Past Medical History:  Diagnosis Date   Chlamydia     Genital herpes 10/11/2013    Had vulvar lesion culture positive for HSV2   Headache(784.0)     Miscarriage      x2, one at 13weeks, one at [redacted] weeks gestation   Vaginal pain 06/04/2016    pt reports redundant tissue at introitus cause discomfort and requesting removal- wil sched in office mobile  anesthesia of R post introitus          Past Surgical History:      Past Surgical History:  Procedure Laterality Date   THERAPEUTIC ABORTION   07/19/2020    Done at [facility], [redacted] weeks gestation          Obstetrical History: OB History       Gravida  6   Para  2   Term  2   Preterm  0   AB  3   Living  2        SAB  2   IAB  1   Ectopic  0   Multiple  0   Live Births  2               Social History Social History         Socioeconomic History   Marital status: Single      Spouse name: Not on file   Number of children: Not on file   Years of education: Not on file   Highest education level: Not on file  Occupational History   Not on file  Tobacco Use   Smoking status: Never   Smokeless tobacco: Never  Vaping Use   Vaping status: Never Used  Substance and Sexual Activity   Alcohol use: No      Comment: social   Drug use: Not Currently      Comment: Delta 8 CBD   Sexual activity: Not Currently      Partners: Male      Birth control/protection: None  Other Topics Concern   Not on file  Social History Narrative   Not on file    Social Determinants of Health    Financial Resource Strain: Not on file   Food Insecurity: Not on file  Transportation Needs: Not on file  Physical Activity: Not on file  Stress: Not on file  Social Connections: Not on file      Family History:      Family History  Problem Relation Age of Onset   Hypertension Mother     Depression Maternal Aunt     Diabetes Maternal Aunt     Depression Maternal Uncle     Diabetes Maternal Uncle     Asthma Paternal Aunt     Cancer Paternal Aunt     Depression Paternal Aunt     Diabetes Paternal Aunt     Asthma Paternal Uncle     Depression Paternal Uncle     Diabetes Paternal Uncle     Birth defects Cousin     Anesthesia problems Neg Hx     Hypotension Neg Hx     Malignant hyperthermia Neg Hx     Pseudochol deficiency Neg Hx            Allergies: Allergies       Allergies  Allergen Reactions   Fruit Blend Other (See Comments)      Sore throat from eating acidic fresh fruits   Other Other (See Comments)      Sore throat from eating acidic fresh fruits               Medications Prior to Admission  Medication Sig Dispense Refill Last Dose   acetaminophen (TYLENOL) 500 MG tablet Take 2 tablets (1,000 mg total) by mouth every 6 (six) hours as needed for moderate pain or mild pain. 100 tablet 0     amoxicillin (AMOXIL) 500 MG  capsule Take 1 capsule (500 mg total) by mouth 3 (three) times daily. 21 capsule 0     Prenatal 28-0.8 MG TABS Take 1 tablet by mouth daily. 30 tablet 12     valACYclovir (VALTREX) 500 MG tablet Take 1 tablet (500 mg total) by mouth 2 (two) times daily. 30 tablet 0              Review of Systems    All systems reviewed and negative except as stated in HPI   Last menstrual period 02/12/2022, unknown if currently breastfeeding. General appearance: alert, cooperative, appears stated age, and painful with contractions Lungs: clear to auscultation bilaterally Heart: regular rate and rhythm Abdomen: soft, non-tender; bowel sounds normal Pelvic: negative for any active HSV lesion  on speculum exam, grossly ruptured, clear fluid with some blood tinge Extremities: Homans sign is negative, no sign of DVT Presentation: cephalic Fetal monitoringBaseline: 145 bpm, Variability: Good {> 6 bpm), Accelerations: Reactive, and Decelerations: Absent Uterine activity Q23mins  Dilation: 4 Effacement (%): 80 Station: -2 Exam by:: Dr. Judd Lien     Prenatal labs: ABO, Rh: A/Positive/-- (06/03 1547) Antibody: Negative (06/03 1547) Rubella: 3.15 (06/03 1547) RPR: Non Reactive (08/07 0905)  HBsAg: Negative (06/03 1547)  HIV: Non Reactive (08/07 0905)  GBS:    Glucola 5.3, 2hr normal  Genetic screening  LR female AFP DNKA, Horizon neg 4/4 Anatomy US wnl, lower segment fibroid 5x5x4cm   Prenatal Transfer Tool  Maternal Diabetes: No Genetic Screening: Normal Maternal Ultrasounds/Referrals: Other: oligo (10/3 AFI 5.46, no pockets 2x2), fibroid  Fetal Ultrasounds or other Referrals:  None Maternal Substance Abuse:  No Significant Maternal Medications:  Meds include: Other: Valacyclovir  Significant Maternal Lab Results:  Group B Strep positive Number of Prenatal Visits:greater than 3 verified prenatal visits Other Comments:  None   No results found for this or any previous visit (from the past 24 hour(s)).       Patient Active Problem List    Diagnosis Date Noted   Normal labor 10/29/2022   Oligohydramnios antepartum 10/10/2022   Anemia in pregnancy, third trimester 08/30/2022   Sciatic leg pain 06/17/2022   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   Supervision of other normal pregnancy, antepartum 06/13/2022   Uterine leiomyoma 01/28/2014   Genital herpes 10/11/2013      Assessment/Plan:  Darlene Livingston is a 32 y.o. [id number] at [redacted]w[redacted]d here for SOL, SROM ~1045.    #Labor: Expectant management until GBS ppx adequate, then pit if need for augmentation #Pain:  Planning epidural #FWB: Cat1 #ID:      GBS +, Amp #MOF: both #MOC:unsure   #HSV: no prodromal symptoms,  compliant with suppression, negative spec on presentation in MAU   #Oligohydramnios: last [place] 10/3 >5cm but not 2x2 pocket, grossly rupture on presentation, clear fluid   #Uterine leiomyoma: lower segment 5x5x4cm   #Anemia of pregnancy: - f/u admission CBC   Levie Heritage, DO 10/29/2022 12:49 PM

## 2022-10-29 NOTE — Anesthesia Procedure Notes (Signed)
Epidural Patient location during procedure: OB Start time: 10/29/2022 12:58 PM End time: 10/29/2022 1:03 PM  Staffing Anesthesiologist: Bethena Midget, MD  Preanesthetic Checklist Completed: patient identified, IV checked, site marked, risks and benefits discussed, surgical consent, monitors and equipment checked, pre-op evaluation and timeout performed  Epidural Patient position: sitting Prep: DuraPrep and site prepped and draped Patient monitoring: continuous pulse ox and blood pressure Approach: midline Location: L3-L4 Injection technique: LOR air  Needle:  Needle type: Tuohy  Needle gauge: 17 G Needle length: 9 cm and 9 Needle insertion depth: 6 cm Catheter type: closed end flexible Catheter size: 19 Gauge Catheter at skin depth: 12 cm Test dose: negative  Assessment Events: blood not aspirated, no cerebrospinal fluid, injection not painful, no injection resistance, no paresthesia and negative IV test

## 2022-10-29 NOTE — MAU Provider Note (Signed)
OBSTETRIC ADMISSION HISTORY AND PHYSICAL  Darlene Livingston is a 32 y.o. female [phone] with IUP at [redacted]w[redacted]d by LMP presenting for painful contractions after gush of fluids. She reports +FMs, no VB, no blurry vision, headaches or peripheral edema, and RUQ pain.  Pt grossly ruptured with clear fluid in MAU, negative speculum exam given hx of HSV.   She plans on breast & bottle feeding. She's unsure for birth control. She received her prenatal care at [facility]   Dating: By LMP --->  Estimated Date of Delivery: 11/19/22  Sono:    @[redacted]w[redacted]d , CWD, normal anatomy, cephalic presentation, posterior placental lie, 2267g, 32%% EFW   Prenatal History/Complications:  Patient Active Problem List   Diagnosis Date Noted   Normal labor 10/29/2022   Oligohydramnios antepartum 10/10/2022   Anemia in pregnancy, third trimester 08/30/2022   Sciatic leg pain 06/17/2022   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   Supervision of other normal pregnancy, antepartum 06/13/2022   Uterine leiomyoma 01/28/2014   Genital herpes 10/11/2013   NURSING  PROVIDER  Office Location [facility] Dating by LMP c/w U/S at 10 wks  [facility] Model Centering Anatomy U/S WNL, fibroid  Initiated care at  [employer]  English               LAB RESULTS   Support Person FOB Genetics NIPS:LR female    AFP:   DNKA               NT/IT (FT only)        Carrier Screen Horizon: Nml  Rhogam  A/Positive/-- (06/03 1547) A1C/GTT Early:  5.3           Third trimester:wnl  Flu Vaccine Received       TDaP Vaccine  08/22/2022 Blood Type A/Positive/-- (06/03 1547)  Covid Vaccine 2 doses pfizer Antibody Negative (06/03 1547)  RSV Declined Rubella 3.15 (06/03 1547)  Feeding Plan both RPR Non Reactive (06/03 1547)  Contraception Yes, unsure HBsAg Negative (06/03 1547)  Circumcision NA HIV Non Reactive (06/03 1547)  [facility]  [facility]  HCVAb Non Reactive (06/03 1547)  Prenatal Classes            Pap No  results found for: "DIAGPAP"  BTLConsent   GC/CT Initial:             36wks:  VBAC  Consent   GBS For PCN allergy, check sensitivities            DME Rx [ ]  BP cuff [ ]  Weight Scale Waterbirth  [ ]  Class [ ]  Consent [ ]  CNM visit  PHQ9 & GAD7 [  ] new OB [  x] 28 weeks  [  ] 36 weeks Induction  [ ]  Orders Entered [ ] Foley Y/N     Past Medical History: 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   Headache(784.0)    Miscarriage    x2, one at 13weeks, one at [redacted] weeks gestation   Vaginal pain 06/04/2016   pt reports redundant tissue at introitus cause discomfort and requesting removal- wil sched in office mobile anesthesia of R post introitus    Past Surgical History: Past Surgical History:  Procedure Laterality Date   THERAPEUTIC ABORTION  07/19/2020   Done at [facility], [redacted]  weeks gestation    Obstetrical History: OB History     Gravida  6   Para  2   Term  2   Preterm  0   AB  3   Living  2      SAB  2   IAB  1   Ectopic  0   Multiple  0   Live Births  2           Social History Social History   Socioeconomic History   Marital status: Single    Spouse name: Not on file   Number of children: Not on file   Years of education: Not on file   Highest education level: Not on file  Occupational History   Not on file  Tobacco Use   Smoking status: Never   Smokeless tobacco: Never  Vaping Use   Vaping status: Never Used  Substance and Sexual Activity   Alcohol use: No    Comment: social   Drug use: Not Currently    Comment: Delta 8 CBD   Sexual activity: Not Currently    Partners: Male    Birth control/protection: None  Other Topics Concern   Not on file  Social History Narrative   Not on file   Social Determinants of Health   Financial Resource Strain: Not on file  Food Insecurity: Not on file  Transportation Needs: Not on file  Physical Activity:  Not on file  Stress: Not on file  Social Connections: Not on file    Family History: Family History  Problem Relation Age of Onset   Hypertension Mother    Depression Maternal Aunt    Diabetes Maternal Aunt    Depression Maternal Uncle    Diabetes Maternal Uncle    Asthma Paternal Aunt    Cancer Paternal Aunt    Depression Paternal Aunt    Diabetes Paternal Aunt    Asthma Paternal Uncle    Depression Paternal Uncle    Diabetes Paternal Uncle    Birth defects Cousin    Anesthesia problems Neg Hx    Hypotension Neg Hx    Malignant hyperthermia Neg Hx    Pseudochol deficiency Neg Hx     Allergies: Allergies  Allergen Reactions   Fruit Blend Other (See Comments)    Sore throat from eating acidic fresh fruits   Other Other (See Comments)    Sore throat from eating acidic fresh fruits    Medications Prior to Admission  Medication Sig Dispense Refill Last Dose   acetaminophen (TYLENOL) 500 MG tablet Take 2 tablets (1,000 mg total) by mouth every 6 (six) hours as needed for moderate pain or mild pain. 100 tablet 0    amoxicillin (AMOXIL) 500 MG capsule Take 1 capsule (500 mg total) by mouth 3 (three) times daily. 21 capsule 0    Prenatal 28-0.8 MG TABS Take 1 tablet by mouth daily. 30 tablet 12    valACYclovir (VALTREX) 500 MG tablet Take 1 tablet (500 mg total) by mouth 2 (two) times daily. 30 tablet 0      Review of Systems   All systems reviewed and negative except as stated in HPI  Last menstrual period 02/12/2022, unknown if currently breastfeeding. General appearance: alert, cooperative, appears stated age, and painful with contractions Lungs: clear to auscultation bilaterally Heart: regular rate and rhythm Abdomen: soft, non-tender; bowel sounds normal Pelvic: negative for any active HSV lesion on speculum exam, grossly ruptured, clear fluid with some blood tinge  Extremities: Homans sign is negative, no sign of DVT Presentation: cephalic Fetal  monitoringBaseline: 145 bpm, Variability: Good {> 6 bpm), Accelerations: Reactive, and Decelerations: Absent Uterine activity Q78mins  Dilation: 4 Effacement (%): 80 Station: -2 Exam by:: Dr. Judd Lien   Prenatal labs: ABO, Rh: A/Positive/-- (06/03 1547) Antibody: Negative (06/03 1547) Rubella: 3.15 (06/03 1547) RPR: Non Reactive (08/07 0905)  HBsAg: Negative (06/03 1547)  HIV: Non Reactive (08/07 0905)  GBS:    Glucola 5.3, 2hr normal  Genetic screening  LR female AFP DNKA, Horizon neg 4/4 Anatomy US wnl, lower segment fibroid 5x5x4cm  Prenatal Transfer Tool  Maternal Diabetes: No Genetic Screening: Normal Maternal Ultrasounds/Referrals: Other: oligo (10/3 AFI 5.46, no pockets 2x2), fibroid  Fetal Ultrasounds or other Referrals:  None Maternal Substance Abuse:  No Significant Maternal Medications:  Meds include: Other: Valacyclovir  Significant Maternal Lab Results:  Group B Strep positive Number of Prenatal Visits:greater than 3 verified prenatal visits Other Comments:  None  No results found for this or any previous visit (from the past 24 hour(s)).  Patient Active Problem List   Diagnosis Date Noted   Normal labor 10/29/2022   Oligohydramnios antepartum 10/10/2022   Anemia in pregnancy, third trimester 08/30/2022   Sciatic leg pain 06/17/2022   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   Supervision of other normal pregnancy, antepartum 06/13/2022   Uterine leiomyoma 01/28/2014   Genital herpes 10/11/2013    Assessment/Plan:  CALIAH KOPKE is a 32 y.o. N5A2130 at [redacted]w[redacted]d here for SOL, SROM ~1045.   #Labor: Expectant management until GBS ppx adequate, then pit if need for augmentation #Pain: Planning epidural #FWB: Cat1 #ID:  GBS +, Amp #MOF: both #MOC:unsure  #HSV: no prodromal symptoms, compliant with suppression, negative spec on presentation in MAU  #Oligohydramnios: last [place] 10/3 >5cm but not 2x2 pocket, grossly rupture on presentation, clear  fluid  #Uterine leiomyoma: lower segment 5x5x4cm  #Anemia of pregnancy: - f/u admission CBC   Hessie Dibble, MD  10/29/2022, 11:42 AM

## 2022-10-30 ENCOUNTER — Inpatient Hospital Stay (HOSPITAL_COMMUNITY): Payer: Medicaid Other

## 2022-10-30 LAB — CBC
HCT: 30.8 % — ABNORMAL LOW (ref 36.0–46.0)
Hemoglobin: 10.2 g/dL — ABNORMAL LOW (ref 12.0–15.0)
MCH: 30.5 pg (ref 26.0–34.0)
MCHC: 33.1 g/dL (ref 30.0–36.0)
MCV: 92.2 fL (ref 80.0–100.0)
Platelets: 267 10*3/uL (ref 150–400)
RBC: 3.34 MIL/uL — ABNORMAL LOW (ref 3.87–5.11)
RDW: 15 % (ref 11.5–15.5)
WBC: 5.4 10*3/uL (ref 4.0–10.5)
nRBC: 0 % (ref 0.0–0.2)

## 2022-10-30 NOTE — Anesthesia Postprocedure Evaluation (Signed)
Anesthesia Post Note  Patient: Merrissa Giacobbe Cayton  Procedure(s) Performed: AN AD HOC LABOR EPIDURAL     Patient location during evaluation: Mother Baby Anesthesia Type: Epidural Level of consciousness: awake and alert and oriented Pain management: satisfactory to patient Vital Signs Assessment: post-procedure vital signs reviewed and stable Respiratory status: respiratory function stable Cardiovascular status: stable Postop Assessment: no headache, no backache, epidural receding, patient able to bend at knees, no signs of nausea or vomiting, adequate PO intake and able to ambulate Anesthetic complications: no   No notable events documented.  Last Vitals:  Vitals:   10/30/22 0436 10/30/22 0745  BP: 108/72 110/69  Pulse: 60 72  Resp: 18 18  Temp: 36.6 C 36.4 C  SpO2: 100% 98%    Last Pain:  Vitals:   10/30/22 0745  TempSrc: Axillary  PainSc: 0-No pain   Pain Goal:                   Kashaun Bebo

## 2022-10-30 NOTE — Lactation Note (Signed)
This note was copied from a baby's chart. Lactation Consultation Note  Patient Name: Darlene Livingston [id number] Date: 10/30/2022 Age:30 hours Reason for consult: Follow-up assessment;Early term 37-38.6wks;Infant weight loss (weight loss -2.80%) LC did not observe latch, Per MOB, she recently attempt to latch infant but infant was sleepy, she gave infant 17 mls of EBM that she pumped and offer in Dr. Irving Burton nipple that infant consumed well without any difficulties. MOB was pumping with hand pump when LC entered room and expressed 20 mls.Per MOB, she does not like the DEBP and prefers the hand pump instead. MOB is aware that  donor breast milk is available if needed.   Today's feeding plan: 1- Continue to latch infant 1st every feeding after 5 minutes if infant doesn't latch, supplement with her EBM that has been pumped. Birth Parent knows that if infant does not latch to offer 15-30 mls of EBM on Day 2 of EBM per feeding or more if infant wants it. MOB has handout " Feeding Guidelines" 2- Continue to ask RN/LC for latch assistance if needed.  3- Birth Parent will continue to stay hydrated, have balance meals and rest.  Maternal Data    Feeding Mother's Current Feeding Choice: Breast Milk Nipple Type:  (Dr, Irving Burton from home)  [facility] Score                    Lactation Tools Discussed/Used Tools: Pump;Flanges Flange Size: 21 Breast pump type: Double-Electric Breast Pump;Manual Reason for Pumping: Infant been sleepy today, ETI female infant , less than 6 lbs Pumped volume: 20 mL (MOB was still pumping when LC left the room.)  Interventions    Discharge    Consult Status Consult Status: Follow-up Date: 10/31/22 Follow-up type: In-patient    Frederico Hamman 10/30/2022, 7:15 PM

## 2022-10-30 NOTE — Progress Notes (Signed)
Post Partum Day 1 Subjective: no complaints, up ad lib, voiding, and tolerating PO  Objective: Blood pressure 110/69, pulse 72, temperature 97.6 F (36.4 C), temperature source Axillary, resp. rate 18, height 5\' 6"  (1.676 m), weight 98 kg, last menstrual period 02/12/2022, SpO2 98%, unknown if currently breastfeeding.  Physical Exam:  General: alert, cooperative, and no distress Lochia: appropriate Uterine Fundus: firm Incision: n/a DVT Evaluation: No evidence of DVT seen on physical exam. Negative Homan's sign. No cords or calf tenderness. No significant calf/ankle edema.  Recent Labs    10/29/22 1142 10/30/22 0422  HGB 11.5* 10.2*  HCT 34.0* 30.8*    Assessment/Plan: Plan for discharge tomorrow   LOS: 1 day   Darlene Heritage, DO 10/30/2022, 9:09 AM

## 2022-10-31 MED ORDER — IBUPROFEN 800 MG PO TABS: 800.0000 mg | ORAL_TABLET | Freq: Three times a day (TID) | ORAL | 0 refills | Status: DC

## 2022-10-31 MED ORDER — SENNOSIDES-DOCUSATE SODIUM 8.6-50 MG PO TABS: 2.0000 | ORAL_TABLET | Freq: Every day | ORAL | 0 refills | Status: DC

## 2022-10-31 NOTE — Lactation Note (Signed)
This note was copied from a baby's chart. Lactation Consultation Note  Patient Name: Darlene Livingston [id number] Date: 10/31/2022 Age:30 hours Reason for consult: Follow-up assessment;Early term 37-38.6wks  P3, Baby 37 weeks.  Baby recently breastfed for 15 min and is sleeping STS on mother' chest.  Mother has been pumping (18 ml) and giving baby to baby.  Feed on demand with cues.  Goal 8-12+ times per day after first 24 hrs.  Place baby STS if not cueing.  Reviewed engorgement care and monitoring voids/stools. Feeding Mother's Current Feeding Choice: Breast Milk Nipple Type: Slow - flow Lactation Tools Discussed/Used    Interventions Interventions: Education;DEBP  Discharge Discharge Education: Engorgement and breast care;Warning signs for feeding baby Pump: Stork Pump (given)  Consult Status Consult Status: Complete Date: 10/31/22    [name] [facility] 10/31/2022, 12:54 PM

## 2022-11-01 ENCOUNTER — Encounter: Payer: Medicaid Other | Admitting: Certified Nurse Midwife

## 2022-11-15 ENCOUNTER — Encounter: Payer: Medicaid Other | Admitting: Obstetrics and Gynecology

## 2022-11-23 ENCOUNTER — Telehealth (HOSPITAL_COMMUNITY): Payer: Self-pay

## 2022-11-23 NOTE — Telephone Encounter (Signed)
11/23/2022 1819  Name: Darlene Livingston MRN: [id number] DOB: [birth year]  Reason for Call:  Transition of Care Hospital Discharge Call  Contact Status: Patient Contact Status: Complete  Language assistant needed: Interpreter Mode: Interpreter Not Needed        Follow-Up Questions: Do You Have Any Concerns About Your Health As You Heal From Delivery?: No Do You Have Any Concerns About Your Infants Health?: No  Edinburgh Postnatal Depression Scale:  In the Past 7 Days: I have been able to laugh and see the funny side of things.: As much as I always could I have looked forward with enjoyment to things.: Rather less than I used to I have blamed myself unnecessarily when things went wrong.: Not very often I have been anxious or worried for no good reason.: Yes, sometimes I have felt scared or panicky for no good reason.: No, not at all Things have been getting on top of me.: Yes, sometimes I haven't been coping as well as usual I have been so unhappy that I have had difficulty sleeping.: Not at all I have felt sad or miserable.: No, not at all I have been so unhappy that I have been crying.: No, never The thought of harming myself has occurred to me.: Never Edinburgh Postnatal Depression Scale Total: 6  PHQ2-9 Depression Scale:     Discharge Follow-up: Edinburgh score requires follow up?: No Patient was advised of the following resources:: Support Group, Breastfeeding Support Group  Post-discharge interventions: Reviewed Newborn Safe Sleep Practices  Signature  Signe Colt

## 2022-11-26 ENCOUNTER — Ambulatory Visit: Payer: Medicaid Other | Admitting: Obstetrics and Gynecology

## 2022-12-07 NOTE — Addendum Note (Signed)
Addended by: Desma Mcgregor on: 12/07/2022 02:19 PM   Modules accepted: Orders

## 2022-12-27 ENCOUNTER — Encounter: Payer: Self-pay | Admitting: *Deleted

## 2023-01-03 ENCOUNTER — Telehealth: Payer: Self-pay | Admitting: Clinical

## 2023-01-03 ENCOUNTER — Telehealth: Payer: Self-pay | Admitting: Licensed Clinical Social Worker

## 2023-01-03 NOTE — Telephone Encounter (Signed)
Attempt call, as requested by pt; Unable to leave voice message.

## 2023-01-03 NOTE — Telephone Encounter (Signed)
[facility] contacted patient to provide [facility] intro and to schedule an appointment. [facility] was not able to leave a VM at this time.

## 2023-01-17 ENCOUNTER — Ambulatory Visit (HOSPITAL_COMMUNITY)
Admission: EM | Admit: 2023-01-17 | Discharge: 2023-01-17 | Disposition: A | Discharge status: Home / Self Care | Payer: Medicaid Other | Attending: Emergency Medicine | Admitting: Emergency Medicine

## 2023-01-17 ENCOUNTER — Encounter (HOSPITAL_COMMUNITY): Payer: Self-pay | Admitting: Emergency Medicine

## 2023-01-17 DIAGNOSIS — N390 Urinary tract infection, site not specified: Secondary | ICD-10-CM | POA: Diagnosis present

## 2023-01-17 LAB — POCT URINALYSIS DIP (MANUAL ENTRY)
Bilirubin, UA: NEGATIVE
Glucose, UA: NEGATIVE mg/dL
Ketones, POC UA: NEGATIVE mg/dL
Leukocytes, UA: NEGATIVE
Nitrite, UA: POSITIVE — AB
Protein Ur, POC: NEGATIVE mg/dL
Spec Grav, UA: 1.025 (ref 1.010–1.025)
Urobilinogen, UA: 1 U/dL
pH, UA: 7 (ref 5.0–8.0)

## 2023-01-17 MED ORDER — FLUCONAZOLE 150 MG PO TABS
150.0000 mg | ORAL_TABLET | Freq: Every day | ORAL | 0 refills | Status: DC
Start: 2023-01-17 — End: 2023-03-06

## 2023-01-17 MED ORDER — SULFAMETHOXAZOLE-TRIMETHOPRIM 800-160 MG PO TABS
1.0000 | ORAL_TABLET | Freq: Two times a day (BID) | ORAL | 0 refills | Status: AC
Start: 2023-01-17 — End: 2023-01-22

## 2023-01-17 MED ORDER — ACETAMINOPHEN 500 MG PO TABS
1000.0000 mg | ORAL_TABLET | Freq: Four times a day (QID) | ORAL | Status: AC | PRN
Start: 2023-01-17 — End: ?

## 2023-01-17 NOTE — ED Provider Notes (Signed)
[facility]    CSN: [id number] Arrival date & time: 01/17/23  1649      History   Chief Complaint Chief Complaint  Patient presents with   Vaginal Discharge    HPI Darlene Livingston is a 31 y.o. female.   Patient reporting increased burning with urination.  She does have some vaginal discharge whitish in color.  She is currently breast-feeding newborn of 11 weeks.  She states she is only drinking about 32 ounces of fluids daily.  The history is provided by the patient.  Vaginal Discharge Quality:  Malodorous and milky Severity:  Mild Onset quality:  Gradual Duration:  1 week Timing:  Intermittent Chronicity:  New Associated symptoms: dysuria     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   Headache(784.0)    Miscarriage    x2, one at 13weeks, one at [redacted] weeks gestation   Vaginal pain 06/04/2016   pt reports redundant tissue at introitus cause discomfort and requesting removal- wil sched in office mobile anesthesia of R post introitus    Patient Active Problem List   Diagnosis Date Noted   Normal labor 10/29/2022   Oligohydramnios antepartum 10/10/2022   Anemia in pregnancy, third trimester 08/30/2022   Sciatic leg pain 06/17/2022   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 Authorizing Provider  fluconazole (DIFLUCAN) 150 MG  tablet Take 1 tablet (150 mg total) by mouth daily. 01/17/23  Yes Murphy Duzan, Linde Gillis, NP  sulfamethoxazole-trimethoprim (BACTRIM DS) 800-160 MG tablet Take 1 tablet by mouth 2 (two) times daily for 5 days. 01/17/23 01/22/23 Yes Harol Shabazz, Linde Gillis, NP  acetaminophen (TYLENOL) 500 MG tablet Take 2 tablets (1,000 mg total) by mouth every 6 (six) hours as needed for moderate pain (pain score 4-6) or mild pain (pain score 1-3). 01/17/23   Tj Kitchings, Linde Gillis, NP  Prenatal 28-0.8 MG TABS Take 1 tablet by mouth daily. 03/21/22   Venora Maples, MD  senna-docusate (SENOKOT-S) 8.6-50 MG tablet Take 2 tablets by mouth daily. 11/01/22   Sundra Aland, MD    Family History Family History  Problem Relation Age of Onset   Hypertension Mother    Depression Maternal Aunt    Diabetes Maternal Aunt    Depression Maternal Uncle    Diabetes Maternal Uncle    Asthma Paternal Aunt    Cancer Paternal Aunt    Depression Paternal Aunt    Diabetes Paternal Aunt    Asthma Paternal Uncle    Depression Paternal Uncle    Diabetes Paternal Uncle    Birth defects Cousin    Anesthesia  problems Neg Hx    Hypotension Neg Hx    Malignant hyperthermia Neg Hx    Pseudochol deficiency Neg Hx     Social History Social History   Tobacco Use   Smoking status: Never   Smokeless tobacco: Never  Vaping Use   Vaping status: Never Used  Substance Use Topics   Alcohol use: No    Comment: social   Drug use: Not Currently    Comment: Delta 8 CBD     Allergies   Fruit blend and Other   Review of Systems Review of Systems  Constitutional:  Positive for fatigue.  Genitourinary:  Positive for decreased urine volume, dysuria, frequency and vaginal discharge.       Vaginal itching and burning  All other systems reviewed and are negative.    Physical Exam Triage Vital Signs ED Triage Vitals [01/17/23 1756]  Encounter Vitals Group     BP 116/79     Systolic BP Percentile      Diastolic BP Percentile      Pulse  Rate 65     Resp 16     Temp 98 F (36.7 C)     Temp Source Oral     SpO2 98 %     Weight      Height      Head Circumference      Peak Flow      Pain Score 0     Pain Loc      Pain Education      Exclude from Growth Chart    No data found.  Updated Vital Signs BP 116/79 (BP Location: Right Arm)   Pulse 65   Temp 98 F (36.7 C) (Oral)   Resp 16   LMP 12/20/2022   SpO2 98%   Breastfeeding Unknown   Visual Acuity Right Eye Distance:   Left Eye Distance:   Bilateral Distance:    Right Eye Near:   Left Eye Near:    Bilateral Near:     Physical Exam Vitals and nursing note reviewed.  HENT:     Mouth/Throat:     Mouth: Mucous membranes are dry.  Eyes:     Pupils: Pupils are equal, round, and reactive to light.  Cardiovascular:     Rate and Rhythm: Normal rate and regular rhythm.     Heart sounds: Normal heart sounds.  Abdominal:     General: Abdomen is flat.     Tenderness: There is abdominal tenderness. There is no right CVA tenderness or left CVA tenderness.     Comments: Suprapubic tenderness  Genitourinary:    Comments: Mucoid vaginal discharge reported. Dysuria Neurological:     Mental Status: She is alert and oriented to person, place, and time.      UC Treatments / Results  Labs (all labs ordered are listed, but only abnormal results are displayed) Labs Reviewed  POCT URINALYSIS DIP (MANUAL ENTRY) - Abnormal; Notable for the following components:      Result Value   Clarity, UA cloudy (*)    Blood, UA trace-intact (*)    Nitrite, UA Positive (*)    All other components within normal limits  URINE CULTURE    EKG   Radiology No results found.  Procedures Procedures (including critical care time)  Medications Ordered in UC Medications - No data to display  Initial Impression / Assessment and Plan / UC Course  I have reviewed the triage vital signs and the nursing notes.  Pertinent labs & imaging results that were available during my  care of the patient were reviewed by me and considered in my medical decision making (see chart for details).   UTI symptoms - Dipstick positive for blood and nitrites We will send for culture.  .  Bactrim givenx 5 days.  Hydration is encouraged as she is only drinking 16oz - 32 oz daily.  Follow up with PCP for continued symptoms   Final Clinical Impressions(s) / UC Diagnoses   Final diagnoses:  Urinary tract infection without hematuria, site unspecified     Discharge Instructions      Drink 80oz of water at a minimum daily. Bactrim - 1 tab twice daily for UTI Diflucan for possible yeast. 1 tab only        ED Prescriptions     Medication Sig Dispense Auth. Provider   acetaminophen (TYLENOL) 500 MG tablet Take 2 tablets (1,000 mg total) by mouth every 6 (six) hours as needed for moderate pain (pain score 4-6) or mild pain (pain score 1-3). -- Romario Tith, Linde Gillis, NP   sulfamethoxazole-trimethoprim (BACTRIM DS) 800-160 MG tablet Take 1 tablet by mouth 2 (two) times daily for 5 days. 10 tablet Latrel Szymczak, Linde Gillis, NP   fluconazole (DIFLUCAN) 150 MG tablet Take 1 tablet (150 mg total) by mouth daily. 1 tablet Endiya Klahr, Linde Gillis, NP      PDMP not reviewed this encounter.   Nelda Marseille, NP 01/17/23 [phone]

## 2023-01-17 NOTE — Discharge Instructions (Addendum)
Drink 80oz of water at a minimum daily. Bactrim - 1 tab twice daily for UTI Diflucan for possible yeast. 1 tab only

## 2023-01-17 NOTE — ED Triage Notes (Signed)
Pt reports that her baby recently had oral thrush and was prescribed Nystatin. Pt concerned that she now has a vaginal yeast infection as she has had vaginal irritation, little odor and watery discharge for about 3-4 days. Did try Monastat OTC with no relief.

## 2023-01-19 LAB — URINE CULTURE: Culture: >=100000 — AB

## 2023-01-21 ENCOUNTER — Encounter: Payer: Self-pay | Admitting: Obstetrics & Gynecology

## 2023-01-21 ENCOUNTER — Other Ambulatory Visit: Payer: Self-pay

## 2023-01-21 ENCOUNTER — Ambulatory Visit (INDEPENDENT_AMBULATORY_CARE_PROVIDER_SITE_OTHER): Payer: Medicaid Other | Admitting: Obstetrics & Gynecology

## 2023-01-21 DIAGNOSIS — R87810 Cervical high risk human papillomavirus (HPV) DNA test positive: Secondary | ICD-10-CM | POA: Insufficient documentation

## 2023-01-21 DIAGNOSIS — Z7185 Encounter for immunization safety counseling: Secondary | ICD-10-CM | POA: Diagnosis not present

## 2023-01-21 DIAGNOSIS — Z1331 Encounter for screening for depression: Secondary | ICD-10-CM | POA: Diagnosis not present

## 2023-01-21 DIAGNOSIS — Z3202 Encounter for pregnancy test, result negative: Secondary | ICD-10-CM

## 2023-01-21 LAB — POCT PREGNANCY, URINE: Preg Test, Ur: NEGATIVE

## 2023-01-21 NOTE — Progress Notes (Signed)
Pt interested in Mirena or Nexplanon, not sure.

## 2023-01-21 NOTE — Progress Notes (Signed)
Post Partum Visit Note  Darlene Livingston is a 32 y.o. [phone] female who presents for a postpartum visit. She is  12  weeks postpartum following a normal spontaneous vaginal delivery.  I have fully reviewed the prenatal and intrapartum course. The delivery was at 37/0 gestational weeks.  Anesthesia: epidural. Postpartum course has been uncomplicated. Baby is doing well. Baby is feeding by breast. Bleeding no bleeding. Bowel function is normal. Bladder function is normal. Patient is sexually active. Contraception method is condoms currently, wants to discuss Mirena or Nexplanon. Postpartum depression screening: positive.  The pregnancy intention screening data noted above was reviewed. Potential methods of contraception were discussed. The patient elected to proceed with Nexplanon most likley.   Edinburgh Postnatal Depression Scale - 01/21/23 1629       Edinburgh Postnatal Depression Scale:  In the Past 7 Days   I have been able to laugh and see the funny side of things. 1    I have looked forward with enjoyment to things. 2    I have blamed myself unnecessarily when things went wrong. 0    I have been anxious or worried for no good reason. 1    I have felt scared or panicky for no good reason. 0    Things have been getting on top of me. 1    I have been so unhappy that I have had difficulty sleeping. 1    I have felt sad or miserable. 2    I have been so unhappy that I have been crying. 1    The thought of harming myself has occurred to me. 0    Edinburgh Postnatal Depression Scale Total 9             Health Maintenance Due  Topic Date Due   HPV VACCINES (3 - 3-dose series) 12/12/2016   INFLUENZA VACCINE  08/23/2022   COVID-19 Vaccine (1 - 2024-25 season) Never done    The following portions of the patient's history were reviewed and updated as appropriate: allergies, current medications, past family history, past medical history, past social history, past surgical history,  and problem list.  Review of Systems Pertinent items noted in HPI and remainder of comprehensive ROS otherwise negative.  Objective:  Wt 197 lb 3.2 oz (89.4 kg)   LMP 12/20/2022   Breastfeeding Yes   BMI 31.83 kg/m    General:  cooperative and no distress   Breasts:  Normal, no erythema, done in presence of RN as chaperone  Lungs: Normal breath sounds, no distress  Heart:  regular rate noted  Abdomen: soft, non-tender; bowel sounds normal; no masses,  no organomegaly   GU exam:  not indicated       Assessment:   Postpartum exam with positive depression screening.   Plan:   Essential components of care per ACOG recommendations:  1.  Mood and well being: Patient with positive depression screening today. Reviewed local resources for support.  Patient verbally consented to Integrated Behavioral Health services about presenting concerns. - Ambulatory referral to Integrated Behavioral Health - Patient tobacco use? No.   - hx of drug use? No.    2. Infant care and feeding:  -Patient currently breastmilk feeding? Yes. Reviewed importance of draining breast regularly to support lactation.  -Social determinants of health (SDOH) reviewed in EPIC. No concerns.  3. Sexuality, contraception and birth spacing - Patient does not want a pregnancy in the next year.  Desired family size is 3 children.  -  Reviewed reproductive life planning. Reviewed contraceptive methods based on pt preferences and effectiveness.  Patient desired Hormonal Implant, wants this placed at a later visit. - Discussed birth spacing of 18 months  4. Sleep and fatigue -Encouraged family/partner/community support of 4 hrs of uninterrupted sleep to help with mood and fatigue  5. Physical Recovery  - Discussed patients delivery and complications. She describes her labor as good. - Patient had a Vaginal, no problems at delivery. Patient had no laceration. Perineal healing reviewed. Patient expressed understanding -  Patient has urinary incontinence? No. - Patient is safe to resume physical and sexual activity  6.  Health Maintenance - HM due items addressed Yes - Last pap smear  Diagnosis  Date Value Ref Range Status  06/25/2022   Final   - Negative for intraepithelial lesion or malignancy (NILM)  Positive HRHPV. Needs repeat pap in 07/22/23. Counseled about HPV vaccine, she will get this starting next visit.  -Breast Cancer screening indicated? No.     Jaynie Collins, MD Center for [facility], [facility]

## 2023-01-21 NOTE — Patient Instructions (Signed)
You need to repeat pap smear on or after July 22, 2022

## 2023-01-22 ENCOUNTER — Telehealth: Payer: Self-pay | Admitting: Clinical

## 2023-01-22 NOTE — Telephone Encounter (Signed)
 Attempt call regarding referral; Unable to leave voice message as "call cannot be completed".

## 2023-02-05 ENCOUNTER — Ambulatory Visit: Payer: Medicaid Other | Admitting: Obstetrics and Gynecology

## 2023-02-05 NOTE — Progress Notes (Deleted)
 GYNECOLOGY OFFICE VISIT NOTE  History:   Darlene Livingston is a 33 y.o. [phone] here today for Nexplanon  insertion and initiation of Gardasil series (A three-dose series (0, 1-2, 6 months) for persons who initiate vaccination at ages 51 through 7 years). She denies any abnormal vaginal discharge, bleeding, pelvic pain or other concerns.     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   Headache(784.0)    Miscarriage    x2, one at 13weeks, one at [redacted] weeks gestation   Sciatic leg pain 06/17/2022   [ ]  PT consult     Uterine leiomyoma 01/28/2014   1.8cm R lateral fibroid, repeat scan 05/22/2017: 2.28cm R anterior subserosal  6/24 5 cm     Vaginal pain 06/04/2016   pt reports redundant tissue at introitus cause discomfort and requesting removal- wil sched in office mobile anesthesia of R post introitus    Past Surgical History:  Procedure Laterality Date   THERAPEUTIC ABORTION  07/19/2020   Done at [facility], [redacted] weeks gestation    The following portions of the patient's history were reviewed and updated as appropriate: allergies, current medications, past family history, past medical history, past social history, past surgical history and problem list.   Health Maintenance:  Pap smear: last 06/25/2022 +HRHPV, NILM; 5 year risk of CIN3+ 4.8%; Cotesting in 1 year, 06/2023.  Review of Systems:  Pertinent items noted in HPI and remainder of comprehensive ROS otherwise negative.  Physical Exam:  LMP 12/20/2022  CONSTITUTIONAL: Well-developed, well-nourished female in no acute distress.  HEENT:  Normocephalic, atraumatic. External right and left ear normal. No scleral icterus.  NECK: Normal range of motion, supple, no masses noted on observation SKIN: No rash noted. Not diaphoretic. No erythema. No pallor. MUSCULOSKELETAL: Normal range of motion. No edema noted. NEUROLOGIC: Alert and oriented to  person, place, and time. Normal muscle tone coordination.  PSYCHIATRIC: Normal mood and affect. Normal behavior. Normal judgment and thought content. CARDIOVASCULAR: Normal heart rate noted RESPIRATORY: Effort and breath sounds normal, no problems with respiration noted ABDOMEN: No masses noted. No other overt distention noted.   PELVIC: Deferred  Labs and Imaging No results found for this or any previous visit (from the past week). No results found.    Assessment and Plan:      1. Encounter for counseling regarding contraception (Primary)  2. Nexplanon  insertion  3. 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  Nexplanon  Insertion PROCEDURE NOTE Ms. Darlene Livingston is a 33 y.o. [id number] here for Nexplanon  insertion. No other gynecologic concerns.  Patient was given informed consent, she signed consent form.  Patient does understand that irregular bleeding is a very common side effect of this medication. She was advised to have backup contraception for one week after placement. Pregnancy test in clinic today was negative.  Appropriate time out taken.  Patient's left arm was prepped and draped in the usual sterile fashion.. The ruler used to measure and mark insertion area.  Patient was prepped with alcohol swab and then injected with 3 ml of 1% lidocaine .  She was prepped with betadine, Nexplanon  removed from packaging,  Device confirmed in needle, then inserted full length of needle and withdrawn per handbook instructions. Nexplanon  was able to palpated in  the patient's arm; patient palpated the insert herself. There was minimal blood loss.  Patient insertion site covered with guaze and a pressure bandage to reduce any bruising.  The patient tolerated the procedure well and was given post procedure instructions.    Routine preventative health  maintenance measures emphasized. Please refer to After Visit Summary for other counseling recommendations.   No follow-ups on file.     Mardy Shropshire, MD OB Fellow, Faculty Practice [facility], Center for [facility] 02/05/2023 3:38 PM

## 2023-03-06 ENCOUNTER — Other Ambulatory Visit: Payer: Self-pay

## 2023-03-06 ENCOUNTER — Ambulatory Visit: Payer: Medicaid Other | Admitting: Obstetrics and Gynecology

## 2023-03-06 VITALS — BP 123/80 | HR 71 | Wt 197.0 lb

## 2023-03-06 DIAGNOSIS — Z3201 Encounter for pregnancy test, result positive: Secondary | ICD-10-CM

## 2023-03-06 DIAGNOSIS — Z3009 Encounter for other general counseling and advice on contraception: Secondary | ICD-10-CM | POA: Diagnosis not present

## 2023-03-06 LAB — POCT PREGNANCY, URINE: Preg Test, Ur: POSITIVE — AB

## 2023-03-06 NOTE — Progress Notes (Unsigned)
    GYNECOLOGY VISIT  Patient name: Darlene Livingston MRN [id number]  Date of birth: 12/24/1990 Chief Complaint:   Procedure  History:  Darlene Livingston is a 33 y.o. [id number] being seen today for nexplanon insertion.  Patient interested in nexplanon for contraception.    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   Headache(784.0)    Miscarriage    x2, one at 13weeks, one at [redacted] weeks gestation   Sciatic leg pain 06/17/2022   [ ]  PT consult     Uterine leiomyoma 01/28/2014   1.8cm R lateral fibroid, repeat scan 05/22/2017: 2.28cm R anterior subserosal  6/24 5 cm     Vaginal pain 06/04/2016   pt reports redundant tissue at introitus cause discomfort and requesting removal- wil sched in office mobile anesthesia of R post introitus    Past Surgical History:  Procedure Laterality Date   THERAPEUTIC ABORTION  07/19/2020   Done at [facility], [redacted] weeks gestation    The following portions of the patient's history were reviewed and updated as appropriate: allergies, current medications, past family history, past medical history, past social history, past surgical history and problem list.   Health Maintenance:   Last pap     Component Value Date/Time   DIAGPAP  06/25/2022 1541    - Negative for intraepithelial lesion or malignancy (NILM)   HPVHIGH Positive (A) 06/25/2022 1541   ADEQPAP Satisfactory for evaluation.  The absence of an 06/25/2022 1541   ADEQPAP  06/25/2022 1541    endocervical/transformation zone component is not uncommon in pregnant   ADEQPAP patients. 06/25/2022 1541    High Risk HPV: Positive  Adequacy:  Satisfactory for evaluation, transformation zone component PRESENT  Diagnosis:  Atypical squamous cells of undetermined significance (ASC-US)  Last mammogram: n/a   Review of Systems:  Pertinent items are noted in HPI. Comprehensive review of systems was otherwise  negative.   Objective:  Physical Exam BP 123/80   Pulse 71   Wt 197 lb (89.4 kg)   LMP 01/23/2023 (Within Days)   Breastfeeding Yes   BMI 31.80 kg/m    Physical Exam Vitals and nursing note reviewed.  Constitutional:      Appearance: Normal appearance.  HENT:     Head: Normocephalic and atraumatic.  Pulmonary:     Effort: Pulmonary effort is normal.  Neurological:     General: No focal deficit present.     Mental Status: She is alert.  Psychiatric:        Mood and Affect: Mood normal.        Behavior: Behavior normal.        Thought Content: Thought content normal.        Judgment: Judgment normal.        Assessment & Plan:   1. Encounter for counseling regarding contraception (Primary) Briefly reviewed contraceptive options. Patient to get nexplanon after pregnancy is concluded.   2. Positive pregnancy test Pregnancy test positive today and therefore unable to place nexplanon. Discussed pregnancy options.    Routine preventative health maintenance measures emphasized.  Lorriane Shire, MD Minimally Invasive Gynecologic Surgery Center for [facility], [facility]

## 2023-04-25 NOTE — Progress Notes (Signed)
 Appointment canceled.   Wyn Forster, MD FMOB Fellow, Faculty practice [facility], Center for [facility]

## 2023-07-18 ENCOUNTER — Ambulatory Visit (HOSPITAL_COMMUNITY): Admit: 2023-07-18 | Discharge: 2023-07-18 | Discharge status: Against Medical Advice

## 2023-07-18 NOTE — ED Notes (Signed)
 Pt left due to long wait times she will come back tomorrow

## 2023-07-19 ENCOUNTER — Encounter (HOSPITAL_COMMUNITY): Payer: Self-pay

## 2023-07-19 ENCOUNTER — Ambulatory Visit (HOSPITAL_COMMUNITY): Admission: EM | Admit: 2023-07-19 | Discharge: 2023-07-19 | Disposition: A | Discharge status: Home / Self Care

## 2023-07-19 VITALS — BP 127/86 | HR 76 | Temp 98.2°F | Resp 18

## 2023-07-19 DIAGNOSIS — Z3202 Encounter for pregnancy test, result negative: Secondary | ICD-10-CM

## 2023-07-19 DIAGNOSIS — R1031 Right lower quadrant pain: Secondary | ICD-10-CM | POA: Diagnosis present

## 2023-07-19 DIAGNOSIS — N898 Other specified noninflammatory disorders of vagina: Secondary | ICD-10-CM | POA: Insufficient documentation

## 2023-07-19 LAB — POCT URINALYSIS DIP (MANUAL ENTRY)
Bilirubin, UA: NEGATIVE
Blood, UA: NEGATIVE
Glucose, UA: NEGATIVE mg/dL
Ketones, POC UA: NEGATIVE mg/dL
Leukocytes, UA: NEGATIVE
Nitrite, UA: NEGATIVE
Protein Ur, POC: NEGATIVE mg/dL
Spec Grav, UA: 1.02 (ref 1.010–1.025)
Urobilinogen, UA: 0.2 U/dL
pH, UA: 6 (ref 5.0–8.0)

## 2023-07-19 LAB — POCT URINE PREGNANCY: Preg Test, Ur: NEGATIVE

## 2023-07-19 NOTE — ED Provider Notes (Signed)
 [facility]    CSN: [id number] Arrival date & time: 07/19/23  1115      History   Chief Complaint No chief complaint on file.   HPI Darlene Livingston is a 33 y.o. female.   HPI Patient is a 33 year old female who presents to the urgent care today with concern of some lower abdominal discomfort, occasional burning with urination, and vaginal discharge.  She reports her symptoms began about a week ago.  She does report recently stopping her birth control medication about 3 weeks ago.  She is currently breast-feeding.  She denies any fever, vomiting, diarrhea, blood in the stool, prior abdominal surgeries, rash, or other concerns at this time.  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   Headache(784.0)    Miscarriage    x2, one at 13weeks, one at [redacted] weeks gestation   Sciatic leg pain 06/17/2022   [ ]  PT consult     Uterine leiomyoma 01/28/2014   1.8cm R lateral fibroid, repeat scan 05/22/2017: 2.28cm R anterior subserosal  6/24 5 cm     Vaginal pain 06/04/2016   pt reports redundant tissue at introitus cause discomfort and requesting removal- wil sched in office mobile anesthesia of R post introitus    Patient Active Problem List   Diagnosis Date Noted   Papanicolaou smear of cervix with positive high risk human papilloma virus (HPV) test 01/21/2023   Sciatic leg pain 06/17/2022   Uterine leiomyoma 01/28/2014    Past Surgical History:  Procedure Laterality Date   THERAPEUTIC ABORTION  07/19/2020   Done at [facility], [redacted] weeks gestation    OB History     Gravida  6   Para  3   Term  3   Preterm  0   AB  3   Living  3      SAB  2   IAB  1   Ectopic  0   Multiple  0   Live Births  3            Home Medications    Prior to Admission medications   Medication Sig Start Date End Date Taking? Authorizing Provider  acetaminophen  (TYLENOL ) 500 MG tablet  Take 2 tablets (1,000 mg total) by mouth every 6 (six) hours as needed for moderate pain (pain score 4-6) or mild pain (pain score 1-3). Patient not taking: Reported on 01/21/2023 01/17/23   Sumner Marval HERO, NP    Family History Family History  Problem Relation Age of Onset   Hypertension Mother    Depression Maternal Aunt    Diabetes Maternal Aunt    Depression Maternal Uncle    Diabetes Maternal Uncle    Asthma Paternal Aunt    Cancer Paternal Aunt    Depression Paternal Aunt    Diabetes Paternal Aunt    Asthma Paternal Uncle    Depression Paternal Uncle    Diabetes Paternal Uncle    Birth defects Cousin    Anesthesia problems Neg Hx    Hypotension Neg Hx    Malignant hyperthermia Neg Hx    Pseudochol deficiency Neg Hx     Social History Social History   Tobacco Use   Smoking status: Never   Smokeless tobacco: Never  Vaping Use   Vaping status: Never Used  Substance Use Topics   Alcohol use: No    Comment: social  Drug use: Not Currently    Comment: Delta 8 CBD     Allergies   Fruit blend and Other   Review of Systems Review of Systems See HPI for relevant ROS.  Physical Exam Triage Vital Signs ED Triage Vitals  Encounter Vitals Group     BP 07/19/23 1136 127/86     Girls Systolic BP Percentile --      Girls Diastolic BP Percentile --      Boys Systolic BP Percentile --      Boys Diastolic BP Percentile --      Pulse Rate 07/19/23 1136 76     Resp 07/19/23 1136 18     Temp 07/19/23 1136 98.2 F (36.8 C)     Temp Source 07/19/23 1136 Oral     SpO2 07/19/23 1136 98 %     Weight --      Height --      Head Circumference --      Peak Flow --      Pain Score 07/19/23 1137 0     Pain Loc --      Pain Education --      Exclude from Growth Chart --    No data found.  Updated Vital Signs BP 127/86 (BP Location: Left Arm)   Pulse 76   Temp 98.2 F (36.8 C) (Oral)   Resp 18   LMP 07/13/2023   SpO2 98%   Breastfeeding Yes   Visual  Acuity Right Eye Distance:   Left Eye Distance:   Bilateral Distance:    Right Eye Near:   Left Eye Near:    Bilateral Near:     Physical Exam General: Alert and oriented, well-developed/well-nourished, calm, cooperative, no acute distress HEENT: Normocephalic atraumatic, moist mucous membranes, no scleral icterus, trachea midline Lungs: Speaking full sentences, non-labored respirations, no distress Heart: Regular rate and rhythm Abdomen:  Soft, nondistended, mild tenderness to palpation of the lower central and lower right quadrant, negative Rovsing sign Musculoskeletal: Moves all extremities well Neurologic: Awake, A&O x4, gait normal Integumentary: Warm, dry, normal for ethnicity, intact, no rash Psychiatric: Appropriate mood & affect  UC Treatments / Results  Labs (all labs ordered are listed, but only abnormal results are displayed) Labs Reviewed  POCT URINE PREGNANCY  POCT URINALYSIS DIP (MANUAL ENTRY)  CERVICOVAGINAL ANCILLARY ONLY    EKG   Radiology No results found.  Procedures Procedures (including critical care time)  Medications Ordered in UC Medications - No data to display  Initial Impression / Assessment and Plan / UC Course  I have reviewed the triage vital signs and the nursing notes.  Pertinent labs & imaging results that were available during my care of the patient were reviewed by me and considered in my medical decision making (see chart for details).    Patient presents with lower abdominal discomfort, occasional burning with urination, and vaginal discharge.    Differential diagnosis includes: Acute uncomplicated cystitis, pyelonephritis, nephrolithiasis, ureterolithiasis, pregnancy/ectopic pregnancy, STI, BV, yeast infection, ovarian torsion, ovarian cyst, appendicitis, including other diagnoses.  History obtained from: Patient.  Plan at this time/rationale: UA, hCG, vaginal swab.  All ordered tests including imaging and labs were  independently reviewed and interpreted by myself. Notable findings: UA negative for leukocytes, nitrites, and blood.  hCG negative.  Vaginal swab results pending.  Plan: This patient presents with occasional burning with urination, vaginal discharge, and lower abdominal discomfort.  UA was negative for UTI. No systemic symptoms. Not septic. Well appearing. Low  suspicion for acute pyelonephritis given lack of fever, CVAT, or systemic features. Low suspicion for kidney stone or infected stone. Hcg negative so unlikely for pregnancy/ectopic pregnancy. Low suspicion for ovarian torsion, PID, or appendicitis.  Discussed the limitations of urgent care for assessing abdominal pain and discussed that if she would like more information, she should go to the emergency department.  Will call patient with the results of the vaginal swab and prescribe medication at that time if indicated. Patient should follow up with their PCP in the next several days. Return precautions to the urgent care or emergency department were discussed including if symptoms worsen, they start having any systemic symptoms, severe abdominal pain, vomiting, fever, or if they have any other concerns.  Disposition: Stable to discharge home.   All questions answered to the best of this examiner's ability. Advised to f/u with PCP for further eval and/or reassessment. Patient agrees to plan.  An appropriate evaluation has been performed, and in my medical judgment there is currently no evidence of an immediate life-threatening or surgical condition. Discharge is therefore indicated at this time.  This document was created using the aid of voice recognition Scientist, clinical (histocompatibility and immunogenetics).  Final Clinical Impressions(s) / UC Diagnoses   Final diagnoses:  Vaginal discharge  Abdominal discomfort in right lower quadrant     Discharge Instructions      We have sent your swab off to the lab.  We will call you in the next 3 to 5 days when we get  these results and prescribe any medications at that time if indicated.  We recommend following up with your primary care provider or OB/GYN if symptoms are not improving.  Please go to the emergency department if you have any significant abdominal pain, fever, vomiting, worsening of symptoms, or if you have any other concerns.     ED Prescriptions   None    PDMP not reviewed this encounter.   Melonie Locus, PA-C 07/19/23 1233

## 2023-07-19 NOTE — ED Triage Notes (Signed)
 Patient presents to the office for abdominal pain and vaginal discharge x 3 days.

## 2023-07-19 NOTE — Discharge Instructions (Signed)
 We have sent your swab off to the lab.  We will call you in the next 3 to 5 days when we get these results and prescribe any medications at that time if indicated.  We recommend following up with your primary care provider or OB/GYN if symptoms are not improving.  Please go to the emergency department if you have any significant abdominal pain, fever, vomiting, worsening of symptoms, or if you have any other concerns.

## 2023-07-22 ENCOUNTER — Ambulatory Visit (HOSPITAL_COMMUNITY): Payer: Self-pay

## 2023-07-22 MED ORDER — METRONIDAZOLE 500 MG PO TABS: 500.0000 mg | ORAL_TABLET | Freq: Two times a day (BID) | ORAL | 0 refills | Status: AC

## 2023-07-23 ENCOUNTER — Ambulatory Visit: Admitting: Obstetrics & Gynecology

## 2023-07-23 ENCOUNTER — Other Ambulatory Visit (HOSPITAL_COMMUNITY)
Admission: RE | Admit: 2023-07-23 | Discharge: 2023-07-23 | Disposition: A | Discharge status: Home / Self Care | Source: Ambulatory Visit | Attending: Obstetrics & Gynecology | Admitting: Obstetrics & Gynecology

## 2023-07-23 ENCOUNTER — Encounter: Payer: Self-pay | Admitting: Obstetrics & Gynecology

## 2023-07-23 ENCOUNTER — Other Ambulatory Visit: Payer: Self-pay

## 2023-07-23 VITALS — BP 143/80 | HR 79 | Wt 190.9 lb

## 2023-07-23 DIAGNOSIS — Z23 Encounter for immunization: Secondary | ICD-10-CM

## 2023-07-23 DIAGNOSIS — R87810 Cervical high risk human papillomavirus (HPV) DNA test positive: Secondary | ICD-10-CM

## 2023-07-23 DIAGNOSIS — Z30017 Encounter for initial prescription of implantable subdermal contraceptive: Secondary | ICD-10-CM | POA: Diagnosis not present

## 2023-07-23 LAB — CERVICOVAGINAL ANCILLARY ONLY
Bacterial Vaginitis (gardnerella): POSITIVE — AB
Candida Glabrata: NEGATIVE
Candida Vaginitis: NEGATIVE
Chlamydia: NEGATIVE
Comment: NEGATIVE
Comment: NEGATIVE
Comment: NEGATIVE
Comment: NEGATIVE
Comment: NEGATIVE
Comment: NORMAL
Neisseria Gonorrhea: NEGATIVE
Trichomonas: NEGATIVE

## 2023-07-23 NOTE — Progress Notes (Unsigned)
 GYNECOLOGY OFFICE PROCEDURE NOTE  Darlene Livingston is a 33 y.o. [phone] here for Nexplanon insertion.  Last pap smear was on 06/2022 and was positive HR HPV needs repeat today.  No other gynecologic concerns.  Nexplanon Insertion Procedure Patient identified, informed consent performed, consent signed.   Patient does understand that irregular bleeding is a very common side effect of this medication. She was advised to have backup contraception for one week after placement. Pregnancy test in clinic today was negative.  Appropriate time out taken.  Patient's left arm was prepped and draped in the usual sterile fashion. The ruler used to measure and mark insertion area.  Patient was prepped with alcohol swab and then injected with 3 ml of 1% lidocaine .  She was prepped with betadine, Nexplanon removed from packaging,  Device confirmed in needle, then inserted full length of needle and withdrawn per handbook instructions. Nexplanon was able to palpated in the patient's arm; patient palpated the insert herself. There was minimal blood loss.  Patient insertion site covered with gauze and a pressure bandage to reduce any bruising.  The patient tolerated the procedure well and was given post procedure instructions.    Eveline Lynwood MATSU, MD Attending Obstetrician & Gynecologist, [facility] and Center for [facility]  07/23/2023

## 2023-07-23 NOTE — Patient Instructions (Signed)
Human Papillomavirus (HPV) Vaccine Injection What is this medication? HUMAN PAPILLOMAVIRUS VACCINE (HYOO muhn pap uh LOH muh vahy ruhs vak SEEN) reduces the risk of human papillomavirus (HPV). It does not treat HPV. It is still possible to get HPV after receiving this vaccine, but the symptoms may be less severe or not last as long. It works by helping your immune system learn how to fight off a future infection. This medicine may be used for other purposes; ask your health care provider or pharmacist if you have questions. COMMON BRAND NAME(S): Gardasil 9 What should I tell my care team before I take this medication? They need to know if you have any of these conditions: Fever Hemophilia HIV or AIDS Immune system problems Infection Low platelets An unusual reaction to human papillomavirus vaccine, yeast, other vaccines, other medications, foods, dyes, or preservatives Pregnant or trying to get pregnant Breastfeeding How should I use this medication? This vaccine is injected into a muscle. It is given by your care team. This vaccine requires 2 or 3 doses to get the full benefit. Set a reminder for when your next dose is due. A copy of the Vaccine Information Statement will be given before each vaccination. Be sure to read this information carefully each time. This sheet may change often. Talk to your care team about the use of this medication in children. While it may be prescribed for children as young as 9 years for selected conditions, precautions do apply. Overdosage: If you think you have taken too much of this medicine contact a poison control center or emergency room at once. NOTE: This medicine is only for you. Do not share this medicine with others. What if I miss a dose? Keep appointments for follow-up doses as directed. It is important not to miss your dose. Call your care team if you are unable to keep an appointment. What may interact with this medication? Certain medications  for arthritis Medications for organ transplant Medications to treat cancer Steroid medications, such as prednisone or cortisone This list may not describe all possible interactions. Give your health care provider a list of all the medicines, herbs, non-prescription drugs, or dietary supplements you use. Also tell them if you smoke, drink alcohol, or use illegal drugs. Some items may interact with your medicine. What should I watch for while using this medication? Visit your care team regularly. Report any side effects to your care team right away. This vaccine, like all vaccines, may not fully protect everyone. What side effects may I notice from receiving this medication? Side effects that you should report to your care team as soon as possible: Allergic reactions--skin rash, itching, hives, swelling of the face, lips, tongue, or throat Feeling faint or lightheaded Side effects that usually do not require medical attention (report these to your care team if they continue or are bothersome): Diarrhea Dizziness Fatigue Fever Headache Nausea Pain, redness, irritation, or bruising at the injection site This list may not describe all possible side effects. Call your doctor for medical advice about side effects. You may report side effects to FDA at 1-800-FDA-1088. Where should I keep my medication? This vaccine is only given by your care team. It will not be stored at home. NOTE: This sheet is a summary. It may not cover all possible information. If you have questions about this medicine, talk to your doctor, pharmacist, or health care provider.  2024 Elsevier/Gold Standard (2021-06-21 00:00:00)

## 2023-07-23 NOTE — Progress Notes (Unsigned)
 GYNECOLOGY CLINIC ANNUAL PREVENTATIVE CARE ENCOUNTER NOTE  Subjective:   Darlene Livingston is a 33 y.o. [phone] female here for repeat pap test.  Current complaints: s/p Nexplanon, requests Gardasil.   Denies abnormal vaginal bleeding, discharge, pelvic pain, problems with intercourse or other gynecologic concerns.    Gynecologic History Patient's last menstrual period was 07/13/2023. Contraception: OCP (estrogen/progesterone) and Nexplanon Last Pap: 06/200. Results were: abnormal   Obstetric History OB History  Gravida Para Term Preterm AB Living  7 3 3  0 4 3  SAB IAB Ectopic Multiple Live Births  2 2 0 0 3    # Outcome Date GA Lbr Len/2nd Weight Sex Type Anes PTL Lv  7 IAB 02/2023          6 Term 10/29/22 [redacted]w[redacted]d 04:14 / 00:10 5 lb 13.1 oz (2.64 kg) F Vag-Spont EPI  LIV  5 IAB 07/19/20 [redacted]w[redacted]d    TAB     4 Term 08/31/14 [redacted]w[redacted]d 11:06 / 00:16 6 lb 9.3 oz (2.985 kg) F Vag-Spont EPI  LIV  3 Term 02/22/12 [redacted]w[redacted]d 13:06 / 00:18 6 lb 2.9 oz (2.805 kg) F Vag-Spont EPI  LIV     Birth Comments: WNL  2 SAB           1 SAB             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   Headache(784.0)    Miscarriage    x2, one at 13weeks, one at [redacted] weeks gestation   Sciatic leg pain 06/17/2022   [ ]  PT consult     Uterine leiomyoma 01/28/2014   1.8cm R lateral fibroid, repeat scan 05/22/2017: 2.28cm R anterior subserosal  6/24 5 cm     Vaginal pain 06/04/2016   pt reports redundant tissue at introitus cause discomfort and requesting removal- wil sched in office mobile anesthesia of R post introitus    Past Surgical History:  Procedure Laterality Date   THERAPEUTIC ABORTION  07/19/2020   Done at [facility], [redacted] weeks gestation    Current Outpatient Medications on File Prior to Visit  Medication Sig Dispense Refill   metroNIDAZOLE  (FLAGYL ) 500 MG tablet Take 1 tablet (500 mg total) by mouth 2 (two) times daily  for 7 days. 14 tablet 0   acetaminophen  (TYLENOL ) 500 MG tablet Take 2 tablets (1,000 mg total) by mouth every 6 (six) hours as needed for moderate pain (pain score 4-6) or mild pain (pain score 1-3). (Patient not taking: Reported on 01/21/2023)     No current facility-administered medications on file prior to visit.    Allergies  Allergen Reactions   Fruit Blend Other (See Comments)    Sore throat from eating acidic fresh fruits   Other Other (See Comments)    Sore throat from eating acidic fresh fruits    Social History   Socioeconomic History   Marital status: Single    Spouse name: Not on file   Number of children: Not on file   Years of education: Not on file   Highest education level: Not on file  Occupational History   Not on file  Tobacco Use   Smoking status: Never   Smokeless tobacco: Never  Vaping Use   Vaping status: Never Used  Substance and Sexual Activity   Alcohol use: No    Comment: social   Drug use: Not Currently  Comment: Delta 8 CBD   Sexual activity: Not Currently    Partners: Male    Birth control/protection: None  Other Topics Concern   Not on file  Social History Narrative   Not on file   Social Drivers of Health   Financial Resource Strain: Not on file  Food Insecurity: Not on file  Transportation Needs: Not on file  Physical Activity: Not on file  Stress: Not on file  Social Connections: Not on file  Intimate Partner Violence: Not on file    Family History  Problem Relation Age of Onset   Hypertension Mother    Depression Maternal Aunt    Diabetes Maternal Aunt    Depression Maternal Uncle    Diabetes Maternal Uncle    Asthma Paternal Aunt    Cancer Paternal Aunt    Depression Paternal Aunt    Diabetes Paternal Aunt    Asthma Paternal Uncle    Depression Paternal Uncle    Diabetes Paternal Uncle    Birth defects Cousin    Anesthesia problems Neg Hx    Hypotension Neg Hx    Malignant hyperthermia Neg Hx    Pseudochol  deficiency Neg Hx     The following portions of the patient's history were reviewed and updated as appropriate: allergies, current medications, past family history, past medical history, past social history, past surgical history and problem list.  Review of Systems Pertinent items are noted in HPI.   Objective:  BP (!) 143/80   Pulse 79   Wt 190 lb 14.4 oz (86.6 kg)   LMP 07/13/2023   Breastfeeding Yes   BMI 30.81 kg/m  CONSTITUTIONAL: Well-developed, well-nourished female in no acute distress.  HENT:  Normocephalic, atraumatic, External right and left ear normal. Oropharynx is clear and moist EYES: Conjunctivae and EOM are normal. Pupils are equal, round, and reactive to light. No scleral icterus.  NECK: Normal range of motion, supple, no masses.  Normal thyroid.  SKIN: Skin is warm and dry. No rash noted. Not diaphoretic. No erythema. No pallor. NEUROLGIC: Alert and oriented to person, place, and time. Normal reflexes, muscle tone coordination. No cranial nerve deficit noted. PSYCHIATRIC: Normal mood and affect. Normal behavior. Normal judgment and thought content. CARDIOVASCULAR: Normal heart rate noted, regular rhythm RESPIRATORY:Effort normal, no problems with respiration noted. ABDOMEN: No distention noted.  PELVIC: Normal appearing external genitalia; normal appearing vaginal mucosa and cervix.  No abnormal discharge noted.  Pap smear obtained.  . MUSCULOSKELETAL: Normal range of motion. No tenderness.  No cyanosis, clubbing, or edema.    Assessment:  Annual gynecologic examination with pap smear   Plan:  Will follow up results of pap smear and manage accordingly. Gardasil given and repeat at 2 and 6 months Routine preventative health maintenance measures emphasized. Please refer to After Visit Summary for other counseling recommendations.    LYNWOOD SOLOMONS, MD Attending Obstetrician & Gynecologist Center for [employer], [facility]

## 2023-08-05 ENCOUNTER — Ambulatory Visit: Payer: Self-pay | Admitting: Obstetrics & Gynecology

## 2023-08-05 LAB — CYTOLOGY - PAP
Comment: NEGATIVE
Diagnosis: NEGATIVE
High risk HPV: NEGATIVE

## 2023-09-03 ENCOUNTER — Telehealth: Admitting: Family Medicine

## 2023-09-03 DIAGNOSIS — R3989 Other symptoms and signs involving the genitourinary system: Secondary | ICD-10-CM

## 2023-09-03 MED ORDER — NITROFURANTOIN MONOHYD MACRO 100 MG PO CAPS
100.0000 mg | ORAL_CAPSULE | Freq: Two times a day (BID) | ORAL | 0 refills | Status: AC
Start: 2023-09-03 — End: 2023-09-08

## 2023-09-03 NOTE — Patient Instructions (Signed)
  Lonny BIRCH Bowersox, thank you for joining Olam DELENA Darby, FNP for today's virtual visit.  While this provider is not your primary care provider (PCP), if your PCP is located in our provider database this encounter information will be shared with them immediately following your visit.   A [facility] MyChart account gives you access to today's visit and all your visits, tests, and labs performed at [facility]  click here if you don't have a [facility] MyChart account or go to [email or web address]  Consent: (Patient) Darlene Livingston provided verbal consent for this virtual visit at the beginning of the encounter.  Current Medications:  Current Outpatient Medications:    nitrofurantoin , macrocrystal-monohydrate, (MACROBID ) 100 MG capsule, Take 1 capsule (100 mg total) by mouth 2 (two) times daily for 5 days., Disp: 10 capsule, Rfl: 0   acetaminophen  (TYLENOL ) 500 MG tablet, Take 2 tablets (1,000 mg total) by mouth every 6 (six) hours as needed for moderate pain (pain score 4-6) or mild pain (pain score 1-3). (Patient not taking: Reported on 01/21/2023), Disp: , Rfl:    Medications ordered in this encounter:  Meds ordered this encounter  Medications   nitrofurantoin , macrocrystal-monohydrate, (MACROBID ) 100 MG capsule    Sig: Take 1 capsule (100 mg total) by mouth 2 (two) times daily for 5 days.    Dispense:  10 capsule    Refill:  0    Supervising Provider:   BLAISE ALEENE KIDD [[id number]]     *If you need refills on other medications prior to your next appointment, please contact your pharmacy*  Follow-Up: Call back or seek an in-person evaluation if the symptoms worsen or if the condition fails to improve as anticipated.  [facility] [phone]  Other Instructions Start antibiotic today, discussed its twice daily. May take first dose between now and noon and then can take second dose around 10pm.  Otherwise take 1 dose tonight and then start taking  twice daily tomorrow 1 in the morning and 1 at night. Discussed since breast-feeding twice daily she should breast-feed first and then take her antibiotic to minimize crossover into breastmilk. Handout provided in AVS for Macrobid  and UTI Discussed risks and benefits of taking antibiotic while breast-feeding.  Discussed signs and symptoms to look out for in baby including diarrhea or GI upset.  Advised to notify us , or PCP, and her pediatrician as soon as possible if symptoms were to start in baby.  If you have been instructed to have an in-person evaluation today at a local Urgent Care facility, please use the link below. It will take you to a list of all of our available [facility], including address, phone number and hours of operation. Please do not delay care.  [facility]  If you or a family member do not have a primary care provider, use the link below to schedule a visit and establish care. When you choose a [facility] primary care physician or advanced practice provider, you gain a long-term partner in health. Find a Primary Care Provider  Learn more about [facility]'s in-office and virtual care options: [place] - Get Care Now

## 2023-09-03 NOTE — Progress Notes (Signed)
 Virtual Visit Consent   Darlene Livingston, you are scheduled for a virtual visit with a [facility] provider today. Just as with appointments in the office, your consent must be obtained to participate. Your consent will be active for this visit and any virtual visit you may have with one of our providers in the next 365 days. If you have a MyChart account, a copy of this consent can be sent to you electronically.  As this is a virtual visit, video technology does not allow for your provider to perform a traditional examination. This may limit your provider's ability to fully assess your condition. If your provider identifies any concerns that need to be evaluated in person or the need to arrange testing (such as labs, EKG, etc.), we will make arrangements to do so. Although advances in technology are sophisticated, we cannot ensure that it will always work on either your end or our end. If the connection with a video visit is poor, the visit may have to be switched to a telephone visit. With either a video or telephone visit, we are not always able to ensure that we have a secure connection.  By engaging in this virtual visit, you consent to the provision of healthcare and authorize for your insurance to be billed (if applicable) for the services provided during this visit. Depending on your insurance coverage, you may receive a charge related to this service.  I need to obtain your verbal consent now. Are you willing to proceed with your visit today? Darlene Livingston has provided verbal consent on 09/03/2023 for a virtual visit (video or telephone). Darlene Darlene Darby, FNP  Date: 09/03/2023 10:03 AM   Virtual Visit via Video Note   I, Darlene Livingston, connected with  Darlene Livingston  ([id number], 1990-10-05) on 09/03/23 at  9:45 AM EDT by a video-enabled telemedicine application and verified that I am speaking with the correct person using two identifiers.  Location: Patient: Virtual Visit Location  Patient: Mobile Provider: Virtual Visit Location Provider: Home Office   I discussed the limitations of evaluation and management by telemedicine and the availability of in person appointments. The patient expressed understanding and agreed to proceed.    History of Present Illness: Darlene Livingston is a 33 y.o. female, and is being seen today for urgency with urination. Starting 3 days ago. Really busy with her daughter and didn't have time to make a visit. Frequency and urgency of urination. Denies low back pain or fevers. Has had UTI in the past many years ago. Feels similar to before. Pain with urination. No vaginal pain or discharge. No odor. Able to pass urine.  No history of pyelonephritis. Currently breast feeding twice daily.  HPI:  Problems:  Patient Active Problem List   Diagnosis Date Noted   Papanicolaou smear of cervix with positive high risk human papilloma virus (HPV) test 01/21/2023   Sciatic leg pain 06/17/2022   Uterine leiomyoma 01/28/2014    Allergies:  Allergies  Allergen Reactions   Fruit Blend Other (See Comments)    Sore throat from eating acidic fresh fruits   Other Other (See Comments)    Sore throat from eating acidic fresh fruits   Medications:  Current Outpatient Medications:    nitrofurantoin , macrocrystal-monohydrate, (MACROBID ) 100 MG capsule, Take 1 capsule (100 mg total) by mouth 2 (two) times daily for 5 days., Disp: 10 capsule, Rfl: 0   acetaminophen  (TYLENOL ) 500 MG tablet, Take 2 tablets (1,000 mg total) by  mouth every 6 (six) hours as needed for moderate pain (pain score 4-6) or mild pain (pain score 1-3). (Patient not taking: Reported on 01/21/2023), Disp: , Rfl:   Observations/Objective: Patient is well-developed, well-nourished in no acute distress.  Resting comfortably at home.  Head is normocephalic, atraumatic.  No labored breathing.  Speech is clear and coherent with logical content.  Patient is alert and oriented at baseline.    Assessment and Plan: 1. Suspected UTI (Primary) - nitrofurantoin , macrocrystal-monohydrate, (MACROBID ) 100 MG capsule; Take 1 capsule (100 mg total) by mouth 2 (two) times daily for 5 days.  Dispense: 10 capsule; Refill: 0  Start antibiotic today, discussed its twice daily. May take first dose between now and noon and then can take second dose around 10pm.  Otherwise take 1 dose tonight and then start taking twice daily tomorrow 1 in the morning and 1 at night. Discussed since breast-feeding twice daily she should breast-feed first and then take her antibiotic to minimize crossover into breastmilk. Handout provided in AVS for Macrobid  and UTI Discussed risks and benefits of taking antibiotic while breast-feeding.  Discussed signs and symptoms to look out for in baby including diarrhea or GI upset.  Advised to notify us , or PCP, and her pediatrician as soon as possible if symptoms were to start in baby.  Follow Up Instructions: I discussed the assessment and treatment plan with the patient. The patient was provided an opportunity to ask questions and all were answered. The patient agreed with the plan and demonstrated an understanding of the instructions.  A copy of instructions were sent to the patient via MyChart unless otherwise noted below.   The patient was advised to call back or seek an in-person evaluation if the symptoms worsen or if the condition fails to improve as anticipated.    Darlene Darlene Darby, FNP

## 2023-09-24 ENCOUNTER — Encounter: Payer: Self-pay | Admitting: *Deleted

## 2023-09-24 ENCOUNTER — Ambulatory Visit

## 2023-09-27 ENCOUNTER — Telehealth: Admitting: Physician Assistant

## 2023-09-27 DIAGNOSIS — R3989 Other symptoms and signs involving the genitourinary system: Secondary | ICD-10-CM

## 2023-09-27 DIAGNOSIS — R3915 Urgency of urination: Secondary | ICD-10-CM

## 2023-09-27 MED ORDER — SULFAMETHOXAZOLE-TRIMETHOPRIM 800-160 MG PO TABS
1.0000 | ORAL_TABLET | Freq: Two times a day (BID) | ORAL | 0 refills | Status: AC
Start: 2023-09-27 — End: ?

## 2023-09-27 NOTE — Progress Notes (Signed)
 Virtual Visit Consent   Darlene Livingston, you are scheduled for a virtual visit with a  provider today. Just as with appointments in the office, your consent must be obtained to participate. Your consent will be active for this visit and any virtual visit you may have with one of our providers in the next 365 days. If you have a MyChart account, a copy of this consent can be sent to you electronically.  As this is a virtual visit, video technology does not allow for your provider to perform a traditional examination. This may limit your provider's ability to fully assess your condition. If your provider identifies any concerns that need to be evaluated in person or the need to arrange testing (such as labs, EKG, etc.), we will make arrangements to do so. Although advances in technology are sophisticated, we cannot ensure that it will always work on either your end or our end. If the connection with a video visit is poor, the visit may have to be switched to a telephone visit. With either a video or telephone visit, we are not always able to ensure that we have a secure connection.  By engaging in this virtual visit, you consent to the provision of healthcare and authorize for your insurance to be billed (if applicable) for the services provided during this visit. Depending on your insurance coverage, you may receive a charge related to this service.  I need to obtain your verbal consent now. Are you willing to proceed with your visit today? Darlene Livingston has provided verbal consent on 09/27/2023 for a virtual visit (video or telephone). Darlene CHRISTELLA Dickinson, PA-C  Date: 09/27/2023 6:07 PM   Virtual Visit via Video Note   I, Darlene Livingston, connected with  Darlene Livingston  ([id number], 1990-06-12) on 09/27/23 at  5:45 PM EDT by a video-enabled telemedicine application and verified that I am speaking with the correct person using two identifiers.  Location: Patient: Virtual Visit  Location Patient: Home Provider: Virtual Visit Location Provider: Home Office   I discussed the limitations of evaluation and management by telemedicine and the availability of in person appointments. The patient expressed understanding and agreed to proceed.    History of Present Illness: Darlene Livingston is a 33 y.o. who identifies as a female who was assigned female at birth, and is being seen today for dysuria.  HPI: Urinary Tract Infection  This is a new problem. The current episode started 1 to 4 weeks ago (3 weeks ago (09/03/23) was seen virtually and prescribed Macrobid ; Burning with urination improved but urgency and urine incontinencehas remained unchanged). The problem occurs every urination. The problem has been unchanged. The patient is experiencing no pain. There has been no fever. There is No history of pyelonephritis. Associated symptoms include frequency and urgency. Pertinent negatives include no chills, discharge, flank pain, hematuria, hesitancy, nausea or possible pregnancy. She has tried antibiotics (Macrobid ) for the symptoms. The treatment provided mild relief. Her past medical history is significant for recurrent UTIs.     Problems:  Patient Active Problem List   Diagnosis Date Noted   Papanicolaou smear of cervix with positive high risk human papilloma virus (HPV) test 01/21/2023   Sciatic leg pain 06/17/2022   Uterine leiomyoma 01/28/2014    Allergies:  Allergies  Allergen Reactions   Fruit Blend Other (See Comments)    Sore throat from eating acidic fresh fruits   Other Other (See Comments)    Sore throat from  eating acidic fresh fruits   Medications:  Current Outpatient Medications:    sulfamethoxazole -trimethoprim  (BACTRIM  DS) 800-160 MG tablet, Take 1 tablet by mouth 2 (two) times daily., Disp: 10 tablet, Rfl: 0   acetaminophen  (TYLENOL ) 500 MG tablet, Take 2 tablets (1,000 mg total) by mouth every 6 (six) hours as needed for moderate pain (pain score  4-6) or mild pain (pain score 1-3). (Patient not taking: Reported on 01/21/2023), Disp: , Rfl:   Observations/Objective: Patient is well-developed, well-nourished in no acute distress.  Resting comfortably at home.  Head is normocephalic, atraumatic.  No labored breathing.  Speech is clear and coherent with logical content.  Patient is alert and oriented at baseline.    Assessment and Plan: 1. Suspected UTI (Primary) - sulfamethoxazole -trimethoprim  (BACTRIM  DS) 800-160 MG tablet; Take 1 tablet by mouth 2 (two) times daily.  Dispense: 10 tablet; Refill: 0  2. Urinary urgency  - Worsening symptoms.  - Will treat empirically with Bactrim  - May use AZO for bladder spasms - Continue to push fluids.  - Did discuss potential that this may not be a UTI, but may be pelvic floor dysfunction from her pregnancy and delivery last year. She is scheduled to see her OB/GYN in 2 weeks and reports she will discuss with them and have them obtain a urine culture if symptoms persist - Seek in person evaluation for urine culture if symptoms do not improve or if they worsen.    Follow Up Instructions: I discussed the assessment and treatment plan with the patient. The patient was provided an opportunity to ask questions and all were answered. The patient agreed with the plan and demonstrated an understanding of the instructions.  A copy of instructions were sent to the patient via MyChart unless otherwise noted below.    The patient was advised to call back or seek an in-person evaluation if the symptoms worsen or if the condition fails to improve as anticipated.    Darlene CHRISTELLA Dickinson, PA-C

## 2023-09-27 NOTE — Patient Instructions (Signed)
 Darlene Livingston, thank you for joining Delon CHRISTELLA Dickinson, PA-C for today's virtual visit.  While this provider is not your primary care provider (PCP), if your PCP is located in our provider database this encounter information will be shared with them immediately following your visit.   A [facility] MyChart account gives you access to today's visit and all your visits, tests, and labs performed at [facility]  click here if you don't have a [facility] MyChart account or go to [email or web address]  Consent: (Patient) Darlene Livingston provided verbal consent for this virtual visit at the beginning of the encounter.  Current Medications:  Current Outpatient Medications:    sulfamethoxazole -trimethoprim  (BACTRIM  DS) 800-160 MG tablet, Take 1 tablet by mouth 2 (two) times daily., Disp: 10 tablet, Rfl: 0   acetaminophen  (TYLENOL ) 500 MG tablet, Take 2 tablets (1,000 mg total) by mouth every 6 (six) hours as needed for moderate pain (pain score 4-6) or mild pain (pain score 1-3). (Patient not taking: Reported on 01/21/2023), Disp: , Rfl:    Medications ordered in this encounter:  Meds ordered this encounter  Medications   sulfamethoxazole -trimethoprim  (BACTRIM  DS) 800-160 MG tablet    Sig: Take 1 tablet by mouth 2 (two) times daily.    Dispense:  10 tablet    Refill:  0    Supervising Provider:   BLAISE ALEENE KIDD [[id number]]     *If you need refills on other medications prior to your next appointment, please contact your pharmacy*  Follow-Up: Call back or seek an in-person evaluation if the symptoms worsen or if the condition fails to improve as anticipated.  [facility] [phone]  Other Instructions  Pelvic Floor Dysfunction, Female  Pelvic floor dysfunction (PFD) is a condition that results when the group of muscles and connective tissues that support the organs in the pelvis (pelvic floor muscles) do not work well. These muscles and their  connections form a sling that supports the colon and bladder. In women, they also support the uterus. PFD causes pelvic floor muscles to be too weak, too tight, or both. In PFD, muscle movements are not coordinated. This may cause bowel or bladder problems. It may also cause pain. What are the causes? This condition may be caused by an injury to the pelvic area or by a weakening of pelvic muscles. This often results from pregnancy and childbirth or other types of strain. In many cases, the exact cause is not known. What increases the risk? The following factors may make you more likely to develop this condition: Having chronic bladder tissue inflammation (interstitial cystitis). Being an older person. Being overweight. History of radiation treatment for cancer in the pelvic region. Previous pelvic surgery, such as removal of the uterus (hysterectomy). What are the signs or symptoms? Symptoms of this condition vary and may include: Bladder symptoms, such as: Trouble starting urination and emptying the bladder. Frequent urinary tract infections. Leaking urine when coughing, laughing, or exercising (stress incontinence). Having to pass urine urgently or frequently. Pain when passing urine. Bowel symptoms, such as: Constipation. Urgent or frequent bowel movements. Incomplete bowel movements. Painful bowel movements. Leaking stool or gas. Unexplained genital or rectal pain. Genital or rectal muscle spasms. Low back pain. Other symptoms may include: A heavy, full, or aching feeling in the vagina. A bulge that protrudes into the vagina. Pain during or after sex. How is this diagnosed? This condition may be diagnosed based on: Your symptoms and medical history. A physical exam.  During the exam, your health care provider may check your pelvic muscles for tightness, spasm, pain, or weakness. This may include a rectal exam and a pelvic exam. In some cases, you may have diagnostic tests, such  as: Electrical muscle function tests. Urine flow testing. X-ray tests of bowel function. Ultrasound of the pelvic organs. How is this treated? Treatment for this condition depends on the symptoms. Treatment options include: Physical therapy. This may include Kegel exercises to help relax or strengthen the pelvic floor muscles. Biofeedback. This type of therapy provides feedback on how tight your pelvic floor muscles are so that you can learn to control them. Internal or external massage therapy. A treatment that involves electrical stimulation of the pelvic floor muscles to help control pain (transcutaneous electrical nerve stimulation, or TENS). Sound wave therapy (ultrasound) to reduce muscle spasms. Medicines, such as: Muscle relaxants. Bladder control medicines. Surgery to reconstruct or support pelvic floor muscles may be an option if other treatments do not help. Follow these instructions at home: Activity Do your usual activities as told by your health care provider. Ask your health care provider if you should modify any activities. Do pelvic floor strengthening or relaxing exercises at home as told by your physical therapist. Lifestyle Maintain a healthy weight. Eat foods that are high in fiber, such as beans, whole grains, and fresh fruits and vegetables. Limit foods that are high in fat and processed sugars, such as fried or sweet foods. Manage stress with relaxation techniques such as yoga or meditation. General instructions If you have problems with leakage: Use absorbable pads or wear padded underwear. Wash frequently with mild soap. Keep your genital and anal area as clean and dry as possible. Ask your health care provider if you should try a barrier cream to prevent skin irritation. Take warm baths to relieve pelvic muscle tension or spasms. Take over-the-counter and prescription medicines only as told by your health care provider. Keep all follow-up visits. How is  this prevented? The cause of PFD is not always known, but there are a few things you can do to reduce the risk of developing this condition, including: Staying at a healthy weight. Getting regular exercise. Managing stress. Contact a health care provider if: Your symptoms are not improving with home care. You have signs or symptoms of PFD that get worse at home. You develop new signs or symptoms. You have signs of a urinary tract infection, such as: Fever. Chills. Increased urinary frequency. A burning feeling when urinating. You have not had a bowel movement in 3 days (constipation). Summary Pelvic floor dysfunction results when the muscles and connective tissues in your pelvic floor do not work well. These muscles and their connections form a sling that supports your colon and bladder. In women, they also support the uterus. PFD may be caused by an injury to the pelvic area or by a weakening of pelvic muscles. PFD causes pelvic floor muscles to be too weak, too tight, or a combination of both. Symptoms may vary from person to person. In most cases, PFD can be treated with physical therapies and medicines. Surgery may be an option if other treatments do not help. This information is not intended to replace advice given to you by your health care provider. Make sure you discuss any questions you have with your health care provider. Document Revised: 05/18/2020 Document Reviewed: 05/18/2020 Elsevier Patient Education  2024 [employer].   If you have been instructed to have an in-person evaluation today at  a local Urgent Care facility, please use the link below. It will take you to a list of all of our available [facility], including address, phone number and hours of operation. Please do not delay care.  [facility]  If you or a family member do not have a primary care provider, use the link below to schedule a visit and establish care. When you choose a [facility] primary care physician or advanced practice provider, you gain a long-term partner in health. Find a Primary Care Provider  Learn more about [facility] in-office and virtual care options: [facility] - Get Care Now

## 2023-10-09 ENCOUNTER — Ambulatory Visit
